# Patient Record
Sex: Female | Born: 1970 | Hispanic: No | Smoking: Former smoker
Health system: Southern US, Community
[De-identification: ages and names within clinical notes are randomized; demographics above are authoritative.]

## PROBLEM LIST (undated history)

## (undated) DIAGNOSIS — F329 Major depressive disorder, single episode, unspecified: Secondary | ICD-10-CM

## (undated) DIAGNOSIS — B019 Varicella without complication: Secondary | ICD-10-CM

## (undated) DIAGNOSIS — F419 Anxiety disorder, unspecified: Secondary | ICD-10-CM

## (undated) DIAGNOSIS — I2510 Atherosclerotic heart disease of native coronary artery without angina pectoris: Secondary | ICD-10-CM

## (undated) DIAGNOSIS — I519 Heart disease, unspecified: Secondary | ICD-10-CM

## (undated) DIAGNOSIS — F32A Depression, unspecified: Secondary | ICD-10-CM

## (undated) DIAGNOSIS — K219 Gastro-esophageal reflux disease without esophagitis: Secondary | ICD-10-CM

## (undated) HISTORY — DX: Anxiety disorder, unspecified: F41.9

## (undated) HISTORY — DX: Varicella without complication: B01.9

## (undated) HISTORY — DX: Major depressive disorder, single episode, unspecified: F32.9

## (undated) HISTORY — DX: Gastro-esophageal reflux disease without esophagitis: K21.9

## (undated) HISTORY — DX: Depression, unspecified: F32.A

---
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
## 2015-11-04 HISTORY — PX: UMBILICAL HERNIA REPAIR: SHX196

## 2017-06-05 ENCOUNTER — Ambulatory Visit (INDEPENDENT_AMBULATORY_CARE_PROVIDER_SITE_OTHER): Payer: BLUE CROSS/BLUE SHIELD | Admitting: Family Medicine

## 2017-06-05 ENCOUNTER — Encounter: Payer: Self-pay | Admitting: Family Medicine

## 2017-06-05 DIAGNOSIS — M25561 Pain in right knee: Secondary | ICD-10-CM | POA: Diagnosis not present

## 2017-06-05 DIAGNOSIS — F320 Major depressive disorder, single episode, mild: Secondary | ICD-10-CM | POA: Diagnosis not present

## 2017-06-05 DIAGNOSIS — G8929 Other chronic pain: Secondary | ICD-10-CM

## 2017-06-05 DIAGNOSIS — Z6834 Body mass index (BMI) 34.0-34.9, adult: Secondary | ICD-10-CM

## 2017-06-05 DIAGNOSIS — K219 Gastro-esophageal reflux disease without esophagitis: Secondary | ICD-10-CM | POA: Diagnosis not present

## 2017-06-05 DIAGNOSIS — E669 Obesity, unspecified: Secondary | ICD-10-CM

## 2017-06-05 MED ORDER — MELOXICAM 7.5 MG PO TABS: 7.5000 mg | ORAL_TABLET | Freq: Every day | ORAL | 1 refills | Status: DC

## 2017-06-05 MED ORDER — ESCITALOPRAM OXALATE 10 MG PO TABS: 10.0000 mg | ORAL_TABLET | Freq: Every day | ORAL | 1 refills | Status: DC

## 2017-06-05 MED ORDER — OMEPRAZOLE 40 MG PO CPDR: 40.0000 mg | DELAYED_RELEASE_CAPSULE | Freq: Every day | ORAL | 3 refills | Status: DC

## 2017-06-05 NOTE — Progress Notes (Signed)
HPI:   Taylor Black is a 46 y.o. female, who is here today to establish care.  Former PCP: N/A, moved from [place] a year ago. Last preventive routine visit: 2017, gyn preventive visit.   Chronic medical problems: GERD,tobacco use disorder, and depression Dx in 2010.   Concerns today: Knee pain.  Right knee pain intermittently for a year. Pain is exacerbated by prolonged sitting and walking. No stiffness.  She has not noted erythema. Intermittent edema. No Hx of trauma. Pain is achy, sharp, 7/10.  Occasionally thumbs IP pain. No limitation of ROM.  She has not tried OTC treatments consistently.    GERD: She is on OTC Prilosec 20 mg, which has helped with symptoms. + Hear burn, worse with certain foods. Denies abdominal pain, nausea, vomiting, changes in bowel habits, blood in stool or melena.  Insomnia: She works 3rd shift, up to 70 hours per week, works estra hours to be able to pay her bills. She also helps her daughter with her grandchild's care. Sleeps about 3-4 hours.  Hx of depression, she was on Lexapro and Xanax in the past but could not continue medications because she do not have insurance. She thinks Lexapro was helping. She denies suicidal thoughts.  She smokes marijuana.  She lives alone, close to her daughter.  She does not exercise regularly but tries to follow a healthy diet.    Review of Systems  Constitutional: Positive for fatigue. Negative for activity change, appetite change and fever.  HENT: Negative for mouth sores, nosebleeds and trouble swallowing.   Eyes: Negative for redness and visual disturbance.  Respiratory: Negative for cough, shortness of breath and wheezing.   Cardiovascular: Negative for chest pain, palpitations and leg swelling.  Gastrointestinal: Negative for abdominal pain, nausea and vomiting.       Negative for changes in bowel habits.  Endocrine: Negative for cold intolerance and heat intolerance.    Genitourinary: Negative for decreased urine volume and hematuria.  Musculoskeletal: Positive for arthralgias and joint swelling. Negative for gait problem.  Skin: Negative for rash.  Neurological: Negative for syncope, weakness, numbness and headaches.  Psychiatric/Behavioral: Positive for sleep disturbance. Negative for confusion and suicidal ideas. The patient is nervous/anxious.     No current outpatient prescriptions on file prior to visit.   No current facility-administered medications on file prior to visit.      Past Medical History:  Diagnosis Date  . Anxiety   . Chicken pox   . Depression   . GERD (gastroesophageal reflux disease)    Allergies  Allergen Reactions  . Morphine And Related     Family History  Problem Relation Age of Onset  . Cancer Mother   . Heart disease Father   . Stroke Father   . Hypertension Father   . Kidney disease Father   . Diabetes Father   . Alcohol abuse Father   . Depression Paternal Grandmother   . Kidney disease Paternal Grandmother     Social History   Social History  . Marital status: Legally Separated    Spouse name: N/A  . Number of children: N/A  . Years of education: N/A   Social History Main Topics  . Smoking status: Current Every Day Smoker  . Smokeless tobacco: Never Used  . Alcohol use Yes  . Drug use: Yes    Types: Marijuana  . Sexual activity: Not Currently   Other Topics Concern  . None   Social History Narrative  .  None    Vitals:   06/05/17 0851  BP: 126/80  Pulse: 99  Resp: 12   O2 sat at RA 96% Body mass index is 34.46 kg/m.  Physical Exam  Nursing note and vitals reviewed. Constitutional: She is oriented to person, place, and time. She appears well-developed. No distress.  HENT:  Head: Atraumatic.  Mouth/Throat: Oropharynx is clear and moist and mucous membranes are normal.  Eyes: Pupils are equal, round, and reactive to light. Conjunctivae and EOM are normal.  Neck: No tracheal  deviation present. No thyroid mass and no thyromegaly present.  Cardiovascular: Normal rate and regular rhythm.   No murmur heard. Pulses:      Dorsalis pedis pulses are 2+ on the right side, and 2+ on the left side.  Respiratory: Effort normal and breath sounds normal. No respiratory distress.  GI: Soft. She exhibits no mass. There is no hepatomegaly. There is no tenderness.  Musculoskeletal: She exhibits no edema or tenderness.       Right knee: She exhibits normal range of motion, no effusion and no deformity.  No signs of synovitis or significant deformities appreciated. Right knee: on inspection no effusion, erythema, or deformities. Valgus and varus stress normal, anterior and posterior drawer test negative. Patellar apprehension test negative. Crepitus with ROM, R>L.  Lymphadenopathy:    She has no cervical adenopathy.  Neurological: She is alert and oriented to person, place, and time. She has normal strength. Coordination and gait normal.  Skin: Skin is warm. No rash noted. No erythema.     Mild hyperpigmented, atrophic, post inflammatory scarring changes scattered on anterior aspect of lower extremities (pretibial and dorsum of feet).   Psychiatric: She has a normal mood and affect.  Well groomed, good eye contact.    ASSESSMENT AND PLAN:   Ms. Taylor Black was seen today for establish care.  Diagnoses and all orders for this visit:  Chronic pain of right knee  Most likely OA, other possible causes discussed. Meloxicam side effects discussed. OTC Asper cream Right knee X ray ordered, further recommendations will be given accordingly. Wt loss may help.   -     meloxicam (MOBIC) 7.5 MG tablet; Take 1 tablet (7.5 mg total) by mouth daily. -     DG Knee Complete 4 Views Right; Future  Gastroesophageal reflux disease, esophagitis presence not specified  Not well controlled. GERD precautions discussed. PPI side effects reviewed. Omeprazole increased from 20 mg to 40  mg.  -     omeprazole (PRILOSEC) 40 MG capsule; Take 1 capsule (40 mg total) by mouth daily.  Class 1 obesity without serious comorbidity with body mass index (BMI) of 34.0 to 34.9 in adult, unspecified obesity type  We discussed benefits of wt loss as well as adverse effects of obesity. Consistency with healthy diet and physical activity recommended. Daily brisk walking for 15-30 min as tolerated.   Depression, major, single episode, mild ([place])  She agrees with resuming Lexapro. Some side effects discussed. Instructed about warning signs. F/U in 8 weeks,before if needed.  -     escitalopram (LEXAPRO) 10 MG tablet; Take 1 tablet (10 mg total) by mouth daily.    In regard to skin lesions she states that she had intense pruritic lesions, she was evaluated by dermatologists ? Psoriasis. She used OTC topical treatments and coconut oil,these helped and lesions healed.  Encouraged smoking cessation as well as marijuana use.   [name]. [name], MD  [facility]. [place] office.

## 2017-06-05 NOTE — Patient Instructions (Signed)
A few things to remember from today's visit:   Chronic pain of right knee - Plan: meloxicam (MOBIC) 7.5 MG tablet, DG Knee Complete 4 Views Right  Depression, major, single episode, mild ([facility]) - Plan: escitalopram (LEXAPRO) 10 MG tablet  Gastroesophageal reflux disease, esophagitis presence not specified - Plan: omeprazole (PRILOSEC) 40 MG capsule    Avoid foods that make your symptoms worse, for example coffee, chocolate,pepermeint,alcohol, and greasy food. Raising the head of your bed about 6 inches may help with nocturnal symptoms.  Avoid tobacco use. Weight loss (if you are overweight). Avoid lying down for 3 hours after eating.  Instead 3 large meals daily try small and more frequent meals during the day.  Every medication have side effects and medications for GERD are not the exception.At this time I think benefit is greater than risk.    You should be evaluated immediately if bloody vomiting, bloody stools, black stools (like tar), difficulty swallowing, food gets stuck on the way down or choking when eating. Abnormal weight loss or severe abdominal pain.  If symptoms are not resolved sometimes endoscopy is necessary.  Please be sure medication list is accurate. If a new problem present, please set up appointment sooner than planned today.   Osteoarthritis is a chronic condition and gets worse with age.  The following may help:  Over the counter topical medications: Icy Hot or Asper cream with Lidocaine. Tai Chi or PT. Fall prevention. Avoid weight gain. Fish oil, over the counter Megared for example, 2 capsules daily.  Today we started Lexapro, this type of medications can increase suicidal risk. This is more prevalent among children,adolecents, and young adults with major depression or other psychiatric disorders. It can also make depression worse. Most common side effects are gastrointestinal, self limited after a few weeks: diarrhea, nausea, constipation  Or  diarrhea among some.  In general it is well tolerated. We will follow closely.

## 2017-06-07 ENCOUNTER — Encounter: Payer: Self-pay | Admitting: Family Medicine

## 2017-08-07 ENCOUNTER — Ambulatory Visit: Payer: BLUE CROSS/BLUE SHIELD | Admitting: Family Medicine

## 2017-08-16 ENCOUNTER — Other Ambulatory Visit: Payer: Self-pay | Admitting: Family Medicine

## 2017-08-16 DIAGNOSIS — G8929 Other chronic pain: Secondary | ICD-10-CM

## 2017-08-16 DIAGNOSIS — M25561 Pain in right knee: Principal | ICD-10-CM

## 2017-08-19 ENCOUNTER — Other Ambulatory Visit: Payer: Self-pay | Admitting: Family Medicine

## 2017-08-19 DIAGNOSIS — F320 Major depressive disorder, single episode, mild: Secondary | ICD-10-CM

## 2017-09-02 ENCOUNTER — Ambulatory Visit: Payer: BLUE CROSS/BLUE SHIELD | Admitting: Family Medicine

## 2017-10-05 ENCOUNTER — Encounter: Payer: Self-pay | Admitting: Emergency Medicine

## 2017-10-05 ENCOUNTER — Ambulatory Visit (INDEPENDENT_AMBULATORY_CARE_PROVIDER_SITE_OTHER): Payer: BLUE CROSS/BLUE SHIELD | Admitting: Family Medicine

## 2017-10-05 ENCOUNTER — Encounter: Payer: Self-pay | Admitting: Family Medicine

## 2017-10-05 VITALS — BP 130/90 | HR 91 | Temp 98.2°F | Wt 195.7 lb

## 2017-10-05 DIAGNOSIS — T59811A Toxic effect of smoke, accidental (unintentional), initial encounter: Secondary | ICD-10-CM

## 2017-10-05 DIAGNOSIS — J705 Respiratory conditions due to smoke inhalation: Secondary | ICD-10-CM | POA: Diagnosis not present

## 2017-10-05 DIAGNOSIS — R030 Elevated blood-pressure reading, without diagnosis of hypertension: Secondary | ICD-10-CM

## 2017-10-05 MED ORDER — IPRATROPIUM-ALBUTEROL 0.5-2.5 (3) MG/3ML IN SOLN
3.0000 mL | Freq: Once | RESPIRATORY_TRACT | Status: AC
  Administered 2017-10-05: 3 mL via RESPIRATORY_TRACT

## 2017-10-05 MED ORDER — IPRATROPIUM-ALBUTEROL 0.5-2.5 (3) MG/3ML IN SOLN: 3.0000 mL | Freq: Four times a day (QID) | RESPIRATORY_TRACT | Status: DC
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
## 2018-04-19 ENCOUNTER — Encounter (HOSPITAL_COMMUNITY): Payer: Self-pay | Admitting: *Deleted

## 2018-04-19 ENCOUNTER — Emergency Department (HOSPITAL_COMMUNITY)
Admission: EM | Admit: 2018-04-19 | Discharge: 2018-04-19 | Disposition: A | Discharge status: Home / Self Care | Payer: BLUE CROSS/BLUE SHIELD | Attending: Emergency Medicine | Admitting: Emergency Medicine

## 2018-04-19 DIAGNOSIS — B309 Viral conjunctivitis, unspecified: Secondary | ICD-10-CM | POA: Insufficient documentation

## 2018-04-19 DIAGNOSIS — F17200 Nicotine dependence, unspecified, uncomplicated: Secondary | ICD-10-CM | POA: Insufficient documentation

## 2018-04-19 DIAGNOSIS — H5711 Ocular pain, right eye: Secondary | ICD-10-CM | POA: Diagnosis present

## 2018-04-19 DIAGNOSIS — Z79899 Other long term (current) drug therapy: Secondary | ICD-10-CM | POA: Insufficient documentation

## 2018-04-19 MED ORDER — ERYTHROMYCIN 5 MG/GM OP OINT: TOPICAL_OINTMENT | OPHTHALMIC | 0 refills | Status: DC
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
## 2018-04-19 NOTE — ED Provider Notes (Signed)
[facility] DEPT Provider Note   CSN: [id number] Arrival date & time: 04/19/18  0750     History   Chief Complaint Chief Complaint  Patient presents with  . Eye Drainage    HPI Taylor Black is a 47 y.o. female.  HPI  47 y.o. female, presents to the Emergency Department today due to right eye itching. Notes symptoms since yesterday. Hx of pink eye contact at work. Denies pain. No fevers. No drainage. No redness to eyes. No n/V. No headache. No OTC medications attempted. Concern for pink eye due to contact. No other symptoms noted  Past Medical History:  Diagnosis Date  . Anxiety   . Chicken pox   . Depression   . GERD (gastroesophageal reflux disease)     Patient Active Problem List   Diagnosis Date Noted  . Knee pain, right 06/05/2017  . Depression, major, single episode, mild ([place]) 06/05/2017  . GERD (gastroesophageal reflux disease) 06/05/2017  . Class 1 obesity with body mass index (BMI) of 34.0 to 34.9 in adult 06/05/2017    Past Surgical History:  Procedure Laterality Date  . bowel obstruction  1989  . UMBILICAL HERNIA REPAIR  2017     OB History   None      Home Medications    Prior to Admission medications   Medication Sig Start Date End Date Taking? Authorizing Provider  escitalopram (LEXAPRO) 10 MG tablet TAKE 1 TABLET BY MOUTH DAILY 08/19/17   [name], [name], MD  meloxicam (MOBIC) 7.5 MG tablet TAKE 1 TABLET BY MOUTH DAILY 08/17/17   [name], [name], MD  omeprazole (PRILOSEC) 40 MG capsule Take 1 capsule (40 mg total) by mouth daily. 06/05/17   [name], [name], MD    Family History Family History  Problem Relation Age of Onset  . Cancer Mother   . Heart disease Father   . Stroke Father   . Hypertension Father   . Kidney disease Father   . Diabetes Father   . Alcohol abuse Father   . Depression Paternal Grandmother   . Kidney disease Paternal Grandmother     Social History Social History    Tobacco Use  . Smoking status: Current Every Day Smoker  . Smokeless tobacco: Never Used  Substance Use Topics  . Alcohol use: Yes  . Drug use: Yes    Types: Marijuana     Allergies   Morphine and related   Review of Systems Review of Systems ROS reviewed and all are negative for acute change except as noted in the HPI  Physical Exam Updated Vital Signs BP (!) 175/98 (BP Location: Right Arm)   Pulse 86   Temp 98.1 F (36.7 C) (Oral)   Resp 18   LMP 04/02/2018   SpO2 97%   Physical Exam  Constitutional: She is oriented to person, place, and time. She appears well-developed and well-nourished. No distress.  HENT:  Head: Normocephalic and atraumatic.  Right Ear: Tympanic membrane, external ear and ear canal normal.  Left Ear: Tympanic membrane, external ear and ear canal normal.  Nose: Nose normal.  Mouth/Throat: Uvula is midline, oropharynx is clear and moist and mucous membranes are normal. No trismus in the jaw. No oropharyngeal exudate, posterior oropharyngeal erythema or tonsillar abscesses.  Eyes: Pupils are equal, round, and reactive to light. EOM are normal. Right conjunctiva is not injected. Left conjunctiva is not injected.  Bilateral eyes without injected conjunctiva. No periorbital edema. No swelling. No discharge noted.  Neck: Normal range of motion. Neck supple. No tracheal deviation present.  Cardiovascular: Normal rate, regular rhythm, S1 normal, S2 normal, normal heart sounds, intact distal pulses and normal pulses.  Pulmonary/Chest: Effort normal and breath sounds normal. No respiratory distress. She has no decreased breath sounds. She has no wheezes. She has no rhonchi. She has no rales.  Abdominal: Normal appearance and bowel sounds are normal. There is no tenderness.  Musculoskeletal: Normal range of motion.  Neurological: She is alert and oriented to person, place, and time.  Skin: Skin is warm and dry.  Psychiatric: She has a normal mood and  affect. Her speech is normal and behavior is normal. Thought content normal.     ED Treatments / Results  Labs (all labs ordered are listed, but only abnormal results are displayed) Labs Reviewed - No data to display  EKG None  Radiology No results found.  Procedures Procedures (including critical care time)  Medications Ordered in ED Medications - No data to display   Initial Impression / Assessment and Plan / ED Course  I have reviewed the triage vital signs and the nursing notes.  Pertinent labs & imaging results that were available during my care of the patient were reviewed by me and considered in my medical decision making (see chart for details).   Final Clinical Impressions(s) / ED Diagnoses     {I have reviewed the relevant previous healthcare records.  {I obtained HPI from historian.   ED Course:  Assessment: No foreign bodies noted. No surrounding erythema, swelling, vision changes/loss suspicious for orbital or periorbital cellulitis. No signs of iritis. No signs of glaucoma. No symptoms of retinal detachment. No ophthalmologic emergency suspected. Suspect viral conjunctivitis. Will Rx ABX in case symptoms do develop. Counseled use with patient. Outpatient referral given in case of no improvement.   Disposition/Plan:  DC Home Additional Verbal discharge instructions given and discussed with patient.  Pt Instructed to f/u with PCP in the next week for evaluation and treatment of symptoms. Return precautions given Pt acknowledges and agrees with plan  Supervising Physician Lacretia Leigh, MD  Final diagnoses:  Viral conjunctivitis    ED Discharge Orders    None       Shary Decamp, PA-C 04/19/18 0830    Lacretia Leigh, MD 04/19/18 [phone]

## 2018-04-19 NOTE — ED Triage Notes (Signed)
Pt complains of itching around eye, drainage for the past 2 days. Pt states she was exposed to a patient with pink eye a few days ago.

## 2018-04-19 NOTE — ED Notes (Signed)
Bed: WTR7 Expected date:  Expected time:  Means of arrival:  Comments: 

## 2019-01-21 ENCOUNTER — Other Ambulatory Visit: Payer: Self-pay

## 2019-01-21 ENCOUNTER — Encounter (HOSPITAL_COMMUNITY): Payer: Self-pay

## 2019-01-21 ENCOUNTER — Ambulatory Visit (HOSPITAL_COMMUNITY)
Admission: EM | Admit: 2019-01-21 | Discharge: 2019-01-21 | Disposition: A | Discharge status: Home / Self Care | Payer: 59 | Attending: Family Medicine | Admitting: Family Medicine

## 2019-01-21 ENCOUNTER — Ambulatory Visit (INDEPENDENT_AMBULATORY_CARE_PROVIDER_SITE_OTHER): Payer: 59

## 2019-01-21 DIAGNOSIS — R05 Cough: Secondary | ICD-10-CM

## 2019-01-21 DIAGNOSIS — R509 Fever, unspecified: Secondary | ICD-10-CM

## 2019-01-21 DIAGNOSIS — J189 Pneumonia, unspecified organism: Secondary | ICD-10-CM

## 2019-01-21 MED ORDER — ALBUTEROL SULFATE HFA 108 (90 BASE) MCG/ACT IN AERS: 1.0000 | INHALATION_SPRAY | Freq: Four times a day (QID) | RESPIRATORY_TRACT | 0 refills | Status: DC | PRN

## 2019-01-21 MED ORDER — BENZONATATE 100 MG PO CAPS: 100.0000 mg | ORAL_CAPSULE | Freq: Three times a day (TID) | ORAL | 0 refills | Status: DC

## 2019-01-21 MED ORDER — AZITHROMYCIN 250 MG PO TABS: ORAL_TABLET | ORAL | 0 refills | Status: AC

## 2019-01-21 NOTE — Discharge Instructions (Signed)
Complete course of antibiotics.  Use of inhaler as needed for wheezing or shortness of breath.  Over the counter treatments as needed for symptoms.  Push fluids to ensure adequate hydration and keep secretions thin.  Please follow up with your PCP for BP recheck.  If worsening of symptoms, increased pain, shortness of breath , difficulty breathing or fevers without improvement please return or go to the ER.

## 2019-01-21 NOTE — ED Triage Notes (Signed)
Pt presents with chills, fatigue, nausea, diarrhea, and general body aches X 5 days.

## 2019-01-21 NOTE — ED Provider Notes (Signed)
[facility]    CSN: [id number] Arrival date & time: 01/21/19  1219     History   Chief Complaint Chief Complaint  Patient presents with  . Influenza    HPI Taylor Black is a 48 y.o. female.   Anusha presents with complaints of cough, fatigue with activity, night sweats, diarrhea, and headache. Started approximately 2 weeks ago although didn't have fever until three days ago when started to feel worse. No sore throat, no ear pain. No abdominal pain. Has approximately 2 loose stools a day, no blood. No shortness of breath . No vomiting. Has been eating and drinking. 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 Tylenol last night. Has used some OTC treatments which have helped some. Hx of anxiety, depression, gerd.     ROS per HPI, negative if not otherwise mentioned.      Past Medical History:  Diagnosis Date  . Anxiety   . Chicken pox   . Depression   . GERD (gastroesophageal reflux disease)     Patient Active Problem List   Diagnosis Date Noted  . Knee pain, right 06/05/2017  . Depression, major, single episode, mild ([place]) 06/05/2017  . GERD (gastroesophageal reflux disease) 06/05/2017  . Class 1 obesity with body mass index (BMI) of 34.0 to 34.9 in adult 06/05/2017    Past Surgical History:  Procedure Laterality Date  . bowel obstruction  1989  . UMBILICAL HERNIA REPAIR  2017    OB History   No obstetric history on file.      Home Medications    Prior to Admission medications   Medication Sig Start Date End Date Taking? Authorizing Provider  albuterol (PROAIR HFA) 108 (90 Base) MCG/ACT inhaler Inhale 1-2 puffs into the lungs every 6 (six) hours as needed for wheezing or shortness of breath. 01/21/19   Faylinn Schwenn, Malachy Moan, NP  azithromycin (ZITHROMAX) 250 MG tablet Take 2 tablets (500 mg total) by mouth daily for 1 day, THEN 1 tablet (250 mg total) daily for 4  days. 01/21/19 01/26/19  Zigmund Gottron, NP  benzonatate (TESSALON) 100 MG capsule Take 1 capsule (100 mg total) by mouth every 8 (eight) hours. 01/21/19   Zigmund Gottron, NP  erythromycin ophthalmic ointment Place a 1/2 inch ribbon of ointment into the lower eyelid. FOUR times per day for 5-7 days 04/19/18   Shary Decamp, PA-C  omeprazole (PRILOSEC) 40 MG capsule Take 1 capsule (40 mg total) by mouth daily. 06/05/17   [name], [name], MD    Family History Family History  Problem Relation Age of Onset  . Cancer Mother   . Heart disease Father   . Stroke Father   . Hypertension Father   . Kidney disease Father   . Diabetes Father   . Alcohol abuse Father   . Depression Paternal Grandmother   . Kidney disease Paternal Grandmother     Social History Social History   Tobacco Use  . Smoking status: Current Every Day Smoker  . Smokeless tobacco: Never Used  Substance Use Topics  . Alcohol use: Yes  . Drug use: Yes    Types: Marijuana     Allergies   Morphine and related   Review of Systems Review of Systems   Physical Exam Triage Vital Signs ED Triage Vitals  Enc Vitals Group     BP 01/21/19 1235 (!) 190/113  Pulse Rate 01/21/19 1235 100     Resp 01/21/19 1235 20     Temp 01/21/19 1235 99 F (37.2 C)     Temp Source 01/21/19 1235 Oral     SpO2 01/21/19 1235 96 %     Weight --      Height --      Head Circumference --      Peak Flow --      Pain Score 01/21/19 1236 5     Pain Loc --      Pain Edu? --      Excl. in [place]? --    No data found.  Updated Vital Signs BP (!) 150/91 (BP Location: Left Arm)   Pulse 100   Temp 99 F (37.2 C) (Oral)   Resp 20   SpO2 96%   Visual Acuity Right Eye Distance:   Left Eye Distance:   Bilateral Distance:    Right Eye Near:   Left Eye Near:    Bilateral Near:     Physical Exam Constitutional:      General: She is not in acute distress.    Appearance: She is well-developed.  HENT:     Head: Normocephalic and  atraumatic.     Right Ear: Tympanic membrane, ear canal and external ear normal.     Left Ear: Tympanic membrane, ear canal and external ear normal.     Nose: Nose normal.     Mouth/Throat:     Pharynx: Uvula midline.     Tonsils: No tonsillar exudate.  Eyes:     Conjunctiva/sclera: Conjunctivae normal.     Pupils: Pupils are equal, round, and reactive to light.  Cardiovascular:     Rate and Rhythm: Normal rate and regular rhythm.     Heart sounds: Normal heart sounds.  Pulmonary:     Effort: Pulmonary effort is normal.     Breath sounds: Examination of the right-lower field reveals decreased breath sounds. Examination of the left-lower field reveals decreased breath sounds. Decreased breath sounds present.     Comments: Occasional congested cough noted  Skin:    General: Skin is warm and dry.  Neurological:     Mental Status: She is alert and oriented to person, place, and time.      UC Treatments / Results  Labs (all labs ordered are listed, but only abnormal results are displayed) Labs Reviewed - No data to display  EKG None  Radiology Dg Chest 2 View  Result Date: 01/21/2019 CLINICAL DATA:  Cough and fever. EXAM: CHEST - 2 VIEW COMPARISON:  No recent prior. FINDINGS: Mediastinum hilar structures normal. Heart size normal. Low lung volumes. Mild bilateral interstitial prominence. Mild pneumonitis can not be excluded. No pleural effusion or pneumothorax. Degenerative change thoracic spine. IMPRESSION: Mild bilateral interstitial prominence. Mild pneumonitis can not be excluded. Electronically Signed   By: Marcello Moores  Register   On: 01/21/2019 13:11    Procedures Procedures (including critical care time)  Medications Ordered in UC Medications - No data to display  Initial Impression / Assessment and Plan / UC Course  I have reviewed the triage vital signs and the nursing notes.  Pertinent labs & imaging results that were available during my care of the patient were  reviewed by me and considered in my medical decision making (see chart for details).     Xray is concerning for pneumonia. Otherwise healthy, will cover with azithromycin. Inhaler prn. Return precautions provided. Encouraged follow up with PCP for recheck  and BP recheck. Patient verbalized understanding and agreeable to plan.   Final Clinical Impressions(s) / UC Diagnoses   Final diagnoses:  Community acquired pneumonia, unspecified laterality     Discharge Instructions     Complete course of antibiotics.  Use of inhaler as needed for wheezing or shortness of breath.  Over the counter treatments as needed for symptoms.  Push fluids to ensure adequate hydration and keep secretions thin.  Please follow up with your PCP for BP recheck.  If worsening of symptoms, increased pain, shortness of breath , difficulty breathing or fevers without improvement please return or go to the ER.     ED Prescriptions    Medication Sig Dispense Auth. Provider   albuterol (PROAIR HFA) 108 (90 Base) MCG/ACT inhaler Inhale 1-2 puffs into the lungs every 6 (six) hours as needed for wheezing or shortness of breath. 1 Inhaler Cheyne Boulden, Lanelle Bal B, NP   azithromycin (ZITHROMAX) 250 MG tablet Take 2 tablets (500 mg total) by mouth daily for 1 day, THEN 1 tablet (250 mg total) daily for 4 days. 6 tablet Augusto Gamble B, NP   benzonatate (TESSALON) 100 MG capsule Take 1 capsule (100 mg total) by mouth every 8 (eight) hours. 21 capsule Zigmund Gottron, NP     Controlled Substance Prescriptions [place] Controlled Substance Registry consulted? Not Applicable   Zigmund Gottron, NP 01/21/19 1322

## 2019-06-12 ENCOUNTER — Other Ambulatory Visit: Payer: Self-pay

## 2019-06-12 ENCOUNTER — Inpatient Hospital Stay (HOSPITAL_COMMUNITY)
Admission: EM | Admit: 2019-06-12 | Discharge: 2019-06-13 | DRG: 287 | Disposition: A | Discharge status: Home / Self Care | Payer: 59 | Attending: Cardiovascular Disease | Admitting: Cardiovascular Disease

## 2019-06-12 ENCOUNTER — Encounter (HOSPITAL_COMMUNITY): Payer: Self-pay | Admitting: Emergency Medicine

## 2019-06-12 ENCOUNTER — Emergency Department (HOSPITAL_COMMUNITY): Payer: 59

## 2019-06-12 DIAGNOSIS — I200 Unstable angina: Secondary | ICD-10-CM | POA: Diagnosis not present

## 2019-06-12 DIAGNOSIS — Z841 Family history of disorders of kidney and ureter: Secondary | ICD-10-CM

## 2019-06-12 DIAGNOSIS — E669 Obesity, unspecified: Secondary | ICD-10-CM | POA: Diagnosis present

## 2019-06-12 DIAGNOSIS — F17200 Nicotine dependence, unspecified, uncomplicated: Secondary | ICD-10-CM | POA: Diagnosis present

## 2019-06-12 DIAGNOSIS — I214 Non-ST elevation (NSTEMI) myocardial infarction: Secondary | ICD-10-CM | POA: Insufficient documentation

## 2019-06-12 DIAGNOSIS — Z823 Family history of stroke: Secondary | ICD-10-CM | POA: Diagnosis not present

## 2019-06-12 DIAGNOSIS — Z6839 Body mass index (BMI) 39.0-39.9, adult: Secondary | ICD-10-CM

## 2019-06-12 DIAGNOSIS — K219 Gastro-esophageal reflux disease without esophagitis: Secondary | ICD-10-CM | POA: Diagnosis present

## 2019-06-12 DIAGNOSIS — Z8249 Family history of ischemic heart disease and other diseases of the circulatory system: Secondary | ICD-10-CM

## 2019-06-12 DIAGNOSIS — D649 Anemia, unspecified: Secondary | ICD-10-CM | POA: Diagnosis present

## 2019-06-12 DIAGNOSIS — Z720 Tobacco use: Secondary | ICD-10-CM | POA: Diagnosis not present

## 2019-06-12 DIAGNOSIS — R079 Chest pain, unspecified: Secondary | ICD-10-CM | POA: Diagnosis present

## 2019-06-12 DIAGNOSIS — Z809 Family history of malignant neoplasm, unspecified: Secondary | ICD-10-CM | POA: Diagnosis not present

## 2019-06-12 DIAGNOSIS — I25110 Atherosclerotic heart disease of native coronary artery with unstable angina pectoris: Secondary | ICD-10-CM | POA: Diagnosis present

## 2019-06-12 DIAGNOSIS — Z811 Family history of alcohol abuse and dependence: Secondary | ICD-10-CM | POA: Diagnosis not present

## 2019-06-12 DIAGNOSIS — Z885 Allergy status to narcotic agent status: Secondary | ICD-10-CM | POA: Diagnosis not present

## 2019-06-12 DIAGNOSIS — I2510 Atherosclerotic heart disease of native coronary artery without angina pectoris: Secondary | ICD-10-CM | POA: Diagnosis present

## 2019-06-12 DIAGNOSIS — R7989 Other specified abnormal findings of blood chemistry: Secondary | ICD-10-CM

## 2019-06-12 DIAGNOSIS — I519 Heart disease, unspecified: Secondary | ICD-10-CM | POA: Diagnosis present

## 2019-06-12 DIAGNOSIS — Z818 Family history of other mental and behavioral disorders: Secondary | ICD-10-CM

## 2019-06-12 DIAGNOSIS — Z833 Family history of diabetes mellitus: Secondary | ICD-10-CM | POA: Diagnosis not present

## 2019-06-12 DIAGNOSIS — I10 Essential (primary) hypertension: Secondary | ICD-10-CM | POA: Diagnosis present

## 2019-06-12 DIAGNOSIS — Z20828 Contact with and (suspected) exposure to other viral communicable diseases: Secondary | ICD-10-CM | POA: Diagnosis present

## 2019-06-12 LAB — CBC
HCT: 37 % (ref 36.0–46.0)
Hemoglobin: 11 g/dL — ABNORMAL LOW (ref 12.0–15.0)
MCH: 22.7 pg — ABNORMAL LOW (ref 26.0–34.0)
MCHC: 29.7 g/dL — ABNORMAL LOW (ref 30.0–36.0)
MCV: 76.4 fL — ABNORMAL LOW (ref 80.0–100.0)
Platelets: 346 10*3/uL (ref 150–400)
RBC: 4.84 MIL/uL (ref 3.87–5.11)
RDW: 18.5 % — ABNORMAL HIGH (ref 11.5–15.5)
WBC: 8.1 10*3/uL (ref 4.0–10.5)
nRBC: 0 % (ref 0.0–0.2)

## 2019-06-12 LAB — BASIC METABOLIC PANEL
Anion gap: 11 (ref 5–15)
BUN: 14 mg/dL (ref 6–20)
CO2: 23 mmol/L (ref 22–32)
Calcium: 8.7 mg/dL — ABNORMAL LOW (ref 8.9–10.3)
Chloride: 106 mmol/L (ref 98–111)
Creatinine, Ser: 0.92 mg/dL (ref 0.44–1.00)
GFR calc Af Amer: >60 mL/min (ref >60)
GFR calc non Af Amer: >60 mL/min (ref >60)
Glucose, Bld: 122 mg/dL — ABNORMAL HIGH (ref 70–99)
Potassium: 4.2 mmol/L (ref 3.5–5.1)
Sodium: 140 mmol/L (ref 135–145)

## 2019-06-12 LAB — HEPARIN LEVEL (UNFRACTIONATED)
Heparin Unfractionated: 0.28 IU/mL — ABNORMAL LOW (ref 0.30–0.70)
Heparin Unfractionated: 0.28 IU/mL — ABNORMAL LOW (ref 0.30–0.70)

## 2019-06-12 LAB — TROPONIN I (HIGH SENSITIVITY)
Troponin I (High Sensitivity): 65 ng/L — ABNORMAL HIGH (ref <18)
Troponin I (High Sensitivity): 79 ng/L — ABNORMAL HIGH (ref <18)

## 2019-06-12 LAB — I-STAT BETA HCG BLOOD, ED (MC, WL, AP ONLY): I-stat hCG, quantitative: <5 m[IU]/mL (ref <5)

## 2019-06-12 LAB — SARS CORONAVIRUS 2 BY RT PCR (HOSPITAL ORDER, PERFORMED IN ~~LOC~~ HOSPITAL LAB): SARS Coronavirus 2: NEGATIVE

## 2019-06-12 MED ORDER — HEPARIN BOLUS VIA INFUSION
4000.0000 [IU] | Freq: Once | INTRAVENOUS | Status: AC
  Administered 2019-06-12: 10:00:00 4000 [IU] via INTRAVENOUS
  Filled 2019-06-12: qty 4000

## 2019-06-12 MED ORDER — ASPIRIN 81 MG PO CHEW
324.0000 mg | CHEWABLE_TABLET | Freq: Once | ORAL | Status: AC
  Administered 2019-06-12: 09:00:00 324 mg via ORAL
  Filled 2019-06-12: qty 4

## 2019-06-12 MED ORDER — SODIUM CHLORIDE 0.9% FLUSH: 3.0000 mL | INTRAVENOUS | Status: DC | PRN

## 2019-06-12 MED ORDER — SODIUM CHLORIDE 0.9% FLUSH
3.0000 mL | Freq: Once | INTRAVENOUS | Status: AC
  Administered 2019-06-12: 10:00:00 3 mL via INTRAVENOUS

## 2019-06-12 MED ORDER — PANTOPRAZOLE SODIUM 40 MG PO TBEC
40.0000 mg | DELAYED_RELEASE_TABLET | Freq: Every day | ORAL | Status: DC
  Administered 2019-06-12 – 2019-06-13 (×2): 40 mg via ORAL
  Filled 2019-06-12 (×2): qty 1

## 2019-06-12 MED ORDER — METOPROLOL TARTRATE 25 MG PO TABS
25.0000 mg | ORAL_TABLET | Freq: Two times a day (BID) | ORAL | Status: DC
  Administered 2019-06-12 (×2): 25 mg via ORAL
  Filled 2019-06-12 (×2): qty 1

## 2019-06-12 MED ORDER — HEPARIN (PORCINE) 25000 UT/250ML-% IV SOLN
1000.0000 [IU]/h | INTRAVENOUS | Status: DC
  Administered 2019-06-12: 800 [IU]/h via INTRAVENOUS
  Filled 2019-06-12: qty 250

## 2019-06-12 MED ORDER — ACETAMINOPHEN 325 MG PO TABS
650.0000 mg | ORAL_TABLET | ORAL | Status: DC | PRN
  Administered 2019-06-12 (×2): 650 mg via ORAL
  Filled 2019-06-12 (×2): qty 2

## 2019-06-12 MED ORDER — ONDANSETRON HCL 4 MG/2ML IJ SOLN: 4.0000 mg | Freq: Four times a day (QID) | INTRAMUSCULAR | Status: DC | PRN

## 2019-06-12 MED ORDER — NITROGLYCERIN 0.4 MG SL SUBL: 0.4000 mg | SUBLINGUAL_TABLET | SUBLINGUAL | Status: DC | PRN

## 2019-06-12 MED ORDER — SODIUM CHLORIDE 0.9% FLUSH
3.0000 mL | Freq: Two times a day (BID) | INTRAVENOUS | Status: DC
  Administered 2019-06-12 – 2019-06-13 (×2): 3 mL via INTRAVENOUS

## 2019-06-12 MED ORDER — ASPIRIN EC 81 MG PO TBEC
81.0000 mg | DELAYED_RELEASE_TABLET | Freq: Every day | ORAL | Status: DC
  Administered 2019-06-13: 08:00:00 81 mg via ORAL
  Filled 2019-06-12 (×2): qty 1

## 2019-06-12 MED ORDER — SODIUM CHLORIDE 0.9 % IV SOLN
250.0000 mL | INTRAVENOUS | Status: DC | PRN
  Administered 2019-06-13: 08:00:00 250 mL via INTRAVENOUS

## 2019-06-12 MED ORDER — SODIUM CHLORIDE 0.9 % WEIGHT BASED INFUSION
1.0000 mL/kg/h | INTRAVENOUS | Status: DC
  Administered 2019-06-13: 08:00:00 1 mL/kg/h via INTRAVENOUS

## 2019-06-12 MED ORDER — NITROGLYCERIN 2 % TD OINT
1.0000 [in_us] | TOPICAL_OINTMENT | Freq: Once | TRANSDERMAL | Status: AC
  Administered 2019-06-12: 09:00:00 1 [in_us] via TOPICAL
  Filled 2019-06-12: qty 1

## 2019-06-12 MED ORDER — LOSARTAN POTASSIUM 25 MG PO TABS
25.0000 mg | ORAL_TABLET | Freq: Every day | ORAL | Status: DC
  Administered 2019-06-12: 13:00:00 25 mg via ORAL
  Filled 2019-06-12: qty 1

## 2019-06-12 MED ORDER — SODIUM CHLORIDE 0.9 % WEIGHT BASED INFUSION: 3.0000 mL/kg/h | INTRAVENOUS | Status: AC

## 2019-06-12 NOTE — ED Notes (Signed)
ED TO INPATIENT HANDOFF REPORT  ED Nurse Name and Phone #: Taylor Locus, RN  S Name/Age/Gender Taylor Black [name] 48 y.o. female Room/Bed: 021C/021C  Code Status   Code Status: Not on file  Home/SNF/Other Home Patient oriented to: self, place, time and situation Is this baseline? Yes   Triage Complete: Triage complete  Chief Complaint CP  Triage Note Pt here for eval of intermittent chest pain to mid chest radiating to the left arm that has gotten worse. Pain is currently 7/10. Denies SOB/ N/V/ dizziness.    Allergies Allergies  Allergen Reactions  . Morphine And Related     Level of Care/Admitting Diagnosis ED Disposition    ED Disposition Condition [facility]: [place] [100100]  Level of Care: Telemetry Cardiac [103]  Covid Evaluation: Asymptomatic Screening Protocol (No Symptoms)  Diagnosis: NSTEMI (non-ST elevated myocardial infarction) [facility]) [706237]  Admitting Physician: Carron Brazen  Attending Physician: [name] [[id number]]  Estimated length of stay: past midnight tomorrow  Certification:: I certify this patient will need inpatient services for at least 2 midnights  PT Class (Do Not Modify): Inpatient [101]  PT Acc Code (Do Not Modify): Private [1]       B Medical/Surgery History Past Medical History:  Diagnosis Date  . Anxiety   . Chicken pox   . Depression   . GERD (gastroesophageal reflux disease)    Past Surgical History:  Procedure Laterality Date  . bowel obstruction  1989  . UMBILICAL HERNIA REPAIR  2017     A IV Location/Drains/Wounds Patient Lines/Drains/Airways Status   Active Line/Drains/Airways    Name:   Placement date:   Placement time:   Site:   Days:   Peripheral IV 06/12/19 Right;Anterior Forearm   06/12/19    0919    Forearm   less than 1          Intake/Output Last 24 hours No intake or output data in the 24 hours ending 06/12/19 1123  Labs/Imaging Results for  orders placed or performed during the hospital encounter of 06/12/19 (from the past 48 hour(s))  Basic metabolic panel     Status: Abnormal   Collection Time: 06/12/19  7:20 AM  Result Value Ref Range   Sodium 140 135 - 145 mmol/L   Potassium 4.2 3.5 - 5.1 mmol/L   Chloride 106 98 - 111 mmol/L   CO2 23 22 - 32 mmol/L   Glucose, Bld 122 (H) 70 - 99 mg/dL   BUN 14 6 - 20 mg/dL   Creatinine, Ser 0.92 0.44 - 1.00 mg/dL   Calcium 8.7 (L) 8.9 - 10.3 mg/dL   GFR calc non Af Amer >60 >60 mL/min   GFR calc Af Amer >60 >60 mL/min   Anion gap 11 5 - 15    Comment: Performed at [facility], [place] [address] [place], [place] [address]  CBC     Status: Abnormal   Collection Time: 06/12/19  7:20 AM  Result Value Ref Range   WBC 8.1 4.0 - 10.5 K/uL   RBC 4.84 3.87 - 5.11 MIL/uL   Hemoglobin 11.0 (L) 12.0 - 15.0 g/dL   HCT 37.0 36.0 - 46.0 %   MCV 76.4 (L) 80.0 - 100.0 fL   MCH 22.7 (L) 26.0 - 34.0 pg   MCHC 29.7 (L) 30.0 - 36.0 g/dL   RDW 18.5 (H) 11.5 - 15.5 %   Platelets 346 150 - 400 K/uL  nRBC 0.0 0.0 - 0.2 %    Comment: Performed at [facility], [place] [address] [place], [place] [address]  Troponin I (High Sensitivity)     Status: Abnormal   Collection Time: 06/12/19  7:20 AM  Result Value Ref Range   Troponin I (High Sensitivity) 65 (H) <18 ng/L    Comment: (NOTE) Elevated high sensitivity troponin I (hsTnI) values and significant  changes across serial measurements may suggest ACS but many other  chronic and acute conditions are known to elevate hsTnI results.  Refer to the "Links" section for chest pain algorithms and additional  guidance. Performed at [facility], [place] [address] [place], [place] [address]   I-Stat beta hCG blood, ED     Status: None   Collection Time: 06/12/19  7:57 AM  Result Value Ref Range   I-stat hCG, quantitative <5.0 <5 mIU/mL   Comment 3            Comment:   GEST. AGE      CONC.  (mIU/mL)   <=1 WEEK        5 - 50     2 WEEKS        50 - 500     3 WEEKS       100 - 10,000     4 WEEKS     1,000 - 30,000        FEMALE AND NON-PREGNANT FEMALE:     LESS THAN 5 mIU/mL   Troponin I (High Sensitivity)     Status: Abnormal   Collection Time: 06/12/19  9:22 AM  Result Value Ref Range   Troponin I (High Sensitivity) 79 (H) <18 ng/L    Comment: (NOTE) Elevated high sensitivity troponin I (hsTnI) values and significant  changes across serial measurements may suggest ACS but many other  chronic and acute conditions are known to elevate hsTnI results.  Refer to the "Links" section for chest pain algorithms and additional  guidance. Performed at [facility], [place] [address], [place], [place] [address]    Dg Chest 2 View  Result Date: 06/12/2019 CLINICAL DATA:  48 year old female with a history of left-sided chest pain EXAM: CHEST - 2 VIEW COMPARISON:  January 21, 2011 FINDINGS: Cardiomediastinal silhouette unchanged in size and contour. No evidence of central vascular congestion. No interlobular septal thickening. No pneumothorax or pleural effusion. No confluent airspace disease. Degenerative changes of the spine. IMPRESSION: Negative for acute cardiopulmonary disease Electronically Signed   By: Corrie Mckusick D.O.   On: 06/12/2019 08:08    Pending Labs Unresulted Labs (From admission, onward)    Start     Ordered   06/12/19 1530  Heparin level (unfractionated)  Once-Timed,   STAT     06/12/19 0907   06/12/19 0844  SARS Coronavirus 2 [facility] order, Performed in [facility] hospital lab) Nasopharyngeal Nasopharyngeal Swab  (Symptomatic/High Risk of Exposure/Tier 1 Patients Labs with Precautions)  Once,   STAT    Question Answer Comment  Is this test for diagnosis or screening Diagnosis of ill patient   Symptomatic for COVID-19 as defined by CDC No   Hospitalized for COVID-19 No   Admitted to ICU for COVID-19 No   Previously tested for COVID-19 No   Resident in a congregate (group) care setting No   Employed in  healthcare setting No   Pregnant No      06/12/19 0844   Signed and Held  HIV antibody (  Routine Testing)  Once,   R     Signed and Held   Signed and Held  Lipid panel  Tomorrow morning,   R     Signed and Held   Signed and Held  Hemoglobin A1c  Tomorrow morning,   R     Signed and Held   Signed and Held  Basic metabolic panel  Tomorrow morning,   R     Signed and Held          Vitals/Pain Today's Vitals   06/12/19 0919 06/12/19 0945 06/12/19 1015 06/12/19 1045  BP: (!) 152/96 (!) 151/88 (!) 157/93 (!) 151/84  Pulse: 73 73  73  Resp: (!) 28 20 15  (!) 25  Temp:      TempSrc:      SpO2: 98% 98%  98%  Weight:      Height:      PainSc:        Isolation Precautions Airborne and Contact precautions  Medications Medications  heparin ADULT infusion 100 units/mL (25000 units/266mL sodium chloride 0.45%) (800 Units/hr Intravenous New Bag/Given 06/12/19 1013)  sodium chloride flush (NS) 0.9 % injection 3 mL (3 mLs Intravenous Given 06/12/19 1002)  aspirin chewable tablet 324 mg (324 mg Oral Given 06/12/19 0900)  nitroGLYCERIN (NITROGLYN) 2 % ointment 1 inch (1 inch Topical Given 06/12/19 0901)  heparin bolus via infusion 4,000 Units (4,000 Units Intravenous Bolus from Bag 06/12/19 1013)    Mobility walks Low fall risk   Focused Assessments Cardiac Assessment Handoff:  Cardiac Rhythm: Normal sinus rhythm No results found for: CKTOTAL, CKMB, CKMBINDEX, TROPONINI No results found for: DDIMER      R Recommendations: See Admitting Provider Note  Report given to:   Additional Notes:

## 2019-06-12 NOTE — ED Triage Notes (Signed)
Pt here for eval of intermittent chest pain to mid chest radiating to the left arm that has gotten worse. Pain is currently 7/10. Denies SOB/ N/V/ dizziness.

## 2019-06-12 NOTE — Progress Notes (Signed)
ANTICOAGULATION CONSULT NOTE - Initial Consult  Pharmacy Consult for Heparin Indication: chest pain/ACS  Allergies  Allergen Reactions  . Morphine And Related     Patient Measurements: Height: 5' (152.4 cm) Weight: 200 lb (90.7 kg) IBW/kg (Calculated) : 45.5 Heparin Dosing Weight: 67 kg  Vital Signs: Temp: 97.7 F (36.5 C) (08/09 0724) Temp Source: Oral (08/09 0724) BP: 152/96 (08/09 0919) Pulse Rate: 73 (08/09 0919)  Labs: Recent Labs    06/12/19 0720  HGB 11.0*  HCT 37.0  PLT 346  CREATININE 0.92  TROPONINIHS 65*    Estimated Creatinine Clearance: 75.1 mL/min (by C-G formula based on SCr of 0.92 mg/dL).   Medical History: Past Medical History:  Diagnosis Date  . Anxiety   . Chicken pox   . Depression   . GERD (gastroesophageal reflux disease)     Medications:  (Not in a hospital admission)   Assessment: 48 yo F presenting with intermittent chest pain/unstable angina. No anticoaguation PTA. Pharmacy has been consulted to dose heparin.   Hgb 11.0, CBC stable. No signs of bleeding.   Goal of Therapy:  Heparin level 0.3-0.7 units/ml Monitor platelets by anticoagulation protocol: Yes   Plan:  Give 4000 units bolus x 1 Start heparin infusion at 800 units/hr Check anti-Xa level in 6 hours and daily while on heparin Continue to monitor H&H and platelets  Richardine Service, PharmD PGY1 Pharmacy Resident Phone: [phone] 06/12/2019  10:00 AM  Please check [email or web address] for unit-specific pharmacy phone numbers.

## 2019-06-12 NOTE — ED Provider Notes (Signed)
[facility] EMERGENCY DEPARTMENT Provider Note   CSN: [id number] Arrival date & time: 06/12/19  3500     History   Chief Complaint Chief Complaint  Patient presents with  . Chest Pain    HPI Taylor Black is a 48 y.o. female.   48yF with CP. Intermittent. First episode Thursday while at rest. Pressure in center/left chest. Lasted 5-10 minutes and then resolved. Since then she has had several repeat episodes but hasn't noticed clear exacerbating/relieving factors. Sometimes she will also have nausea with the symptoms and sometimes also numbness in L arm/hand. Most recently had these symptoms shortly after arriving to the ED today but none currently. She is a smoker, obese and previously told that has HTN although not currently on medications. No prior diagnosis of CAD. Does not have a cardiologist.    Past Medical History:  Diagnosis Date  . Anxiety   . Chicken pox   . Depression   . GERD (gastroesophageal reflux disease)     Patient Active Problem List   Diagnosis Date Noted  . Knee pain, right 06/05/2017  . Depression, major, single episode, mild ([place]) 06/05/2017  . GERD (gastroesophageal reflux disease) 06/05/2017  . Class 1 obesity with body mass index (BMI) of 34.0 to 34.9 in adult 06/05/2017    Past Surgical History:  Procedure Laterality Date  . bowel obstruction  1989  . UMBILICAL HERNIA REPAIR  2017     OB History   No obstetric history on file.      Home Medications    Prior to Admission medications   Medication Sig Start Date End Date Taking? Authorizing Provider  albuterol (PROAIR HFA) 108 (90 Base) MCG/ACT inhaler Inhale 1-2 puffs into the lungs every 6 (six) hours as needed for wheezing or shortness of breath. 01/21/19   Zigmund Gottron, NP  benzonatate (TESSALON) 100 MG capsule Take 1 capsule (100 mg total) by mouth every 8 (eight) hours. 01/21/19   Zigmund Gottron, NP  erythromycin ophthalmic ointment Place a 1/2 inch  ribbon of ointment into the lower eyelid. FOUR times per day for 5-7 days 04/19/18   Shary Decamp, PA-C  omeprazole (PRILOSEC) 40 MG capsule Take 1 capsule (40 mg total) by mouth daily. 06/05/17   [name], [name], MD    Family History Family History  Problem Relation Age of Onset  . Cancer Mother   . Heart disease Father   . Stroke Father   . Hypertension Father   . Kidney disease Father   . Diabetes Father   . Alcohol abuse Father   . Depression Paternal Grandmother   . Kidney disease Paternal Grandmother     Social History Social History   Tobacco Use  . Smoking status: Current Every Day Smoker  . Smokeless tobacco: Never Used  Substance Use Topics  . Alcohol use: Yes  . Drug use: Yes    Types: Marijuana     Allergies   Morphine and related   Review of Systems Review of Systems  All systems reviewed and negative, other than as noted in HPI.  Physical Exam Updated Vital Signs BP (!) 175/105 (BP Location: Right Arm)   Pulse 73   Temp 97.7 F (36.5 C) (Oral)   Resp 20   LMP 06/05/2019   SpO2 98%   Physical Exam Vitals signs and nursing note reviewed.  Constitutional:      General: She is not in acute distress.    Appearance: She is well-developed.  HENT:     Head: Normocephalic and atraumatic.  Eyes:     General:        Right eye: No discharge.        Left eye: No discharge.     Conjunctiva/sclera: Conjunctivae normal.  Neck:     Musculoskeletal: Neck supple.  Cardiovascular:     Rate and Rhythm: Normal rate and regular rhythm.     Heart sounds: Normal heart sounds. No murmur. No friction rub. No gallop.   Pulmonary:     Effort: Pulmonary effort is normal. No respiratory distress.     Breath sounds: Normal breath sounds.  Abdominal:     General: There is no distension.     Palpations: Abdomen is soft.     Tenderness: There is no abdominal tenderness.  Musculoskeletal:        General: No tenderness.     Comments: Lower extremities symmetric as  compared to each other. No calf tenderness. Negative Homan's. No palpable cords.   Skin:    General: Skin is warm and dry.  Neurological:     Mental Status: She is alert.  Psychiatric:        Behavior: Behavior normal.        Thought Content: Thought content normal.      ED Treatments / Results  Labs (all labs ordered are listed, but only abnormal results are displayed) Labs Reviewed  BASIC METABOLIC PANEL - Abnormal; Notable for the following components:      Result Value   Glucose, Bld 122 (*)    Calcium 8.7 (*)    All other components within normal limits  CBC - Abnormal; Notable for the following components:   Hemoglobin 11.0 (*)    MCV 76.4 (*)    MCH 22.7 (*)    MCHC 29.7 (*)    RDW 18.5 (*)    All other components within normal limits  HEPARIN LEVEL (UNFRACTIONATED) - Abnormal; Notable for the following components:   Heparin Unfractionated 0.28 (*)    All other components within normal limits  HEPARIN LEVEL (UNFRACTIONATED) - Abnormal; Notable for the following components:   Heparin Unfractionated 0.28 (*)    All other components within normal limits  HEMOGLOBIN A1C - Abnormal; Notable for the following components:   Hgb A1c MFr Bld 5.9 (*)    All other components within normal limits  BASIC METABOLIC PANEL - Abnormal; Notable for the following components:   Glucose, Bld 107 (*)    All other components within normal limits  CBC - Abnormal; Notable for the following components:   RBC 5.37 (*)    MCV 76.4 (*)    MCH 22.5 (*)    MCHC 29.5 (*)    RDW 18.6 (*)    All other components within normal limits  LIPID PANEL - Abnormal; Notable for the following components:   LDL Cholesterol 103 (*)    All other components within normal limits  TROPONIN I (HIGH SENSITIVITY) - Abnormal; Notable for the following components:   Troponin I (High Sensitivity) 65 (*)    All other components within normal limits  TROPONIN I (HIGH SENSITIVITY) - Abnormal; Notable for the  following components:   Troponin I (High Sensitivity) 79 (*)    All other components within normal limits  SARS CORONAVIRUS 2 (HOSPITAL ORDER, [facility])  MRSA PCR SCREENING  SURGICAL PCR SCREEN  HIV ANTIBODY (ROUTINE TESTING W REFLEX)  HEPARIN LEVEL (UNFRACTIONATED)  I-STAT BETA HCG BLOOD,  ED [facility], [facility], [facility] ONLY)    EKG EKG Interpretation  Date/Time:  Sunday June 12 2019 07:24:01 EDT Ventricular Rate:  78 PR Interval:  174 QRS Duration: 90 QT Interval:  420 QTC Calculation: 478 R Axis:   2 Text Interpretation:  Normal sinus rhythm Moderate voltage criteria for LVH, may be normal variant ST & T wave abnormality, consider anterolateral ischemia Prolonged QT Abnormal ECG Confirmed by Virgel Manifold [phone]) on 06/12/2019 8:09:06 AM   Radiology Dg Chest 2 View  Result Date: 06/12/2019 CLINICAL DATA:  48 year old female with a history of left-sided chest pain EXAM: CHEST - 2 VIEW COMPARISON:  January 21, 2011 FINDINGS: Cardiomediastinal silhouette unchanged in size and contour. No evidence of central vascular congestion. No interlobular septal thickening. No pneumothorax or pleural effusion. No confluent airspace disease. Degenerative changes of the spine. IMPRESSION: Negative for acute cardiopulmonary disease Electronically Signed   By: Corrie Mckusick D.O.   On: 06/12/2019 08:08    Procedures Procedures (including critical care time)  CRITICAL CARE Performed by: Virgel Manifold Total critical care time: 35 minutes Critical care time was exclusive of separately billable procedures and treating other patients. Critical care was necessary to treat or prevent imminent or life-threatening deterioration. Critical care was time spent personally by me on the following activities: development of treatment plan with patient and/or surrogate as well as nursing, discussions with consultants, evaluation of patient's response to treatment, examination of patient, obtaining  history from patient or surrogate, ordering and performing treatments and interventions, ordering and review of laboratory studies, ordering and review of radiographic studies, pulse oximetry and re-evaluation of patient's condition.   Medications Ordered in ED Medications  sodium chloride flush (NS) 0.9 % injection 3 mL (has no administration in time range)  aspirin chewable tablet 324 mg (has no administration in time range)  nitroGLYCERIN (NITROGLYN) 2 % ointment 1 inch (has no administration in time range)     Initial Impression / Assessment and Plan / ED Course  I have reviewed the triage vital signs and the nursing notes.  Pertinent labs & imaging results that were available during my care of the patient were reviewed by me and considered in my medical decision making (see chart for details).    48yF with intermittent CP. I am concerned for unstable angina. Some typical features. Risk factors. Abnormal EKG. Currently asymptomatic but had some chest discomfort since arriving to the ED. ASA/heparin. Cardiology consultation.   Final Clinical Impressions(s) / ED Diagnoses   Final diagnoses:  Unstable angina [facility])    ED Discharge Orders    None       Virgel Manifold, MD 06/15/19 1935

## 2019-06-12 NOTE — H&P (Signed)
Cardiology Admission History and Physical:   Patient ID: Taylor Black MRN: [id number]; DOB: 1971-04-25   Admission date: 06/12/2019  Primary Care Provider: [name], [name], MD Primary Cardiologist: New/Chaela Branscum Primary Electrophysiologist:  None   Chief Complaint:  Chest pain  Patient Profile:   Taylor Black is a 48 y.o. female with unRx HTN seen in ER for chest pain   History of Present Illness:   [name] 48 y.o. recently moved from [place] to be near daughter and grand children She last saw a primary care doctor 2 years ago Appears to have unRx HTN. She is obese. 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 She is otherwise sedentary. SSCP starting Thursday Not always exertional central and radiates to left arm. Not pleuritic or positional Nothing improves it. In ER had abnormal ECG with lateral T wave changes and initial HS-Troponin positive 65-> 79  Currently pain free Smoker lipids unknown and denies DM.    Heart Pathway Score:  HEAR Score: 5  Past Medical History:  Diagnosis Date  . Anxiety   . Chicken pox   . Depression   . GERD (gastroesophageal reflux disease)     Past Surgical History:  Procedure Laterality Date  . bowel obstruction  1989  . UMBILICAL HERNIA REPAIR  2017     Medications Prior to Admission: Prior to Admission medications   Medication Sig Start Date End Date Taking? Authorizing Provider  Multiple Vitamin (MULTIVITAMIN WITH MINERALS) TABS tablet Take 1 tablet by mouth daily.   Yes [provider]  omeprazole (PRILOSEC) 40 MG capsule Take 1 capsule (40 mg total) by mouth daily. 06/05/17  Yes [name], [name], MD  albuterol [facility] HFA) 108 [phone] Base) MCG/ACT inhaler Inhale 1-2 puffs into the lungs every 6 (six) hours as needed for wheezing or shortness of breath. Patient not taking: Reported on 06/12/2019 01/21/19   Augusto Gamble B, NP  benzonatate (TESSALON) 100 MG capsule Take 1 capsule (100 mg total) by mouth every  8 (eight) hours. Patient not taking: Reported on 06/12/2019 01/21/19   Augusto Gamble B, NP  erythromycin ophthalmic ointment Place a 1/2 inch ribbon of ointment into the lower eyelid. FOUR times per day for 5-7 days Patient not taking: Reported on 06/12/2019 04/19/18   Shary Decamp, PA-C     Allergies:    Allergies  Allergen Reactions  . Morphine And Related     Social History:   Social History   Socioeconomic History  . Marital status: Legally Separated    Spouse name: Not on file  . Number of children: Not on file  . Years of education: Not on file  . Highest education level: Not on file  Occupational History  . Not on file  Social Needs  . Financial resource strain: Not on file  . Food insecurity    Worry: Not on file    Inability: Not on file  . Transportation needs    Medical: Not on file    Non-medical: Not on file  Tobacco Use  . Smoking status: Current Every Day Smoker  . Smokeless tobacco: Never Used  Substance and Sexual Activity  . Alcohol use: Yes  . Drug use: Yes    Types: Marijuana  . Sexual activity: Not Currently  Lifestyle  . Physical activity    Days per week: Not on file    Minutes per session: Not on file  . Stress: Not on file  Relationships  . Social connections  Talks on phone: Not on file    Gets together: Not on file    Attends religious service: Not on file    Active member of club or organization: Not on file    Attends meetings of clubs or organizations: Not on file    Relationship status: Not on file  . Intimate partner violence    Fear of current or ex partner: Not on file    Emotionally abused: Not on file    Physically abused: Not on file    Forced sexual activity: Not on file  Other Topics Concern  . Not on file  Social History Narrative  . Not on file    Family History:    The patient's family history includes Alcohol abuse in her father; Cancer in her mother; Depression in her paternal grandmother; Diabetes in her father;  Heart disease in her father; Hypertension in her father; Kidney disease in her father and paternal grandmother; Stroke in her father.    ROS:  Please see the history of present illness.  All other ROS reviewed and negative.     Physical Exam/Data:   Vitals:   06/12/19 0845 06/12/19 0858 06/12/19 0919 06/12/19 0945  BP: (!) 162/94  (!) 152/96 (!) 151/88  Pulse: 73  73 73  Resp: 18  (!) 28 20  Temp:      TempSrc:      SpO2:   98% 98%  Weight:  90.7 kg    Height:  5' (1.524 m)     No intake or output data in the 24 hours ending 06/12/19 1038 Last 3 Weights 06/12/2019 10/05/2017 06/05/2017  Weight (lbs) 200 lb 195 lb 11.2 oz 193 lb  Weight (kg) 90.719 kg 88.769 kg 87.544 kg     Body mass index is 39.06 kg/m.  General:  Obese black female  HEENT: normal Lymph: no adenopathy Neck: no JVD Endocrine:  No thryomegaly Vascular: No carotid bruits; FA pulses 2+ bilaterally without bruits  Cardiac:  normal S1, S2; RRR; no murmur   Lungs:  clear to auscultation bilaterally, no wheezing, rhonchi or rales  Abd: soft, nontender, no hepatomegaly  Ext: no edema Musculoskeletal:  No deformities, BUE and BLE strength normal and equal Skin: warm and dry  Neuro:  CNs 2-12 intact, no focal abnormalities noted Psych:  Normal affect    EKG:   SR voltage LVH inferior and lateral T wave inversions   Relevant CV Studies: None   Laboratory Data:  High Sensitivity Troponin:   Recent Labs  Lab 06/12/19 0720 06/12/19 0922  TROPONINIHS 65* 79*      Cardiac EnzymesNo results for input(s): TROPONINI in the last 168 hours. No results for input(s): TROPIPOC in the last 168 hours.  Chemistry Recent Labs  Lab 06/12/19 0720  NA 140  K 4.2  CL 106  CO2 23  GLUCOSE 122*  BUN 14  CREATININE 0.92  CALCIUM 8.7*  GFRNONAA >60  GFRAA >60  ANIONGAP 11    No results for input(s): PROT, ALBUMIN, AST, ALT, ALKPHOS, BILITOT in the last 168 hours. Hematology Recent Labs  Lab 06/12/19 0720  WBC 8.1   RBC 4.84  HGB 11.0*  HCT 37.0  MCV 76.4*  MCH 22.7*  MCHC 29.7*  RDW 18.5*  PLT 346   BNPNo results for input(s): BNP, PROBNP in the last 168 hours.  DDimer No results for input(s): DDIMER in the last 168 hours.   Radiology/Studies:  Dg Chest 2 View  Result Date:  06/12/2019 CLINICAL DATA:  48 year old female with a history of left-sided chest pain EXAM: CHEST - 2 VIEW COMPARISON:  January 21, 2011 FINDINGS: Cardiomediastinal silhouette unchanged in size and contour. No evidence of central vascular congestion. No interlobular septal thickening. No pneumothorax or pleural effusion. No confluent airspace disease. Degenerative changes of the spine. IMPRESSION: Negative for acute cardiopulmonary disease Electronically Signed   By: Corrie Mckusick D.O.   On: 06/12/2019 08:08    Assessment and Plan:   1. Chest Pain:  Recurrent with positive troponin and abnormal ECG History is atypical in that pain is not exertional Not an ideal candidate for non invasive testing due to relative tachycardia and obesity. Discussed options with patient and daughter Burtis Junes diagnostic heart cath from right radial approach in am Risks including stroke, contrast reaction, MI, bleeding and need for emergency surgery discussed Willing to proceed Was started on heparin by ER Add ASA and lopressor 25 bid Check lipids and A1c in am Orders written put on cath board 2. HTN;  See above add cozaar 25 mg daily in addition to lopressor   Severity of Illness: The appropriate patient status for this patient is INPATIENT. Inpatient status is judged to be reasonable and necessary in order to provide the required intensity of service to ensure the patient's safety. The patient's presenting symptoms, physical exam findings, and initial radiographic and laboratory data in the context of their chronic comorbidities is felt to place them at high risk for further clinical deterioration. Furthermore, it is not anticipated that the patient will be  medically stable for discharge from the hospital within 2 midnights of admission. The following factors support the patient status of inpatient.   " The patient's presenting symptoms include chest pain . " The worrisome physical exam findings include Abnormal ECG and elevated troponin . " The initial radiographic and laboratory data are worrisome because of see above . " The chronic co-morbidities include Smoking HTN .   * I certify that at the point of admission it is my clinical judgment that the patient will require inpatient hospital care spanning beyond 2 midnights from the point of admission due to high intensity of service, high risk for further deterioration and high frequency of surveillance required.*    For questions or updates, please contact [place] Please consult [email or web address] for contact info under        Signed, Jenkins Rouge, MD  06/12/2019 10:38 AM

## 2019-06-12 NOTE — Progress Notes (Signed)
ANTICOAGULATION CONSULT NOTE - Initial Consult  Pharmacy Consult for Heparin Indication: chest pain/ACS  Allergies  Allergen Reactions  . Morphine And Related     Patient Measurements: Height: 5' (152.4 cm) Weight: 204 lb 2.3 oz (92.6 kg) IBW/kg (Calculated) : 45.5 Heparin Dosing Weight: 67 kg  Vital Signs: Temp: 98.1 F (36.7 C) (08/09 1508) Temp Source: Oral (08/09 1508) BP: 157/99 (08/09 1508) Pulse Rate: 66 (08/09 1508)  Labs: Recent Labs    06/12/19 0720 06/12/19 0922 06/12/19 1535  HGB 11.0*  --   --   HCT 37.0  --   --   PLT 346  --   --   HEPARINUNFRC  --   --  0.28*  CREATININE 0.92  --   --   TROPONINIHS 65* 79*  --     Estimated Creatinine Clearance: 75.9 mL/min (by C-G formula based on SCr of 0.92 mg/dL).   Medical History: Past Medical History:  Diagnosis Date  . Anxiety   . Chicken pox   . Depression   . GERD (gastroesophageal reflux disease)     Medications:  Medications Prior to Admission  Medication Sig Dispense Refill Last Dose  . Multiple Vitamin (MULTIVITAMIN WITH MINERALS) TABS tablet Take 1 tablet by mouth daily.   06/11/2019 at Unknown time  . omeprazole (PRILOSEC) 40 MG capsule Take 1 capsule (40 mg total) by mouth daily. 30 capsule 3 06/11/2019 at Unknown time  . albuterol (PROAIR HFA) 108 (90 Base) MCG/ACT inhaler Inhale 1-2 puffs into the lungs every 6 (six) hours as needed for wheezing or shortness of breath. (Patient not taking: Reported on 06/12/2019) 1 Inhaler 0 Not Taking at Unknown time  . benzonatate (TESSALON) 100 MG capsule Take 1 capsule (100 mg total) by mouth every 8 (eight) hours. (Patient not taking: Reported on 06/12/2019) 21 capsule 0 Not Taking at Unknown time  . erythromycin ophthalmic ointment Place a 1/2 inch ribbon of ointment into the lower eyelid. FOUR times per day for 5-7 days (Patient not taking: Reported on 06/12/2019) 3.5 g 0 Not Taking at Unknown time    Assessment: 48 yo F presenting with intermittent chest  pain/unstable angina. No anticoaguation PTA. Pharmacy has been consulted to dose heparin.   HL 0.28 (subtherapuetic) after receiving a Heparin 4000 unit bolus x1, then a Heparin gtt of 800 unit/hr. Hgb 11.0, CBC stable. No signs of bleeding.   Goal of Therapy:  Heparin level 0.3-0.7 units/ml Monitor platelets by anticoagulation protocol: Yes   Plan:  Increase heparin gtt to 900 units/hr Check anti-Xa level in 6 hours and daily while on heparin Continue to monitor H&H and platelets  Acey Lav, PharmD  PGY1 [place] Resident [phone] 06/12/2019  4:14 PM  Please check [email or web address] for unit-specific pharmacy phone numbers.

## 2019-06-13 ENCOUNTER — Encounter (HOSPITAL_COMMUNITY)
Admission: EM | Disposition: A | Discharge status: Home / Self Care | Payer: Self-pay | Source: Home / Self Care | Attending: Cardiovascular Disease

## 2019-06-13 ENCOUNTER — Encounter (HOSPITAL_COMMUNITY): Payer: Self-pay | Admitting: Interventional Cardiology

## 2019-06-13 DIAGNOSIS — F17200 Nicotine dependence, unspecified, uncomplicated: Secondary | ICD-10-CM | POA: Diagnosis present

## 2019-06-13 DIAGNOSIS — I519 Heart disease, unspecified: Secondary | ICD-10-CM

## 2019-06-13 DIAGNOSIS — I10 Essential (primary) hypertension: Secondary | ICD-10-CM | POA: Diagnosis present

## 2019-06-13 DIAGNOSIS — I200 Unstable angina: Secondary | ICD-10-CM | POA: Diagnosis present

## 2019-06-13 DIAGNOSIS — I25110 Atherosclerotic heart disease of native coronary artery with unstable angina pectoris: Principal | ICD-10-CM

## 2019-06-13 DIAGNOSIS — Z720 Tobacco use: Secondary | ICD-10-CM

## 2019-06-13 DIAGNOSIS — I2510 Atherosclerotic heart disease of native coronary artery without angina pectoris: Secondary | ICD-10-CM

## 2019-06-13 HISTORY — DX: Heart disease, unspecified: I51.9

## 2019-06-13 HISTORY — DX: Atherosclerotic heart disease of native coronary artery without angina pectoris: I25.10

## 2019-06-13 HISTORY — PX: LEFT HEART CATH AND CORONARY ANGIOGRAPHY: CATH118249

## 2019-06-13 LAB — CBC
HCT: 41 % (ref 36.0–46.0)
Hemoglobin: 12.1 g/dL (ref 12.0–15.0)
MCH: 22.5 pg — ABNORMAL LOW (ref 26.0–34.0)
MCHC: 29.5 g/dL — ABNORMAL LOW (ref 30.0–36.0)
MCV: 76.4 fL — ABNORMAL LOW (ref 80.0–100.0)
Platelets: 351 10*3/uL (ref 150–400)
RBC: 5.37 MIL/uL — ABNORMAL HIGH (ref 3.87–5.11)
RDW: 18.6 % — ABNORMAL HIGH (ref 11.5–15.5)
WBC: 7.3 10*3/uL (ref 4.0–10.5)
nRBC: 0 % (ref 0.0–0.2)

## 2019-06-13 LAB — HEMOGLOBIN A1C
Hgb A1c MFr Bld: 5.9 % — ABNORMAL HIGH (ref 4.8–5.6)
Mean Plasma Glucose: 122.63 mg/dL

## 2019-06-13 LAB — LIPID PANEL
Cholesterol: 194 mg/dL (ref 0–200)
HDL: 66 mg/dL (ref >40)
LDL Cholesterol: 103 mg/dL — ABNORMAL HIGH (ref 0–99)
Total CHOL/HDL Ratio: 2.9 RATIO
Triglycerides: 126 mg/dL (ref <150)
VLDL: 25 mg/dL (ref 0–40)

## 2019-06-13 LAB — BASIC METABOLIC PANEL
Anion gap: 11 (ref 5–15)
BUN: 12 mg/dL (ref 6–20)
CO2: 24 mmol/L (ref 22–32)
Calcium: 9.2 mg/dL (ref 8.9–10.3)
Chloride: 105 mmol/L (ref 98–111)
Creatinine, Ser: 0.88 mg/dL (ref 0.44–1.00)
GFR calc Af Amer: >60 mL/min (ref >60)
GFR calc non Af Amer: >60 mL/min (ref >60)
Glucose, Bld: 107 mg/dL — ABNORMAL HIGH (ref 70–99)
Potassium: 4.3 mmol/L (ref 3.5–5.1)
Sodium: 140 mmol/L (ref 135–145)

## 2019-06-13 LAB — MRSA PCR SCREENING: MRSA by PCR: NEGATIVE

## 2019-06-13 LAB — HIV ANTIBODY (ROUTINE TESTING W REFLEX): HIV Screen 4th Generation wRfx: NONREACTIVE

## 2019-06-13 LAB — HEPARIN LEVEL (UNFRACTIONATED): Heparin Unfractionated: 0.36 IU/mL (ref 0.30–0.70)

## 2019-06-13 SURGERY — LEFT HEART CATH AND CORONARY ANGIOGRAPHY
Anesthesia: LOCAL

## 2019-06-13 MED ORDER — HEPARIN SODIUM (PORCINE) 1000 UNIT/ML IJ SOLN
INTRAMUSCULAR | Status: DC | PRN
  Administered 2019-06-13: 4500 [IU] via INTRAVENOUS

## 2019-06-13 MED ORDER — LABETALOL HCL 5 MG/ML IV SOLN: 10.0000 mg | INTRAVENOUS | Status: DC | PRN

## 2019-06-13 MED ORDER — ONDANSETRON HCL 4 MG/2ML IJ SOLN: 4.0000 mg | Freq: Four times a day (QID) | INTRAMUSCULAR | Status: DC | PRN

## 2019-06-13 MED ORDER — HEPARIN SODIUM (PORCINE) 1000 UNIT/ML IJ SOLN
INTRAMUSCULAR | Status: AC
  Filled 2019-06-13: qty 1

## 2019-06-13 MED ORDER — HEPARIN (PORCINE) IN NACL 1000-0.9 UT/500ML-% IV SOLN
INTRAVENOUS | Status: AC
  Filled 2019-06-13: qty 1000

## 2019-06-13 MED ORDER — ACETAMINOPHEN 325 MG PO TABS: 650.0000 mg | ORAL_TABLET | ORAL | Status: DC | PRN

## 2019-06-13 MED ORDER — ATORVASTATIN CALCIUM 10 MG PO TABS: 20.0000 mg | ORAL_TABLET | Freq: Every day | ORAL | Status: DC

## 2019-06-13 MED ORDER — CARVEDILOL 25 MG PO TABS
25.0000 mg | ORAL_TABLET | Freq: Two times a day (BID) | ORAL | Status: DC
  Administered 2019-06-13: 25 mg via ORAL
  Filled 2019-06-13: qty 1

## 2019-06-13 MED ORDER — ACETAMINOPHEN 325 MG PO TABS
650.0000 mg | ORAL_TABLET | ORAL | Status: DC | PRN
  Administered 2019-06-13: 15:00:00 650 mg via ORAL
  Filled 2019-06-13: qty 2

## 2019-06-13 MED ORDER — HYDRALAZINE HCL 20 MG/ML IJ SOLN: 10.0000 mg | INTRAMUSCULAR | Status: DC | PRN

## 2019-06-13 MED ORDER — SODIUM CHLORIDE 0.9 % IV SOLN: 250.0000 mL | INTRAVENOUS | Status: DC | PRN

## 2019-06-13 MED ORDER — HEPARIN (PORCINE) IN NACL 1000-0.9 UT/500ML-% IV SOLN
INTRAVENOUS | Status: DC | PRN
  Administered 2019-06-13 (×2): 500 mL

## 2019-06-13 MED ORDER — VERAPAMIL HCL 2.5 MG/ML IV SOLN
INTRAVENOUS | Status: DC | PRN
  Administered 2019-06-13: 10 mL via INTRA_ARTERIAL

## 2019-06-13 MED ORDER — SODIUM CHLORIDE 0.9% FLUSH: 3.0000 mL | INTRAVENOUS | Status: DC | PRN

## 2019-06-13 MED ORDER — MIDAZOLAM HCL 2 MG/2ML IJ SOLN
INTRAMUSCULAR | Status: DC | PRN
  Administered 2019-06-13 (×2): 1 mg via INTRAVENOUS

## 2019-06-13 MED ORDER — VERAPAMIL HCL 2.5 MG/ML IV SOLN
INTRAVENOUS | Status: AC
  Filled 2019-06-13: qty 2

## 2019-06-13 MED ORDER — CARVEDILOL 25 MG PO TABS: 25.0000 mg | ORAL_TABLET | Freq: Two times a day (BID) | ORAL | 6 refills | Status: DC

## 2019-06-13 MED ORDER — ASPIRIN 81 MG PO CHEW: 81.0000 mg | CHEWABLE_TABLET | Freq: Every day | ORAL | Status: DC

## 2019-06-13 MED ORDER — LOSARTAN POTASSIUM 50 MG PO TABS: 50.0000 mg | ORAL_TABLET | Freq: Every day | ORAL | 6 refills | Status: DC

## 2019-06-13 MED ORDER — HEPARIN SODIUM (PORCINE) 5000 UNIT/ML IJ SOLN: 5000.0000 [IU] | Freq: Three times a day (TID) | INTRAMUSCULAR | Status: DC

## 2019-06-13 MED ORDER — MIDAZOLAM HCL 2 MG/2ML IJ SOLN
INTRAMUSCULAR | Status: AC
  Filled 2019-06-13: qty 2

## 2019-06-13 MED ORDER — LIDOCAINE HCL (PF) 1 % IJ SOLN
INTRAMUSCULAR | Status: DC | PRN
  Administered 2019-06-13: 1 mL

## 2019-06-13 MED ORDER — IOHEXOL 350 MG/ML SOLN
INTRAVENOUS | Status: DC | PRN
  Administered 2019-06-13: 70 mL via INTRAVENOUS

## 2019-06-13 MED ORDER — SODIUM CHLORIDE 0.9% FLUSH: 3.0000 mL | Freq: Two times a day (BID) | INTRAVENOUS | Status: DC

## 2019-06-13 MED ORDER — LOSARTAN POTASSIUM 50 MG PO TABS
50.0000 mg | ORAL_TABLET | Freq: Every day | ORAL | Status: DC
  Administered 2019-06-13: 08:00:00 50 mg via ORAL
  Filled 2019-06-13: qty 1

## 2019-06-13 MED ORDER — SODIUM CHLORIDE 0.9 % IV SOLN
INTRAVENOUS | Status: AC
  Administered 2019-06-13: 14:00:00 via INTRAVENOUS

## 2019-06-13 MED ORDER — LIDOCAINE HCL (PF) 1 % IJ SOLN
INTRAMUSCULAR | Status: AC
  Filled 2019-06-13: qty 30

## 2019-06-13 MED ORDER — ATORVASTATIN CALCIUM 20 MG PO TABS: 20.0000 mg | ORAL_TABLET | Freq: Every day | ORAL | 6 refills | Status: DC

## 2019-06-13 SURGICAL SUPPLY — 11 items
BAND ZEPHYR COMPRESS 30 LONG (HEMOSTASIS) ×2 IMPLANT
CATH 5FR JL3.5 JR4 ANG PIG MP (CATHETERS) ×2 IMPLANT
GLIDESHEATH SLEND A-KIT 6F 22G (SHEATH) ×2 IMPLANT
GUIDEWIRE INQWIRE 1.5J.035X260 (WIRE) ×1 IMPLANT
INQWIRE 1.5J .035X260CM (WIRE) ×2
KIT HEART LEFT (KITS) ×2 IMPLANT
PACK CARDIAC CATHETERIZATION (CUSTOM PROCEDURE TRAY) ×2 IMPLANT
SHEATH PROBE COVER 6X72 (BAG) ×2 IMPLANT
TRANSDUCER W/STOPCOCK (MISCELLANEOUS) ×2 IMPLANT
TUBING CIL FLEX 10 FLL-RA (TUBING) ×2 IMPLANT
WIRE HI TORQ VERSACORE-J 145CM (WIRE) ×2 IMPLANT

## 2019-06-13 NOTE — Progress Notes (Signed)
ANTICOAGULATION CONSULT NOTE - Follow Up Consult  Pharmacy Consult for heparin Indication: chest pain/ACS  Labs: Recent Labs    06/12/19 0720 06/12/19 0922 06/12/19 1535 06/12/19 2314  HGB 11.0*  --   --   --   HCT 37.0  --   --   --   PLT 346  --   --   --   HEPARINUNFRC  --   --  0.28* 0.28*  CREATININE 0.92  --   --   --   TROPONINIHS 65* 79*  --   --     Assessment: 48yo female remains subtherapeutic on heparin after rate increase; no gtt issues or signs of bleeding per RN.  Goal of Therapy:  Heparin level 0.3-0.7 units/ml   Plan:  Will increase heparin gtt by 1 unit/kg/hr to 1000 units/hr and check level in 6 hours.    Wynona Neat, PharmD, BCPS  06/13/2019,12:02 AM

## 2019-06-13 NOTE — Progress Notes (Signed)
Pt left unit ambulating with son, refused the use of a wheelchair.

## 2019-06-13 NOTE — CV Procedure (Signed)
   Eccentric 30% proximal to mid LAD.  Coronaries otherwise normal.  Circumflex dominant anatomy.  Mild global left ventricular systolic dysfunction with EF 40 to 45%.  LVEDP 11 mmHg.  Right radial access using real-time vascular ultrasound for guidance.

## 2019-06-13 NOTE — Progress Notes (Signed)
Pt left for cath lab , with nurse and transporter.

## 2019-06-13 NOTE — Discharge Instructions (Signed)
Call [facility] at [phone] if any bleeding, swelling or drainage at cath site.  May shower, no tub baths for 48 hours for groin sticks. No lifting over 5 pounds for 3 days.  No Driving for 3 days  Heart Healthy Diet  Stop smoking.

## 2019-06-13 NOTE — Interval H&P Note (Signed)
Cath Lab Visit (complete for each Cath Lab visit)  Clinical Evaluation Leading to the Procedure:   ACS: Yes.    Non-ACS:    Anginal Classification: CCS III  Anti-ischemic medical therapy: Minimal Therapy (1 class of medications)  Non-Invasive Test Results: No non-invasive testing performed  Prior CABG: No previous CABG      History and Physical Interval Note:  06/13/2019 12:30 PM  Taylor Black  has presented today for surgery, with the diagnosis of Chest pain.  The various methods of treatment have been discussed with the patient and family. After consideration of risks, benefits and other options for treatment, the patient has consented to  Procedure(s): LEFT HEART CATH AND CORONARY ANGIOGRAPHY (N/A) as a surgical intervention.  The patient's history has been reviewed, patient examined, no change in status, stable for surgery.  I have reviewed the patient's chart and labs.  Questions were answered to the patient's satisfaction.     Belva Crome III

## 2019-06-13 NOTE — Progress Notes (Signed)
ANTICOAGULATION CONSULT NOTE - [facility] for Heparin Indication: chest pain/ACS  Allergies  Allergen Reactions  . Morphine And Related     Patient Measurements: Height: 5' (152.4 cm) Weight: 203 lb 4.2 oz (92.2 kg) IBW/kg (Calculated) : 45.5 Heparin Dosing Weight: 67 kg  Vital Signs: Temp: 97.8 F (36.6 C) (08/10 0700) Temp Source: Oral (08/10 0700) BP: 158/85 (08/10 0700) Pulse Rate: 56 (08/10 0802)  Labs: Recent Labs    06/12/19 0720 06/12/19 0922 06/12/19 1535 06/12/19 2314 06/13/19 0832  HGB 11.0*  --   --   --  12.1  HCT 37.0  --   --   --  41.0  PLT 346  --   --   --  351  HEPARINUNFRC  --   --  0.28* 0.28* 0.36  CREATININE 0.92  --   --   --   --   TROPONINIHS 65* 79*  --   --   --     Estimated Creatinine Clearance: 75.8 mL/min (by C-G formula based on SCr of 0.92 mg/dL).   Medical History: Past Medical History:  Diagnosis Date  . Anxiety   . Chicken pox   . Depression   . GERD (gastroesophageal reflux disease)     Medications:  Medications Prior to Admission  Medication Sig Dispense Refill Last Dose  . Multiple Vitamin (MULTIVITAMIN WITH MINERALS) TABS tablet Take 1 tablet by mouth daily.   06/11/2019 at Unknown time  . omeprazole (PRILOSEC) 40 MG capsule Take 1 capsule (40 mg total) by mouth daily. 30 capsule 3 06/11/2019 at Unknown time  . albuterol (PROAIR HFA) 108 (90 Base) MCG/ACT inhaler Inhale 1-2 puffs into the lungs every 6 (six) hours as needed for wheezing or shortness of breath. (Patient not taking: Reported on 06/12/2019) 1 Inhaler 0 Not Taking at Unknown time  . benzonatate (TESSALON) 100 MG capsule Take 1 capsule (100 mg total) by mouth every 8 (eight) hours. (Patient not taking: Reported on 06/12/2019) 21 capsule 0 Not Taking at Unknown time  . erythromycin ophthalmic ointment Place a 1/2 inch ribbon of ointment into the lower eyelid. FOUR times per day for 5-7 days (Patient not taking: Reported on 06/12/2019) 3.5 g 0  Not Taking at Unknown time    Assessment: 48 yo F presenting with intermittent chest pain/unstable angina. No anticoaguation PTA. Pharmacy has been consulted to dose heparin.   Heparin level now therapeutic, CBC stable, planning for cardiac cath today.  Goal of Therapy:  Heparin level 0.3-0.7 units/ml Monitor platelets by anticoagulation protocol: Yes   Plan:  Increase heparin gtt to 1000 units/hr Check heparin level and CBC daily   Arrie Senate, PharmD, BCPS Clinical Pharmacist [phone] Please check AMION for all [place] numbers 06/13/2019

## 2019-06-13 NOTE — Discharge Summary (Addendum)
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 Morphine And Related     Diagnostic Studies/Procedures    Cardiac cath 06/13/19  Normal left main.  Eccentric proximal LAD the 30 to 40% range.  Some views appeared to show a lucency in individual frames.  TIMI grade III flow noted.  Normal circumflex, dominant.  Nondominant right coronary  Mild global hypokinesis with EF 40 to 45%.  LVEDP is normal.  RECOMMENDATIONS:   Aggressive risk factor modification: Statin therapy to get LDL less than 70, aspirin therapy, tight blood pressure control 130/80 mmHg or less, and smoking cessation are important.  Global reduction in LV function could be related to hypertension.  BP control with beta-blocker/ARB and possibly diuretic. _____________   History of Present Illness     48 YOF recently moved from [place] to be near her daughter and grandchildren. She has a hx of HTN, tobacco use and obesity.  She presented to ER 06/12/19 with SSCP that started 06/09/19.  Not always exertional, does radiate to Left arm.  Not pleuritic.  Nothing improves it.  EKG with lateral T wave changes and HS-troponin 65 and 79.     COVID neg. Hgb 11, WBC 8.1. plts 346.   BMP was normal.   Pt admitted with plan for cardiac cath the next day.  She was placed on IV heparin.    Hospital Course     Consultants: none   Pt did well overnight but recurrent chest pain once she woke.  On BB and losartan along with ASA.   She underwent cardiac  cath without complications and mild CAD.  Her EF was decreased.  Continue BB and ARB and add statin.  LDL 103  Plan for statin therapy to get LDL < 70, ASA, improved BP control at 130/80 or less and stopping tobacco.   Decrease in LV function could be related to HTN.   BP control with BB/ARB and possible diuretic.  BP was elevated.  She was counseled on smoking cessation.  She has been seen and evaluated by Dr. [name] and found stable for discharge.  Office will call for follow up visit.   _____________  Discharge Vitals Blood pressure 128/83, pulse 64, temperature 98.1 F (36.7 C), temperature source Oral, resp. rate 13, height 5' (1.524 m), weight 92.2 kg, last menstrual period 06/05/2019, SpO2 96 %.  Filed Weights   06/12/19 0858 06/12/19 1200 06/13/19 0353  Weight: 90.7 kg 92.6 kg 92.2 kg    Labs & Radiologic Studies    CBC Recent Labs    06/12/19 0720 06/13/19 0832  WBC 8.1 7.3  HGB 11.0* 12.1  HCT 37.0 41.0  MCV 76.4* 76.4*  PLT 346 160   Basic Metabolic Panel Recent Labs    06/12/19 0720 06/13/19 0832  NA 140 140  K 4.2 4.3  CL 106 105  CO2 23 24  GLUCOSE 122* 107*  BUN 14 12  CREATININE 0.92 0.88  CALCIUM 8.7* 9.2   Liver Function  Tests No results for input(s): AST, ALT, ALKPHOS, BILITOT, PROT, ALBUMIN in the last 72 hours. No results for input(s): LIPASE, AMYLASE in the last 72 hours. Cardiac Enzymes No results for input(s): CKTOTAL, CKMB, CKMBINDEX, TROPONINI in the last 72 hours. BNP Invalid input(s): POCBNP D-Dimer No results for input(s): DDIMER in the last 72 hours. Hemoglobin A1C Recent Labs    06/13/19 0832  HGBA1C 5.9*   Fasting Lipid Panel Recent Labs    06/13/19 0832  CHOL 194  HDL 66  LDLCALC 103*  TRIG 126  CHOLHDL 2.9   Thyroid Function Tests No results for input(s): TSH, T4TOTAL, T3FREE, THYROIDAB in the last 72 hours.  Invalid input(s): FREET3 _____________  Dg Chest 2 View  Result Date: 06/12/2019 CLINICAL DATA:   48 year old female with a history of left-sided chest pain EXAM: CHEST - 2 VIEW COMPARISON:  January 21, 2011 FINDINGS: Cardiomediastinal silhouette unchanged in size and contour. No evidence of central vascular congestion. No interlobular septal thickening. No pneumothorax or pleural effusion. No confluent airspace disease. Degenerative changes of the spine. IMPRESSION: Negative for acute cardiopulmonary disease Electronically Signed   By: Corrie Mckusick D.O.   On: 06/12/2019 08:08   Disposition   Pt is being discharged home today in good condition.  Follow-up Plans & Appointments  Call [facility] at [phone] if any bleeding, swelling or drainage at cath site.  May shower, no tub baths for 48 hours for groin sticks. No lifting over 5 pounds for 3 days.  No Driving for 3 days  Heart Healthy Diet  Stop smoking.    Follow-up Information    [name], MD Follow up.   Specialty: Cardiology Why: the office will call with date and time Contact information: [address] [place] [address] [phone]            Discharge Medications   Allergies as of 06/13/2019      Reactions   Morphine And Related       Medication List    TAKE these medications   acetaminophen 325 MG tablet Commonly known as: TYLENOL Take 2 tablets (650 mg total) by mouth every 4 (four) hours as needed for headache or mild pain.   albuterol 108 (90 Base) MCG/ACT inhaler Commonly known as: ProAir HFA Inhale 1-2 puffs into the lungs every 6 (six) hours as needed for wheezing or shortness of breath.   aspirin 81 MG chewable tablet Chew 1 tablet (81 mg total) by mouth daily. Start taking on: June 14, 2019   atorvastatin 20 MG tablet Commonly known as: LIPITOR Take 1 tablet (20 mg total) by mouth daily at 6 PM.   benzonatate 100 MG capsule Commonly known as: TESSALON Take 1 capsule (100 mg total) by mouth every 8 (eight) hours.   carvedilol 25 MG  tablet Commonly known as: COREG Take 1 tablet (25 mg total) by mouth 2 (two) times daily with a meal.   erythromycin ophthalmic ointment Place a 1/2 inch ribbon of ointment into the lower eyelid. FOUR times per day for 5-7 days   losartan 50 MG tablet Commonly known as: COZAAR Take 1 tablet (50 mg total) by mouth daily. Start taking on: June 14, 2019   multivitamin with minerals Tabs tablet Take 1 tablet by mouth daily.   omeprazole 40 MG capsule Commonly known as: PRILOSEC Take 1 capsule (40 mg total) by mouth daily.        Acute coronary syndrome (MI, NSTEMI,  STEMI, etc) this admission?: No.    Outstanding Labs/Studies   Hepatic and lipid in 6 weeks.   Duration of Discharge Encounter   Greater than 30 minutes including physician time.  Signed, Cecilie Kicks, NP 06/13/2019, 5:11 PM

## 2019-06-13 NOTE — Progress Notes (Signed)
Progress Note  Patient Name: Taylor Black Date of Encounter: 06/13/2019  Primary Cardiologist: New- Dr Johnsie Cancel  Subjective   Notes recurrent chest pain this morning with a pressure sensation. Currently pain free.   Inpatient Medications    Scheduled Meds:  aspirin EC  81 mg Oral Daily   losartan  25 mg Oral Daily   metoprolol tartrate  25 mg Oral BID   pantoprazole  40 mg Oral Daily   sodium chloride flush  3 mL Intravenous Q12H   Continuous Infusions:  sodium chloride     sodium chloride     heparin 1,000 Units/hr (06/13/19 0023)   PRN Meds: sodium chloride, acetaminophen, nitroGLYCERIN, ondansetron (ZOFRAN) IV, sodium chloride flush   Vital Signs    Vitals:   06/12/19 2340 06/13/19 0353 06/13/19 0400 06/13/19 0700  BP: 139/71 (!) 165/98 (!) 165/98 (!) 158/85  Pulse: (!) 58 72 64   Resp: (!) 22 18 15    Temp: 98.1 F (36.7 C) 97.7 F (36.5 C)  97.8 F (36.6 C)  TempSrc: Oral Oral  Oral  SpO2: 97% 100% 100%   Weight:  92.2 kg    Height:        Intake/Output Summary (Last 24 hours) at 06/13/2019 0746 Last data filed at 06/13/2019 0400 Gross per 24 hour  Intake 716.54 ml  Output --  Net 716.54 ml   Last 3 Weights 06/13/2019 06/12/2019 06/12/2019  Weight (lbs) 203 lb 4.2 oz 204 lb 2.3 oz 200 lb  Weight (kg) 92.2 kg 92.6 kg 90.719 kg      Telemetry    NSR - Personally Reviewed  ECG    NSR, LVH. Repolarization abnormality. - Personally Reviewed  Physical Exam   GEN: No acute distress.   Neck: No JVD Cardiac: RRR, no murmurs, rubs, or gallops.  Respiratory: Clear to auscultation bilaterally. GI: Soft, nontender, non-distended  MS: No edema; No deformity. Neuro:  Nonfocal  Psych: Normal affect   Labs    High Sensitivity Troponin:   Recent Labs  Lab 06/12/19 0720 06/12/19 0922  TROPONINIHS 65* 79*      Cardiac EnzymesNo results for input(s): TROPONINI in the last 168 hours. No results for input(s): TROPIPOC in the last 168  hours.   Chemistry Recent Labs  Lab 06/12/19 0720  NA 140  K 4.2  CL 106  CO2 23  GLUCOSE 122*  BUN 14  CREATININE 0.92  CALCIUM 8.7*  GFRNONAA >60  GFRAA >60  ANIONGAP 11     Hematology Recent Labs  Lab 06/12/19 0720  WBC 8.1  RBC 4.84  HGB 11.0*  HCT 37.0  MCV 76.4*  MCH 22.7*  MCHC 29.7*  RDW 18.5*  PLT 346    BNPNo results for input(s): BNP, PROBNP in the last 168 hours.   DDimer No results for input(s): DDIMER in the last 168 hours.   Radiology    Dg Chest 2 View  Result Date: 06/12/2019 CLINICAL DATA:  48 year old female with a history of left-sided chest pain EXAM: CHEST - 2 VIEW COMPARISON:  January 21, 2011 FINDINGS: Cardiomediastinal silhouette unchanged in size and contour. No evidence of central vascular congestion. No interlobular septal thickening. No pneumothorax or pleural effusion. No confluent airspace disease. Degenerative changes of the spine. IMPRESSION: Negative for acute cardiopulmonary disease Electronically Signed   By: Corrie Mckusick D.O.   On: 06/12/2019 08:08    Cardiac Studies   none  Patient Profile     48 y.o. female with  history of uncontrolled and untreated HTN, tobacco abuse is admitted for evaluation of chest pain.  Assessment & Plan    1. Unstable angina. Troponin slightly elevated. Ecg shows LVH with repolarization abnormality. Chest pain recurrent but lasts only seconds. Risk factors of HTN and tobacco abuse. Lipid status unknown. Plan cardiac cath today. Procedure reviewed and questions answered. 2. HTN. Poorly controlled. Will increase losartan. Switch lopressor to Coreg.  3. Tobacco abuse- counseled on smoking cessation 4. Mild anemia.   For questions or updates, please contact [place] Please consult [email or web address] for contact info under        Signed, Letia Guidry [name], MD  06/13/2019, 7:46 AM

## 2019-06-13 NOTE — H&P (View-Only) (Signed)
Progress Note  Patient Name: Taylor Black Date of Encounter: 06/13/2019  Primary Cardiologist: New- Dr Johnsie Cancel  Subjective   Notes recurrent chest pain this morning with a pressure sensation. Currently pain free.   Inpatient Medications    Scheduled Meds:  aspirin EC  81 mg Oral Daily   losartan  25 mg Oral Daily   metoprolol tartrate  25 mg Oral BID   pantoprazole  40 mg Oral Daily   sodium chloride flush  3 mL Intravenous Q12H   Continuous Infusions:  sodium chloride     sodium chloride     heparin 1,000 Units/hr (06/13/19 0023)   PRN Meds: sodium chloride, acetaminophen, nitroGLYCERIN, ondansetron (ZOFRAN) IV, sodium chloride flush   Vital Signs    Vitals:   06/12/19 2340 06/13/19 0353 06/13/19 0400 06/13/19 0700  BP: 139/71 (!) 165/98 (!) 165/98 (!) 158/85  Pulse: (!) 58 72 64   Resp: (!) 22 18 15    Temp: 98.1 F (36.7 C) 97.7 F (36.5 C)  97.8 F (36.6 C)  TempSrc: Oral Oral  Oral  SpO2: 97% 100% 100%   Weight:  92.2 kg    Height:        Intake/Output Summary (Last 24 hours) at 06/13/2019 0746 Last data filed at 06/13/2019 0400 Gross per 24 hour  Intake 716.54 ml  Output --  Net 716.54 ml   Last 3 Weights 06/13/2019 06/12/2019 06/12/2019  Weight (lbs) 203 lb 4.2 oz 204 lb 2.3 oz 200 lb  Weight (kg) 92.2 kg 92.6 kg 90.719 kg      Telemetry    NSR - Personally Reviewed  ECG    NSR, LVH. Repolarization abnormality. - Personally Reviewed  Physical Exam   GEN: No acute distress.   Neck: No JVD Cardiac: RRR, no murmurs, rubs, or gallops.  Respiratory: Clear to auscultation bilaterally. GI: Soft, nontender, non-distended  MS: No edema; No deformity. Neuro:  Nonfocal  Psych: Normal affect   Labs    High Sensitivity Troponin:   Recent Labs  Lab 06/12/19 0720 06/12/19 0922  TROPONINIHS 65* 79*      Cardiac EnzymesNo results for input(s): TROPONINI in the last 168 hours. No results for input(s): TROPIPOC in the last 168  hours.   Chemistry Recent Labs  Lab 06/12/19 0720  NA 140  K 4.2  CL 106  CO2 23  GLUCOSE 122*  BUN 14  CREATININE 0.92  CALCIUM 8.7*  GFRNONAA >60  GFRAA >60  ANIONGAP 11     Hematology Recent Labs  Lab 06/12/19 0720  WBC 8.1  RBC 4.84  HGB 11.0*  HCT 37.0  MCV 76.4*  MCH 22.7*  MCHC 29.7*  RDW 18.5*  PLT 346    BNPNo results for input(s): BNP, PROBNP in the last 168 hours.   DDimer No results for input(s): DDIMER in the last 168 hours.   Radiology    Dg Chest 2 View  Result Date: 06/12/2019 CLINICAL DATA:  48 year old female with a history of left-sided chest pain EXAM: CHEST - 2 VIEW COMPARISON:  January 21, 2011 FINDINGS: Cardiomediastinal silhouette unchanged in size and contour. No evidence of central vascular congestion. No interlobular septal thickening. No pneumothorax or pleural effusion. No confluent airspace disease. Degenerative changes of the spine. IMPRESSION: Negative for acute cardiopulmonary disease Electronically Signed   By: Corrie Mckusick D.O.   On: 06/12/2019 08:08    Cardiac Studies   none  Patient Profile     48 y.o. female with  history of uncontrolled and untreated HTN, tobacco abuse is admitted for evaluation of chest pain.  Assessment & Plan    1. Unstable angina. Troponin slightly elevated. Ecg shows LVH with repolarization abnormality. Chest pain recurrent but lasts only seconds. Risk factors of HTN and tobacco abuse. Lipid status unknown. Plan cardiac cath today. Procedure reviewed and questions answered. 2. HTN. Poorly controlled. Will increase losartan. Switch lopressor to Coreg.  3. Tobacco abuse- counseled on smoking cessation 4. Mild anemia.   For questions or updates, please contact [place] Please consult [email or web address] for contact info under        Signed, Shanisha Lech [name], MD  06/13/2019, 7:46 AM

## 2019-07-13 NOTE — Progress Notes (Deleted)
CARDIOLOGY CONSULT NOTE       Patient ID: Taylor Black MRN: [id number] DOB/AGE: [birth year] 48 y.o.  Primary Physician: [name], [name], MD Primary Cardiologist: [facility] f/u   Active Problems:   * No active hospital problems. *   HPI:  48 y.o. first seen in hospital August 2020.  Moved from [place] to be near daughter and grandchildren. SSCP, R/O lateral T wave changes on ECG. Cath 06/13/19 with non obstructive disease proximal LAD 30-40% EF 40-45% ? DCM from HTN.  D/C on statin, coreg and losartan   ***  ROS All other systems reviewed and negative except as noted above  Past Medical History:  Diagnosis Date  . Anxiety   . 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 Chicken pox   . Depression   . GERD (gastroesophageal reflux disease)   . LV dysfunction  06/13/2019    Family History  Problem Relation Age of Onset  . Cancer Mother   . Heart disease Father   . Stroke Father   . Hypertension Father   . Kidney disease Father   . Diabetes Father   . Alcohol abuse Father   . Depression Paternal Grandmother   . Kidney disease Paternal Grandmother     Social History   Socioeconomic History  . Marital status: Legally Separated    Spouse name: Not on file  . Number of children: Not on file  . Years of education: Not on file  . Highest education level: Not on file  Occupational History  . Not on file  Social Needs  . Financial resource strain: Not on file  . Food insecurity    Worry: Not on file    Inability: Not on file  . Transportation needs    Medical: Not on file    Non-medical: Not on file  Tobacco Use  . Smoking status: Current Every Day Smoker  . Smokeless tobacco: Never Used  Substance and Sexual Activity  . Alcohol use: Yes  . Drug use: Yes    Types: Marijuana  . Sexual activity: Not Currently  Lifestyle  . Physical activity    Days per week: Not on file    Minutes per session: Not on file  .  Stress: Not on file  Relationships  . Social Herbalist on phone: Not on file    Gets together: Not on file    Attends religious service: Not on file    Active member of club or organization: Not on file    Attends meetings of clubs or organizations: Not on file    Relationship status: Not on file  . Intimate partner violence    Fear of current or ex partner: Not on file    Emotionally abused: Not on file    Physically abused: Not on file    Forced sexual activity: Not on file  Other Topics Concern  . Not on file  Social History Narrative  . Not on file    Past Surgical History:  Procedure Laterality Date  . bowel obstruction  1989  . LEFT HEART CATH AND CORONARY ANGIOGRAPHY N/A 06/13/2019   Procedure: LEFT HEART CATH AND CORONARY ANGIOGRAPHY;  Surgeon: Belva Crome, MD;  Location: [facility];  Service: Cardiovascular;  Laterality: N/A;  . UMBILICAL HERNIA REPAIR  2017        Physical Exam: There were no vitals taken for this visit.  Affect appropriate Healthy:  appears stated age 9: normal Neck supple with no adenopathy JVP normal no bruits no thyromegaly Lungs clear with no wheezing and good diaphragmatic motion Heart:  S1/S2 no murmur, no rub, gallop or click PMI normal Abdomen: benighn, BS positve, no tenderness, no AAA no bruit.  No HSM or HJR Distal pulses intact with no bruits No edema Neuro non-focal Skin warm and dry No muscular weakness   Labs:   Lab Results  Component Value Date   WBC 7.3 06/13/2019   HGB 12.1 06/13/2019   HCT 41.0 06/13/2019   MCV 76.4 (L) 06/13/2019   PLT 351 06/13/2019   No results for input(s): NA, K, CL, CO2, BUN, CREATININE, CALCIUM, PROT, BILITOT, ALKPHOS, ALT, AST, GLUCOSE in the last 168 hours.  Invalid input(s): LABALBU No results found for: CKTOTAL, CKMB, CKMBINDEX, TROPONINI  Lab Results  Component Value Date   CHOL 194 06/13/2019   Lab Results  Component Value Date   HDL 66  06/13/2019   Lab Results  Component Value Date   LDLCALC 103 (H) 06/13/2019   Lab Results  Component Value Date   TRIG 126 06/13/2019   Lab Results  Component Value Date   CHOLHDL 2.9 06/13/2019   No results found for: LDLDIRECT    Radiology: No results found.  EKG: 06/13/19 SR LVH with strain    ASSESSMENT AND PLAN:   1. Chest pain: cath 06/13/19 no obstructive disease continue ASA/statin 2. HTN:  Improved with beta blocker ARB *** 3. DCM: non ischemic repeat echo in 6 months on Rx   Signed: Jenkins Rouge 07/13/2019, 2:31 PM

## 2019-07-22 ENCOUNTER — Ambulatory Visit: Payer: 59 | Admitting: Cardiovascular Disease

## 2019-07-25 ENCOUNTER — Ambulatory Visit: Payer: 59 | Admitting: Family Medicine

## 2019-08-01 ENCOUNTER — Encounter: Payer: Self-pay | Admitting: Family Medicine

## 2019-08-01 ENCOUNTER — Ambulatory Visit (INDEPENDENT_AMBULATORY_CARE_PROVIDER_SITE_OTHER): Payer: 59 | Admitting: Family Medicine

## 2019-08-01 ENCOUNTER — Other Ambulatory Visit: Payer: Self-pay

## 2019-08-01 VITALS — BP 118/80 | HR 70 | Temp 97.0°F | Resp 12 | Ht 60.0 in | Wt 206.0 lb

## 2019-08-01 DIAGNOSIS — I2510 Atherosclerotic heart disease of native coronary artery without angina pectoris: Secondary | ICD-10-CM | POA: Diagnosis not present

## 2019-08-01 DIAGNOSIS — I519 Heart disease, unspecified: Secondary | ICD-10-CM

## 2019-08-01 DIAGNOSIS — F17200 Nicotine dependence, unspecified, uncomplicated: Secondary | ICD-10-CM | POA: Diagnosis not present

## 2019-08-01 DIAGNOSIS — I10 Essential (primary) hypertension: Secondary | ICD-10-CM

## 2019-08-01 MED ORDER — LOSARTAN POTASSIUM 100 MG PO TABS: 100.0000 mg | ORAL_TABLET | Freq: Every day | ORAL | 1 refills | Status: DC

## 2019-08-01 MED ORDER — CHANTIX STARTING MONTH PAK 0.5 MG X 11 & 1 MG X 42 PO TABS: ORAL_TABLET | ORAL | 0 refills | Status: AC

## 2019-08-01 NOTE — Assessment & Plan Note (Signed)
Adverse effects of tobacco use as well as benefits of smoking cessation discussed. After discussing a few pharmacologic treatment options as well as side effects, she agrees with trying Chantix for 11 weeks. Instructed to titrate dose as tolerated. We discussed some side effects. Follow-up in 3 months.

## 2019-08-01 NOTE — Progress Notes (Signed)
HPI:   Taylor Black is a 48 y.o. female, who is here today for chronic disease management.   She was last seen on 06/05/2017. Since her last visit she has been diagnosed with no significant CAD and CHF.  On 06/13/2019 she underwent left heart catheterization: Eccentric 30% proximal to mid LAD, coronaries otherwise normal.  Circumflex dominant anatomy.  Mild global LV EF 40 to 45%. She denies orthopnea or PND.  She presented to the ER on 06/12/2019 complaining of chest pain radiated to LUE. She has not had any chest pain since hospital discharge. Currently she is on Aspirin 81 mg daily.  Hx of GERD, she has not noted heartburn.  Hypertension: Currently she is on losartan 50 mg daily and carvedilol 25 mg twice daily. BP home readings: 127/88, 115/91, 159/97, 140/92, and 119/83. She denies unusual headache, visual changes, palpitation, dyspnea, diaphoresis, neurologic focal deficit, or edema.  Lab Results  Component Value Date   CREATININE 0.88 06/13/2019   BUN 12 06/13/2019   NA 140 06/13/2019   K 4.3 06/13/2019   CL 105 06/13/2019   CO2 24 06/13/2019   Hyperlipidemia: Currently she is on atorvastatin 20 mg daily. She is trying to eat healthier. She is tolerating medication well.  Lab Results  Component Value Date   CHOL 194 06/13/2019   HDL 66 06/13/2019   LDLCALC 103 (H) 06/13/2019   TRIG 126 06/13/2019   CHOLHDL 2.9 06/13/2019   She has been under some stress for the past few months. She works from 10 PM to 6 AM and during the day she takes care of her grandchildren, 7 years old and 58 months. She lives with her daughter. She sleeps about 3 to 4 hours daily, she wakes up a few times.  She has not been exercising regularly.  Tobacco use: She started smoking at age 78. Max PPD one half, currently 1 pack of cigarettes lasts 2 to 3 days. In the past she has tried nicotine patches, which helped with her smoking cessation for about 6 months.  She  interested in trying pharmacologic treatment for smoking cessation.   Review of Systems  Constitutional: Positive for fatigue. Negative for activity change, appetite change, fever and unexpected weight change.  HENT: Negative for mouth sores, nosebleeds and trouble swallowing.   Eyes: Negative for redness and visual disturbance.  Respiratory: Negative for cough and wheezing.   Gastrointestinal: Negative for abdominal pain, nausea and vomiting.       Negative for changes in bowel habits.  Endocrine: Negative for cold intolerance and heat intolerance.  Genitourinary: Negative for decreased urine volume and hematuria.  Musculoskeletal: Negative for gait problem and myalgias.  Neurological: Negative for syncope and facial asymmetry.  Psychiatric/Behavioral: Positive for sleep disturbance. Negative for confusion. The patient is nervous/anxious.   Rest see pertinent positives and negatives per HPI.   Current Outpatient Medications on File Prior to Visit  Medication Sig Dispense Refill  . acetaminophen (TYLENOL) 325 MG tablet Take 2 tablets (650 mg total) by mouth every 4 (four) hours as needed for headache or mild pain.    Marland Kitchen aspirin 81 MG chewable tablet Chew 1 tablet (81 mg total) by mouth daily.    Marland Kitchen atorvastatin (LIPITOR) 20 MG tablet Take 1 tablet (20 mg total) by mouth daily at 6 PM. 30 tablet 6  . carvedilol (COREG) 25 MG tablet Take 1 tablet (25 mg total) by mouth 2 (two) times daily with a meal. 60 tablet  6  . Multiple Vitamin (MULTIVITAMIN WITH MINERALS) TABS tablet Take 1 tablet by mouth daily.    Marland Kitchen omeprazole (PRILOSEC) 40 MG capsule Take 1 capsule (40 mg total) by mouth daily. 30 capsule 3   No current facility-administered medications on file prior to visit.      Past Medical History:  Diagnosis Date  . Anxiety   . 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 Chicken pox   . Depression   . GERD (gastroesophageal reflux disease)   . LV dysfunction   06/13/2019   Allergies  Allergen Reactions  . Morphine And Related     Social History   Socioeconomic History  . Marital status: Legally Separated    Spouse name: Not on file  . Number of children: Not on file  . Years of education: Not on file  . Highest education level: Not on file  Occupational History  . Not on file  Social Needs  . Financial resource strain: Not on file  . Food insecurity    Worry: Not on file    Inability: Not on file  . Transportation needs    Medical: Not on file    Non-medical: Not on file  Tobacco Use  . Smoking status: Current Every Day Smoker  . Smokeless tobacco: Never Used  Substance and Sexual Activity  . Alcohol use: Yes  . Drug use: Yes    Types: Marijuana  . Sexual activity: Not Currently  Lifestyle  . Physical activity    Days per week: Not on file    Minutes per session: Not on file  . Stress: Not on file  Relationships  . Social Herbalist on phone: Not on file    Gets together: Not on file    Attends religious service: Not on file    Active member of club or organization: Not on file    Attends meetings of clubs or organizations: Not on file    Relationship status: Not on file  Other Topics Concern  . Not on file  Social History Narrative  . Not on file    Vitals:   08/01/19 0905  BP: 118/80  Pulse: 70  Resp: 12  Temp: (!) 97 F (36.1 C)  SpO2: 98%   Body mass index is 40.23 kg/m.  Physical Exam  Nursing note and vitals reviewed. Constitutional: She is oriented to person, place, and time. She appears well-developed. No distress.  HENT:  Head: Normocephalic and atraumatic.  Mouth/Throat: Oropharynx is clear and moist and mucous membranes are normal.  Eyes: Pupils are equal, round, and reactive to light. Conjunctivae are normal.  Cardiovascular: Normal rate and regular rhythm.  No murmur heard. Pulses:      Dorsalis pedis pulses are 2+ on the right side and 2+ on the left side.  Respiratory:  Effort normal and breath sounds normal. No respiratory distress.  GI: Soft. She exhibits no mass. There is no hepatomegaly. There is no abdominal tenderness.  Musculoskeletal:        General: No edema.  Lymphadenopathy:    She has no cervical adenopathy.  Neurological: She is alert and oriented to person, place, and time. She has normal strength. No cranial nerve deficit. Gait normal.  Skin: Skin is warm. No rash noted. No erythema.  Psychiatric: Her mood appears anxious.  Well groomed, good eye contact.    ASSESSMENT AND PLAN:  Ms. Senie was seen today for follow-up.  Diagnoses  and all orders for this visit:  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 06/13/19  Nonsignificant. Continue Aspirin 81 mg daily and atorvastatin 20 mg daily. Instructed about warning signs.  HTN (hypertension) Home BP elevated. Losartan increased from 50 mg to 100 mg. Continue carvedilol 25 mg twice daily. Continue monitoring BP regularly. She is overdue for eye exam, recommended. Continue low-salt diet. Follow-up in 3 months.  Obesity, morbid, BMI 40.0-49.9 (Angelica) She is very motivated to start working on wt loss. We discussed benefits of wt loss as well as adverse effects of obesity. Consistency with healthy diet and physical activity recommended.   LV dysfunction  Asymptomatic. Continue losartan and carvedilol. Low-salt diet recommended. Continue following with cardiologist.   Tobacco use disorder Adverse effects of tobacco use as well as benefits of smoking cessation discussed. After discussing a few pharmacologic treatment options as well as side effects, she agrees with trying Chantix for 11 weeks. Instructed to titrate dose as tolerated. We discussed some side effects. Follow-up in 3 months.   Return in about 3 months (around 10/31/2019).   -Ms. Fawna June Iovine was advised to return sooner than planned today if new concerns arise.   Maleyah Evans G. [name], MD  [facility]. [place] office.

## 2019-08-01 NOTE — Assessment & Plan Note (Signed)
Home BP elevated. Losartan increased from 50 mg to 100 mg. Continue carvedilol 25 mg twice daily. Continue monitoring BP regularly. She is overdue for eye exam, recommended. Continue low-salt diet. Follow-up in 3 months.

## 2019-08-01 NOTE — Assessment & Plan Note (Signed)
Asymptomatic. Continue losartan and carvedilol. Low-salt diet recommended. Continue following with cardiologist.

## 2019-08-01 NOTE — Patient Instructions (Addendum)
A few things to remember from today's visit:   Hypertension, unspecified type - Plan: losartan (COZAAR) 100 MG tablet  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 06/13/19   Tobacco use disorder - Plan: varenicline (CHANTIX STARTING MONTH PAK) 0.5 MG X 11 & 1 MG X 42 tablet Because home blood pressures are elevated, Cozaar was increased from 50 mg to 100 mg. Continue monitoring blood pressure at home.  Coping with Quitting Smoking  Quitting smoking is a physical and mental challenge. You will face cravings, withdrawal symptoms, and temptation. Before quitting, work with your health care provider to make a plan that can help you cope. Preparation can help you quit and keep you from giving in. How can I cope with cravings? Cravings usually last for 5-10 minutes. If you get through it, the craving will pass. Consider taking the following actions to help you cope with cravings:  Keep your mouth busy: ? Chew sugar-free gum. ? Suck on hard candies or a straw. ? Brush your teeth.  Keep your hands and body busy: ? Immediately change to a different activity when you feel a craving. ? Squeeze or play with a ball. ? Do an activity or a hobby, like making bead jewelry, practicing needlepoint, or working with wood. ? Mix up your normal routine. ? Take a short exercise break. Go for a quick walk or run up and down stairs. ? Spend time in public places where smoking is not allowed.  Focus on doing something kind or helpful for someone else.  Call a friend or family member to talk during a craving.  Join a support group.  Call a quit line, such as 1-800-QUIT-NOW.  Talk with your health care provider about medicines that might help you cope with cravings and make quitting easier for you. How can I deal with withdrawal symptoms? Your body may experience negative effects as it tries to get used to not having nicotine in the system. These effects are called withdrawal symptoms. They may include:   Feeling hungrier than normal.  Trouble concentrating.  Irritability.  Trouble sleeping.  Feeling depressed.  Restlessness and agitation.  Craving a cigarette. To manage withdrawal symptoms:  Avoid places, people, and activities that trigger your cravings.  Remember why you want to quit.  Get plenty of sleep.  Avoid coffee and other caffeinated drinks. These may worsen some of your symptoms. How can I handle social situations? Social situations can be difficult when you are quitting smoking, especially in the first few weeks. To manage this, you can:  Avoid parties, bars, and other social situations where people might be smoking.  Avoid alcohol.  Leave right away if you have the urge to smoke.  Explain to your family and friends that you are quitting smoking. Ask for understanding and support.  Plan activities with friends or family where smoking is not an option. What are some ways I can cope with stress? Wanting to smoke may cause stress, and stress can make you want to smoke. Find ways to manage your stress. Relaxation techniques can help. For example:  Breathe slowly and deeply, in through your nose and out through your mouth.  Listen to soothing, relaxing music.  Talk with a family member or friend about your stress.  Light a candle.  Soak in a bath or take a shower.  Think about a peaceful place. What are some ways I can prevent weight gain? Be aware that many people gain weight after they quit smoking.  However, not everyone does. To keep from gaining weight, have a plan in place before you quit and stick to the plan after you quit. Your plan should include:  Having healthy snacks. When you have a craving, it may help to: ? Eat plain popcorn, crunchy carrots, celery, or other cut vegetables. ? Chew sugar-free gum.  Changing how you eat: ? Eat small portion sizes at meals. ? Eat 4-6 small meals throughout the day instead of 1-2 large meals a day. ? Be  mindful when you eat. Do not watch television or do other things that might distract you as you eat.  Exercising regularly: ? Make time to exercise each day. If you do not have time for a long workout, do short bouts of exercise for 5-10 minutes several times a day. ? Do some form of strengthening exercise, like weight lifting, and some form of aerobic exercise, like running or swimming.  Drinking plenty of water or other low-calorie or no-calorie drinks. Drink 6-8 glasses of water daily, or as much as instructed by your health care provider. Summary  Quitting smoking is a physical and mental challenge. You will face cravings, withdrawal symptoms, and temptation to smoke again. Preparation can help you as you go through these challenges.  You can cope with cravings by keeping your mouth busy (such as by chewing gum), keeping your body and hands busy, and making calls to family, friends, or a helpline for people who want to quit smoking.  You can cope with withdrawal symptoms by avoiding places where people smoke, avoiding drinks with caffeine, and getting plenty of rest.  Ask your health care provider about the different ways to prevent weight gain, avoid stress, and handle social situations. This information is not intended to replace advice given to you by your health care provider. Make sure you discuss any questions you have with your health care provider. Document Released: 10/17/2016 Document Revised: 10/02/2017 Document Reviewed: 10/17/2016 Elsevier Patient Education  [place].  Please be sure medication list is accurate. If a new problem present, please set up appointment sooner than planned today.

## 2019-08-01 NOTE — Assessment & Plan Note (Addendum)
She is very motivated to start working on wt loss. We discussed benefits of wt loss as well as adverse effects of obesity. Consistency with healthy diet and physical activity recommended.

## 2019-08-01 NOTE — Assessment & Plan Note (Signed)
Nonsignificant. Continue Aspirin 81 mg daily and atorvastatin 20 mg daily. Instructed about warning signs.
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
## 2019-08-29 ENCOUNTER — Encounter: Payer: Self-pay | Admitting: Cardiovascular Disease

## 2019-08-29 ENCOUNTER — Other Ambulatory Visit: Payer: Self-pay

## 2019-08-29 ENCOUNTER — Ambulatory Visit (INDEPENDENT_AMBULATORY_CARE_PROVIDER_SITE_OTHER): Payer: 59 | Admitting: Cardiovascular Disease

## 2019-08-29 VITALS — BP 110/72 | HR 61 | Ht 60.0 in | Wt 212.0 lb

## 2019-08-29 DIAGNOSIS — I420 Dilated cardiomyopathy: Secondary | ICD-10-CM

## 2019-08-29 DIAGNOSIS — I10 Essential (primary) hypertension: Secondary | ICD-10-CM

## 2019-08-29 NOTE — Patient Instructions (Addendum)
Medication Instructions:   *If you need a refill on your cardiac medications before your next appointment, please call your pharmacy*  Lab Work:  If you have labs (blood work) drawn today and your tests are completely normal, you will receive your results only by: Marland Kitchen MyChart Message (if you have MyChart) OR . A paper copy in the mail If you have any lab test that is abnormal or we need to change your treatment, we will call you to review the results.  Testing/Procedures: Your physician has requested that you have an echocardiogram in February on same day as office visit. Echocardiography is a painless test that uses sound waves to create images of your heart. It provides your doctor with information about the size and shape of your heart and how well your heart's chambers and valves are working. This procedure takes approximately one hour. There are no restrictions for this procedure.  Follow-Up: At [facility], you and your health needs are our priority.  As part of our continuing mission to provide you with exceptional heart care, we have created designated Provider Care Teams.  These Care Teams include your primary Cardiologist (physician) and Advanced Practice Providers (APPs -  Physician Assistants and Nurse Practitioners) who all work together to provide you with the care you need, when you need it.  Your next appointment:   February  The format for your next appointment:   In Person  Provider:   You may see Jenkins Rouge, MD or one of the following Advanced Practice Providers on your designated Care Team:    Truitt Merle, NP  Cecilie Kicks, NP  Kathyrn Drown, NP

## 2019-09-06 ENCOUNTER — Encounter: Payer: Self-pay | Admitting: Family Medicine

## 2019-09-06 DIAGNOSIS — I10 Essential (primary) hypertension: Secondary | ICD-10-CM

## 2019-09-06 NOTE — Telephone Encounter (Signed)
Ok to send 90 day supply. 

## 2019-09-09 MED ORDER — ATORVASTATIN CALCIUM 20 MG PO TABS: 20.0000 mg | ORAL_TABLET | Freq: Every day | ORAL | 3 refills | Status: DC

## 2019-09-09 MED ORDER — LOSARTAN POTASSIUM 100 MG PO TABS: 100.0000 mg | ORAL_TABLET | Freq: Every day | ORAL | 1 refills | Status: DC

## 2019-09-09 MED ORDER — CARVEDILOL 25 MG PO TABS: 25.0000 mg | ORAL_TABLET | Freq: Two times a day (BID) | ORAL | 3 refills | Status: DC

## 2019-09-13 ENCOUNTER — Telehealth: Payer: Self-pay | Admitting: Family Medicine

## 2019-09-13 NOTE — Telephone Encounter (Signed)
Patient requesting call back from CMA to discuss switching all of her medications to [facility]. They will need to be resent, as she will not pick up RX from [facility].

## 2019-09-22 ENCOUNTER — Other Ambulatory Visit: Payer: Self-pay | Admitting: Family Medicine

## 2019-09-22 DIAGNOSIS — I10 Essential (primary) hypertension: Secondary | ICD-10-CM

## 2019-09-22 MED ORDER — CARVEDILOL 25 MG PO TABS: 25.0000 mg | ORAL_TABLET | Freq: Two times a day (BID) | ORAL | 3 refills | Status: DC

## 2019-09-22 MED ORDER — LOSARTAN POTASSIUM 100 MG PO TABS: 100.0000 mg | ORAL_TABLET | Freq: Every day | ORAL | 1 refills | Status: DC

## 2019-09-22 MED ORDER — ATORVASTATIN CALCIUM 20 MG PO TABS: 20.0000 mg | ORAL_TABLET | Freq: Every day | ORAL | 3 refills | Status: DC

## 2019-09-22 NOTE — Telephone Encounter (Signed)
Medication Refill - Medication: atorvastatin, carvedilol, losartan  Has the patient contacted their pharmacy? Yes.   PT states she is completely out of these medications. Please send to new pharmacy. Please advise.  (Agent: If no, request that the patient contact the pharmacy for the refill.) (Agent: If yes, when and what did the pharmacy advise?)  Preferred Pharmacy (with phone number or street name):  [facility] [place] [facility] [address] [place] [address]  Phone: [phone] Fax: [phone]  Not a 24 hour pharmacy; exact hours not known.     Agent: Please be advised that RX refills may take up to 3 business days. We ask that you follow-up with your pharmacy.

## 2019-11-22 ENCOUNTER — Encounter: Payer: Self-pay | Admitting: Family Medicine

## 2019-11-28 ENCOUNTER — Ambulatory Visit: Payer: 59 | Admitting: Family Medicine
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
## 2019-12-14 ENCOUNTER — Encounter: Payer: Self-pay | Admitting: Cardiovascular Disease

## 2019-12-14 ENCOUNTER — Ambulatory Visit (HOSPITAL_COMMUNITY): Payer: 59 | Attending: Cardiovascular Disease

## 2019-12-14 ENCOUNTER — Ambulatory Visit (INDEPENDENT_AMBULATORY_CARE_PROVIDER_SITE_OTHER): Payer: 59 | Admitting: Cardiovascular Disease

## 2019-12-14 ENCOUNTER — Other Ambulatory Visit: Payer: Self-pay

## 2019-12-14 VITALS — BP 138/76 | HR 98 | Ht 60.0 in | Wt 200.0 lb

## 2019-12-14 DIAGNOSIS — I420 Dilated cardiomyopathy: Secondary | ICD-10-CM | POA: Diagnosis not present

## 2019-12-14 NOTE — Patient Instructions (Signed)
Medication Instructions:   *If you need a refill on your cardiac medications before your next appointment, please call your pharmacy*  Lab Work:  If you have labs (blood work) drawn today and your tests are completely normal, you will receive your results only by: Marland Kitchen MyChart Message (if you have MyChart) OR . A paper copy in the mail If you have any lab test that is abnormal or we need to change your treatment, we will call you to review the results.  Follow-Up: At [facility], you and your health needs are our priority.  As part of our continuing mission to provide you with exceptional heart care, we have created designated Provider Care Teams.  These Care Teams include your primary Cardiologist (physician) and Advanced Practice Providers (APPs -  Physician Assistants and Nurse Practitioners) who all work together to provide you with the care you need, when you need it.  Your next appointment:   1 year(s)  The format for your next appointment:   In Person  Provider:   You may see Jenkins Rouge, MD or one of the following Advanced Practice Providers on your designated Care Team:    Truitt Merle, NP  Cecilie Kicks, NP  Kathyrn Drown, NP

## 2020-01-11 ENCOUNTER — Ambulatory Visit: Payer: 59 | Attending: Internal Medicine

## 2020-01-11 DIAGNOSIS — Z20822 Contact with and (suspected) exposure to covid-19: Secondary | ICD-10-CM

## 2020-01-12 LAB — NOVEL CORONAVIRUS, NAA: SARS-CoV-2, NAA: NOT DETECTED

## 2020-03-07 ENCOUNTER — Other Ambulatory Visit: Payer: Self-pay | Admitting: Family Medicine

## 2020-03-07 DIAGNOSIS — I10 Essential (primary) hypertension: Secondary | ICD-10-CM

## 2020-05-24 ENCOUNTER — Encounter: Payer: Self-pay | Admitting: Family Medicine

## 2020-05-26 ENCOUNTER — Encounter (HOSPITAL_COMMUNITY): Payer: Self-pay

## 2020-05-26 ENCOUNTER — Inpatient Hospital Stay (HOSPITAL_COMMUNITY)
Admission: EM | Admit: 2020-05-26 | Discharge: 2020-05-29 | DRG: 393 | Disposition: A | Discharge status: Home / Self Care | Payer: 59 | Attending: Internal Medicine | Admitting: Internal Medicine

## 2020-05-26 ENCOUNTER — Emergency Department (HOSPITAL_COMMUNITY): Payer: 59

## 2020-05-26 DIAGNOSIS — Z6839 Body mass index (BMI) 39.0-39.9, adult: Secondary | ICD-10-CM

## 2020-05-26 DIAGNOSIS — K641 Second degree hemorrhoids: Secondary | ICD-10-CM | POA: Diagnosis present

## 2020-05-26 DIAGNOSIS — K621 Rectal polyp: Secondary | ICD-10-CM

## 2020-05-26 DIAGNOSIS — D62 Acute posthemorrhagic anemia: Secondary | ICD-10-CM | POA: Diagnosis present

## 2020-05-26 DIAGNOSIS — K2970 Gastritis, unspecified, without bleeding: Secondary | ICD-10-CM | POA: Diagnosis not present

## 2020-05-26 DIAGNOSIS — K219 Gastro-esophageal reflux disease without esophagitis: Secondary | ICD-10-CM | POA: Diagnosis present

## 2020-05-26 DIAGNOSIS — K635 Polyp of colon: Secondary | ICD-10-CM

## 2020-05-26 DIAGNOSIS — I10 Essential (primary) hypertension: Secondary | ICD-10-CM | POA: Diagnosis present

## 2020-05-26 DIAGNOSIS — I2510 Atherosclerotic heart disease of native coronary artery without angina pectoris: Secondary | ICD-10-CM | POA: Diagnosis present

## 2020-05-26 DIAGNOSIS — Z20822 Contact with and (suspected) exposure to covid-19: Secondary | ICD-10-CM | POA: Diagnosis present

## 2020-05-26 DIAGNOSIS — D649 Anemia, unspecified: Secondary | ICD-10-CM

## 2020-05-26 DIAGNOSIS — Z7982 Long term (current) use of aspirin: Secondary | ICD-10-CM

## 2020-05-26 DIAGNOSIS — Z79899 Other long term (current) drug therapy: Secondary | ICD-10-CM

## 2020-05-26 DIAGNOSIS — K5731 Diverticulosis of large intestine without perforation or abscess with bleeding: Secondary | ICD-10-CM | POA: Diagnosis present

## 2020-05-26 DIAGNOSIS — K922 Gastrointestinal hemorrhage, unspecified: Secondary | ICD-10-CM | POA: Diagnosis not present

## 2020-05-26 DIAGNOSIS — K5730 Diverticulosis of large intestine without perforation or abscess without bleeding: Secondary | ICD-10-CM | POA: Diagnosis not present

## 2020-05-26 DIAGNOSIS — R935 Abnormal findings on diagnostic imaging of other abdominal regions, including retroperitoneum: Secondary | ICD-10-CM | POA: Diagnosis not present

## 2020-05-26 DIAGNOSIS — D509 Iron deficiency anemia, unspecified: Secondary | ICD-10-CM

## 2020-05-26 DIAGNOSIS — E785 Hyperlipidemia, unspecified: Secondary | ICD-10-CM

## 2020-05-26 DIAGNOSIS — F17200 Nicotine dependence, unspecified, uncomplicated: Secondary | ICD-10-CM | POA: Diagnosis present

## 2020-05-26 DIAGNOSIS — D259 Leiomyoma of uterus, unspecified: Secondary | ICD-10-CM | POA: Diagnosis present

## 2020-05-26 DIAGNOSIS — K2100 Gastro-esophageal reflux disease with esophagitis, without bleeding: Secondary | ICD-10-CM | POA: Diagnosis present

## 2020-05-26 DIAGNOSIS — K769 Liver disease, unspecified: Secondary | ICD-10-CM | POA: Diagnosis present

## 2020-05-26 DIAGNOSIS — F419 Anxiety disorder, unspecified: Secondary | ICD-10-CM | POA: Diagnosis present

## 2020-05-26 DIAGNOSIS — K2990 Gastroduodenitis, unspecified, without bleeding: Secondary | ICD-10-CM | POA: Diagnosis not present

## 2020-05-26 DIAGNOSIS — K449 Diaphragmatic hernia without obstruction or gangrene: Secondary | ICD-10-CM

## 2020-05-26 DIAGNOSIS — K921 Melena: Secondary | ICD-10-CM | POA: Diagnosis not present

## 2020-05-26 LAB — SARS CORONAVIRUS 2 BY RT PCR (HOSPITAL ORDER, PERFORMED IN ~~LOC~~ HOSPITAL LAB): SARS Coronavirus 2: NEGATIVE

## 2020-05-26 LAB — TROPONIN I (HIGH SENSITIVITY)
Troponin I (High Sensitivity): 3 ng/L (ref <18)
Troponin I (High Sensitivity): 4 ng/L (ref <18)

## 2020-05-26 LAB — BASIC METABOLIC PANEL
Anion gap: 7 (ref 5–15)
BUN: 17 mg/dL (ref 6–20)
CO2: 21 mmol/L — ABNORMAL LOW (ref 22–32)
Calcium: 8.5 mg/dL — ABNORMAL LOW (ref 8.9–10.3)
Chloride: 112 mmol/L — ABNORMAL HIGH (ref 98–111)
Creatinine, Ser: 0.86 mg/dL (ref 0.44–1.00)
GFR calc Af Amer: >60 mL/min (ref >60)
GFR calc non Af Amer: >60 mL/min (ref >60)
Glucose, Bld: 117 mg/dL — ABNORMAL HIGH (ref 70–99)
Potassium: 4 mmol/L (ref 3.5–5.1)
Sodium: 140 mmol/L (ref 135–145)

## 2020-05-26 LAB — CBC
HCT: 18.6 % — ABNORMAL LOW (ref 36.0–46.0)
Hemoglobin: 5.3 g/dL — CL (ref 12.0–15.0)
MCH: 22.5 pg — ABNORMAL LOW (ref 26.0–34.0)
MCHC: 28.5 g/dL — ABNORMAL LOW (ref 30.0–36.0)
MCV: 78.8 fL — ABNORMAL LOW (ref 80.0–100.0)
Platelets: 352 10*3/uL (ref 150–400)
RBC: 2.36 MIL/uL — ABNORMAL LOW (ref 3.87–5.11)
RDW: 18.1 % — ABNORMAL HIGH (ref 11.5–15.5)
WBC: 8.4 10*3/uL (ref 4.0–10.5)
nRBC: 0 % (ref 0.0–0.2)

## 2020-05-26 LAB — HEMOGLOBIN AND HEMATOCRIT, BLOOD
HCT: 18.7 % — ABNORMAL LOW (ref 36.0–46.0)
Hemoglobin: 5.4 g/dL — CL (ref 12.0–15.0)

## 2020-05-26 LAB — I-STAT BETA HCG BLOOD, ED (MC, WL, AP ONLY): I-stat hCG, quantitative: <5 m[IU]/mL (ref <5)

## 2020-05-26 LAB — ABO/RH: ABO/RH(D): O POS

## 2020-05-26 LAB — PREPARE RBC (CROSSMATCH)

## 2020-05-26 LAB — POC OCCULT BLOOD, ED: Fecal Occult Bld: POSITIVE — AB

## 2020-05-26 MED ORDER — ACETAMINOPHEN 325 MG PO TABS: 650.0000 mg | ORAL_TABLET | Freq: Four times a day (QID) | ORAL | Status: DC | PRN

## 2020-05-26 MED ORDER — SODIUM CHLORIDE 0.9 % IV SOLN
8.0000 mg/h | INTRAVENOUS | Status: DC
  Administered 2020-05-26 – 2020-05-28 (×3): 8 mg/h via INTRAVENOUS
  Filled 2020-05-26 (×4): qty 80

## 2020-05-26 MED ORDER — SODIUM CHLORIDE 0.9 % IV SOLN: INTRAVENOUS | Status: DC

## 2020-05-26 MED ORDER — ATORVASTATIN CALCIUM 10 MG PO TABS
20.0000 mg | ORAL_TABLET | Freq: Every day | ORAL | Status: DC
  Administered 2020-05-27 – 2020-05-28 (×2): 20 mg via ORAL
  Filled 2020-05-26 (×2): qty 2

## 2020-05-26 MED ORDER — SODIUM CHLORIDE 0.9 % IV SOLN
80.0000 mg | Freq: Once | INTRAVENOUS | Status: AC
  Administered 2020-05-26: 80 mg via INTRAVENOUS
  Filled 2020-05-26: qty 80

## 2020-05-26 MED ORDER — ACETAMINOPHEN 650 MG RE SUPP: 650.0000 mg | Freq: Four times a day (QID) | RECTAL | Status: DC | PRN

## 2020-05-26 MED ORDER — TRAZODONE HCL 50 MG PO TABS
25.0000 mg | ORAL_TABLET | Freq: Every evening | ORAL | Status: DC | PRN
  Administered 2020-05-27 – 2020-05-29 (×3): 25 mg via ORAL
  Filled 2020-05-26 (×3): qty 1

## 2020-05-26 MED ORDER — ONDANSETRON HCL 4 MG PO TABS: 4.0000 mg | ORAL_TABLET | Freq: Four times a day (QID) | ORAL | Status: DC | PRN

## 2020-05-26 MED ORDER — SODIUM CHLORIDE 0.9% FLUSH
3.0000 mL | Freq: Once | INTRAVENOUS | Status: AC
  Administered 2020-05-26: 3 mL via INTRAVENOUS

## 2020-05-26 MED ORDER — PANTOPRAZOLE SODIUM 40 MG IV SOLR: 40.0000 mg | Freq: Two times a day (BID) | INTRAVENOUS | Status: DC

## 2020-05-26 MED ORDER — SODIUM CHLORIDE 0.9 % IV SOLN
10.0000 mL/h | Freq: Once | INTRAVENOUS | Status: AC
  Administered 2020-05-27: 10 mL/h via INTRAVENOUS

## 2020-05-26 MED ORDER — CARVEDILOL 25 MG PO TABS
25.0000 mg | ORAL_TABLET | Freq: Two times a day (BID) | ORAL | Status: DC
  Administered 2020-05-27: 25 mg via ORAL
  Filled 2020-05-26: qty 1

## 2020-05-26 MED ORDER — ONDANSETRON HCL 4 MG/2ML IJ SOLN: 4.0000 mg | Freq: Four times a day (QID) | INTRAMUSCULAR | Status: DC | PRN

## 2020-05-26 NOTE — ED Notes (Signed)
Provider made aware of patients blood pressures running low. Will hang 250 ml Bolus per Mansy MD.

## 2020-05-26 NOTE — ED Triage Notes (Signed)
Pt arrives to ED w/ c/o 6/10 centrally located, non-radiating chest pain w/ palpitations and sob on exertion that started on Thursday.

## 2020-05-26 NOTE — H&P (Signed)
[facility] NAME: [name]    MR#:  [id number]  DATE OF BIRTH:  08-13-1971  DATE OF ADMISSION:  05/26/2020  PRIMARY CARE PHYSICIAN: [name], [name], MD   REQUESTING/REFERRING PHYSICIAN: Valarie Merino, MD  CHIEF COMPLAINT:   Chief Complaint  Patient presents with  . Chest Pain    HISTORY OF PRESENT ILLNESS:  Taylor Black  is a 49 y.o. female with a known history of coronary artery disease, anxiety/depression and GERD, presented to the emergency room because of palpitations and fatigue as well as dyspnea on exertion with occasional chest pain with exertion and recent bright red bleeding per rectum since Thursday which has stopped.  She denies any melena.  No nausea or vomiting or abdominal pain or heartburn.  No fever or chills.  No cough or wheezing or hemoptysis.  No headache or dizziness or blurred vision.  No dysuria, oliguria or hematuria or flank pain.  No other bleeding diathesis.  Upon presentation to the emergency room, blood pressure was 104/63 with a pulse of 98.8 and BP was later 97/56 and then 92/52.  Labs revealed hemoglobin of 5.3 and hematocrit 18.6 down from 12.1/41 on 06/13/2019.  BMP was unremarkable.  High-sensitivity troponin I was 3.  Serum pregnancy test was negative.  Two-view chest x-ray showed no acute cardiopulmonary disease.  EKG showed normal sinus rhythm with a rate of 77.  The patient was given IV Protonix bolus and drip.  She was typed and crossmatched and will be transfused 2 units of packed red blood cells.  She will be admitted to a progressive unit bed for further evaluation and management. PAST MEDICAL HISTORY:   Past Medical History:  Diagnosis Date  . Anxiety   . 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 Chicken pox   . Depression   . GERD (gastroesophageal reflux disease)   . LV dysfunction  06/13/2019    PAST SURGICAL HISTORY:   Past Surgical History:    Procedure Laterality Date  . bowel obstruction  1989  . LEFT HEART CATH AND CORONARY ANGIOGRAPHY N/A 06/13/2019   Procedure: LEFT HEART CATH AND CORONARY ANGIOGRAPHY;  Surgeon: Belva Crome, MD;  Location: [facility];  Service: Cardiovascular;  Laterality: N/A;  . UMBILICAL HERNIA REPAIR  2017    SOCIAL HISTORY:   Social History   Tobacco Use  . Smoking status: Current Every Day Smoker  . Smokeless tobacco: Never Used  Substance Use Topics  . Alcohol use: Yes    FAMILY HISTORY:   Family History  Problem Relation Age of Onset  . Cancer Mother   . Heart disease Father   . Stroke Father   . Hypertension Father   . Kidney disease Father   . Diabetes Father   . Alcohol abuse Father   . Depression Paternal Grandmother   . Kidney disease Paternal Grandmother     DRUG ALLERGIES:   Allergies  Allergen Reactions  . Morphine And Related Hives and Other (See Comments)    Profuse sweating    REVIEW OF SYSTEMS:   ROS As per history of present illness. All pertinent systems were reviewed above. Constitutional, HEENT, cardiovascular, respiratory, GI, GU, musculoskeletal, neuro, psychiatric, endocrine, integumentary and hematologic systems were reviewed and are otherwise negative/unremarkable except for positive findings mentioned above in the HPI.   MEDICATIONS AT HOME:   Prior to Admission medications   Medication Sig  Start Date End Date Taking? Authorizing Provider  aspirin EC 81 MG tablet Take 81 mg by mouth daily with breakfast. Swallow whole.   Yes [provider]  Aspirin-Salicylamide-Caffeine (BC HEADACHE POWDER PO) Take 2 packets by mouth daily as needed (sleep).   Yes [provider]  atorvastatin (LIPITOR) 20 MG tablet Take 1 tablet (20 mg total) by mouth daily at 6 PM. Patient taking differently: Take 20 mg by mouth daily with supper.  09/22/19  Yes [name], [name], MD  carvedilol (COREG) 25 MG tablet Take 1 tablet (25 mg total) by mouth  2 (two) times daily with a meal. 09/22/19  Yes [name], [name], MD  losartan (COZAAR) 100 MG tablet TAKE 1 TABLET DAILY Patient taking differently: Take 100 mg by mouth daily with breakfast.  03/07/20  Yes [name], [name], MD  omeprazole (PRILOSEC) 40 MG capsule Take 1 capsule (40 mg total) by mouth daily. Patient taking differently: Take 40 mg by mouth daily with breakfast.  06/05/17  Yes [name], [name], MD  acetaminophen (TYLENOL) 325 MG tablet Take 2 tablets (650 mg total) by mouth every 4 (four) hours as needed for headache or mild pain. Patient not taking: Reported on 05/26/2020 06/13/19   Isaiah Serge, NP      VITAL SIGNS:  Blood pressure (!) 101/55, pulse 69, temperature 98.8 F (37.1 C), temperature source Oral, resp. rate 17, height 5' (1.524 m), weight 90.7 kg, SpO2 100 %.  PHYSICAL EXAMINATION:  Physical Exam  GENERAL:  49 y.o.-year-old patient lying in the bed with mild conversational dyspnea.   EYES: Pupils equal, round, reactive to light and accommodation. No scleral icterus.  Positive pallor.  Extraocular muscles intact.  HEENT: Head atraumatic, normocephalic. Oropharynx and nasopharynx clear.  NECK:  Supple, no jugular venous distention. No thyroid enlargement, no tenderness.  LUNGS: Normal breath sounds bilaterally, no wheezing, rales,rhonchi or crepitation. No use of accessory muscles of respiration.  CARDIOVASCULAR: Regular rate and rhythm, S1, S2 normal. No murmurs, rubs, or gallops.  ABDOMEN: Soft, nondistended, nontender. Bowel sounds present. No organomegaly or mass.  EXTREMITIES: No pedal edema, cyanosis, or clubbing.  NEUROLOGIC: Cranial nerves II through XII are intact. Muscle strength 5/5 in all extremities. Sensation intact. Gait not checked.  PSYCHIATRIC: The patient is alert and oriented x 3.  Normal affect and good eye contact. SKIN: No obvious rash, lesion, or ulcer.   LABORATORY PANEL:   CBC Recent Labs  Lab 05/26/20 1520  WBC 8.4  HGB 5.3*  HCT  18.6*  PLT 352   ------------------------------------------------------------------------------------------------------------------  Chemistries  Recent Labs  Lab 05/26/20 1520  NA 140  K 4.0  CL 112*  CO2 21*  GLUCOSE 117*  BUN 17  CREATININE 0.86  CALCIUM 8.5*   ------------------------------------------------------------------------------------------------------------------  Cardiac Enzymes No results for input(s): TROPONINI in the last 168 hours. ------------------------------------------------------------------------------------------------------------------  RADIOLOGY:  DG Chest 2 View  Result Date: 05/26/2020 CLINICAL DATA:  Chest pain with palpitations and shortness of breath on exertion EXAM: CHEST - 2 VIEW COMPARISON:  Radiograph 06/12/2019 FINDINGS: No consolidation, features of edema, pneumothorax, or effusion. Pulmonary vascularity is normally distributed. The cardiomediastinal contours are unremarkable. No acute osseous or soft tissue abnormality. Degenerative changes are present in the imaged spine and shoulders. IMPRESSION: No acute cardiopulmonary abnormality. Electronically Signed   By: Lovena Le M.D.   On: 05/26/2020 16:07      IMPRESSION AND PLAN:   1.  GI bleeding likely of upper GI etiology with inability to  rule out lower GI etiology, given bright red bleeding per rectum with subsequent acute blood loss symptomatic anemia. -The patient will be admitted to a progressive unit bed. -We will follow serial hemoglobins and hematocrits. -She was typed and crossmatch will be transfused 2 units of packed red blood cells. -We will follow posttransfusion H&H. -We will place her on IV Protonix infusion after 80 mg of IV bolus. -Aspirin will be stopped as well as BC powder.  I counseled her to avoid BC powder in the future.. -A GI consultation will be obtained. -Dr. Scarlette Shorts was notified about the patient.  2.  Hypertension. -We will continue Coreg with  holding parameters. -We will hold off Cozaar.  3.  Dyslipidemia. -We will continue statin therapy.  4.  DVT prophylaxis. -SCDs. -Medical prophylaxis currently contraindicated due to GI bleeding.  5.  GI prophylaxis. -This was addressed above with IV PPI therapy.   All the records are reviewed and case discussed with ED provider. The plan of care was discussed in details with the patient (and family). I answered all questions. The patient agreed to proceed with the above mentioned plan. Further management will depend upon hospital course.   CODE STATUS: Full code  Status is: Inpatient  Remains inpatient appropriate because:Ongoing diagnostic testing needed not appropriate for outpatient work up, Unsafe d/c plan, IV treatments appropriate due to intensity of illness or inability to take PO and Inpatient level of care appropriate due to severity of illness The patient is having GI bleeding that will require further investigations including open and possibly lower GI endoscopy, transfusion of packed red blood cells and monitoring of serial hemoglobins and hematocrits.  She will likely need more than 2 midnights of hospital stay.  Dispo: The patient is from: Home              Anticipated d/c is to: Home              Anticipated d/c date is: 2 days              Patient currently is not medically stable to d/c.    TOTAL TIME TAKING CARE OF THIS PATIENT: 55 minutes.    Christel Mormon M.D on 05/26/2020 at 6:51 PM  [facility]   From 7 PM-7 AM, contact night-coverage [email or web address]  CC: Primary care physician; [name], [name], MD   Note: This dictation was prepared with Dragon dictation along with smaller phrase technology. Any transcriptional typo errors that result from this process are unintentional.

## 2020-05-26 NOTE — ED Provider Notes (Signed)
[facility] EMERGENCY DEPARTMENT Provider Note   CSN: [id number] Arrival date & time: 05/26/20  1410     History Chief Complaint  Patient presents with  . Chest Pain    Taylor Black is a 49 y.o. female.  49 year old female with prior medical history detailed below presents for evaluation of reported palpitations, fatigue, dyspnea on exertion.  Patient reports 3 days of bright red blood per rectum earlier this week.  She reports no further rectal bleeding over the last 48 hours.  She reports that her symptoms of fatigue and weakness began while she was having rectal bleeding.  Also, she reports the initiation of her menstrual period over the last 24 hours.  She denies significant history of heavy menses in the past.  She denies prior history of significant GI bleed.  She takes a baby aspirin daily.  The history is provided by the patient.  Illness Severity:  Moderate Onset quality:  Gradual Duration:  1 week Timing:  Constant Progression:  Unchanged Chronicity:  New Associated symptoms: fatigue and shortness of breath   Associated symptoms: no chest pain        Past Medical History:  Diagnosis Date  . Anxiety   . 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 Chicken pox   . Depression   . GERD (gastroesophageal reflux disease)   . LV dysfunction  06/13/2019    Patient Active Problem List   Diagnosis Date Noted  . GI bleeding 05/26/2020  . Unstable angina ([place]) due to hypertension  06/13/2019  . HTN (hypertension) 06/13/2019  . Tobacco use disorder 06/13/2019  . 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 LV dysfunction  06/13/2019  . NSTEMI (non-ST elevated myocardial infarction) ([place]) 06/12/2019  . Knee pain, right 06/05/2017  . 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 GERD (gastroesophageal reflux disease) 06/05/2017  . Obesity, morbid, BMI 40.0-49.9 ([place]) 06/05/2017     Past Surgical History:  Procedure Laterality Date  . bowel obstruction  1989  . LEFT HEART CATH AND CORONARY ANGIOGRAPHY N/A 06/13/2019   Procedure: LEFT HEART CATH AND CORONARY ANGIOGRAPHY;  Surgeon: Belva Crome, MD;  Location: [facility];  Service: Cardiovascular;  Laterality: N/A;  . UMBILICAL HERNIA REPAIR  2017     OB History   No obstetric history on file.     Family History  Problem Relation Age of Onset  . Cancer Mother   . Heart disease Father   . Stroke Father   . Hypertension Father   . Kidney disease Father   . Diabetes Father   . Alcohol abuse Father   . Depression Paternal Grandmother   . Kidney disease Paternal Grandmother     Social History   Tobacco Use  . Smoking status: Current Every Day Smoker  . Smokeless tobacco: Never Used  Substance Use Topics  . Alcohol use: Yes  . Drug use: Yes    Types: Marijuana    Home Medications Prior to Admission medications   Medication Sig Start Date End Date Taking? Authorizing Provider  aspirin EC 81 MG tablet Take 81 mg by mouth daily with breakfast. Swallow whole.   Yes [provider]  Aspirin-Salicylamide-Caffeine (BC HEADACHE POWDER PO) Take 2 packets by mouth daily as needed (sleep).   Yes [provider]  atorvastatin (LIPITOR) 20 MG tablet Take 1 tablet (20 mg total) by mouth daily at 6  PM. Patient taking differently: Take 20 mg by mouth daily with supper.  09/22/19  Yes [name], [name], MD  carvedilol (COREG) 25 MG tablet Take 1 tablet (25 mg total) by mouth 2 (two) times daily with a meal. 09/22/19  Yes [name], [name], MD  losartan (COZAAR) 100 MG tablet TAKE 1 TABLET DAILY Patient taking differently: Take 100 mg by mouth daily with breakfast.  03/07/20  Yes [name], [name], MD  omeprazole (PRILOSEC) 40 MG capsule Take 1 capsule (40 mg total) by mouth daily. Patient taking differently: Take 40 mg by mouth daily with breakfast.  06/05/17  Yes [name], [name], MD  acetaminophen  (TYLENOL) 325 MG tablet Take 2 tablets (650 mg total) by mouth every 4 (four) hours as needed for headache or mild pain. Patient not taking: Reported on 05/26/2020 06/13/19   Isaiah Serge, NP    Allergies    Morphine and related  Review of Systems   Review of Systems  Constitutional: Positive for fatigue.  Respiratory: Positive for shortness of breath.   Cardiovascular: Negative for chest pain.  All other systems reviewed and are negative.   Physical Exam Updated Vital Signs BP (!) 101/55 (BP Location: Right Arm)   Pulse 69   Temp 98.8 F (37.1 C) (Oral)   Resp 17   Ht 5' (1.524 m)   Wt 90.7 kg   SpO2 100%   BMI 39.06 kg/m   Physical Exam Vitals and nursing note reviewed.  Constitutional:      General: She is not in acute distress.    Appearance: She is well-developed.  HENT:     Head: Normocephalic and atraumatic.  Eyes:     Conjunctiva/sclera: Conjunctivae normal.     Pupils: Pupils are equal, round, and reactive to light.  Cardiovascular:     Rate and Rhythm: Normal rate and regular rhythm.     Heart sounds: Normal heart sounds.  Pulmonary:     Effort: Pulmonary effort is normal. No respiratory distress.     Breath sounds: Normal breath sounds.  Abdominal:     General: There is no distension.     Palpations: Abdomen is soft.     Tenderness: There is no abdominal tenderness.  Genitourinary:    Comments: Trace melanotic stool   Musculoskeletal:        General: No deformity. Normal range of motion.     Cervical back: Normal range of motion and neck supple.  Skin:    General: Skin is warm and dry.  Neurological:     Mental Status: She is alert and oriented to person, place, and time.     ED Results / Procedures / Treatments   Labs (all labs ordered are listed, but only abnormal results are displayed) Labs Reviewed  BASIC METABOLIC PANEL - Abnormal; Notable for the following components:      Result Value   Chloride 112 (*)    CO2 21 (*)    Glucose,  Bld 117 (*)    Calcium 8.5 (*)    All other components within normal limits  CBC - Abnormal; Notable for the following components:   RBC 2.36 (*)    Hemoglobin 5.3 (*)    HCT 18.6 (*)    MCV 78.8 (*)    MCH 22.5 (*)    MCHC 28.5 (*)    RDW 18.1 (*)    All other components within normal limits  SARS CORONAVIRUS 2 BY RT PCR (HOSPITAL ORDER, [facility])  BASIC METABOLIC PANEL  CBC  HEMOGLOBIN AND HEMATOCRIT, BLOOD  HEMOGLOBIN AND HEMATOCRIT, BLOOD  I-STAT BETA HCG BLOOD, ED ([facility], [facility], [facility] ONLY)  POC OCCULT BLOOD, ED  PREPARE RBC (CROSSMATCH)  TYPE AND SCREEN  ABO/RH  TROPONIN I (HIGH SENSITIVITY)  TROPONIN I (HIGH SENSITIVITY)    EKG EKG Interpretation  Date/Time:  Saturday May 26 2020 15:15:27 EDT Ventricular Rate:  77 PR Interval:  164 QRS Duration: 88 QT Interval:  386 QTC Calculation: 436 R Axis:   0 Text Interpretation: Normal sinus rhythm Normal ECG Confirmed by Dene Gentry [phone]) on 05/26/2020 4:49:35 PM   Radiology DG Chest 2 View  Result Date: 05/26/2020 CLINICAL DATA:  Chest pain with palpitations and shortness of breath on exertion EXAM: CHEST - 2 VIEW COMPARISON:  Radiograph 06/12/2019 FINDINGS: No consolidation, features of edema, pneumothorax, or effusion. Pulmonary vascularity is normally distributed. The cardiomediastinal contours are unremarkable. No acute osseous or soft tissue abnormality. Degenerative changes are present in the imaged spine and shoulders. IMPRESSION: No acute cardiopulmonary abnormality. Electronically Signed   By: Lovena Le M.D.   On: 05/26/2020 16:07    Procedures Procedures (including critical care time) CRITICAL CARE Performed by: Valarie Merino   Total critical care time: 30 minutes  Critical care time was exclusive of separately billable procedures and treating other patients.  Critical care was necessary to treat or prevent imminent or life-threatening deterioration.  Critical care was  time spent personally by me on the following activities: development of treatment plan with patient and/or surrogate as well as nursing, discussions with consultants, evaluation of patient's response to treatment, examination of patient, obtaining history from patient or surrogate, ordering and performing treatments and interventions, ordering and review of laboratory studies, ordering and review of radiographic studies, pulse oximetry and re-evaluation of patient's condition.   Medications Ordered in ED Medications  sodium chloride flush (NS) 0.9 % injection 3 mL (has no administration in time range)  pantoprazole (PROTONIX) 80 mg in sodium chloride 0.9 % 100 mL IVPB (has no administration in time range)  0.9 %  sodium chloride infusion (has no administration in time range)    ED Course  I have reviewed the triage vital signs and the nursing notes.  Pertinent labs & imaging results that were available during my care of the patient were reviewed by me and considered in my medical decision making (see chart for details).    MDM Rules/Calculators/A&P                          MDM  Screen complete  Taylor Black was evaluated in Emergency Department on 05/26/2020 for the symptoms described in the history of present illness. She was evaluated in the context of the global COVID-19 pandemic, which necessitated consideration that the patient might be at risk for infection with the SARS-CoV-2 virus that causes COVID-19. Institutional protocols and algorithms that pertain to the evaluation of patients at risk for COVID-19 are in a state of rapid change based on information released by regulatory bodies including the CDC and federal and state organizations. These policies and algorithms were followed during the patient's care in the ED.   Patient is presenting for evaluation of reported fatigue and weakness.  Patient appears to have symptomatic anemia secondary to presumed GI  bleed.  Patient will require admission for transfusion and further GI evaluation.  Medicine service is aware of case and will evaluate for  admission.   Final Clinical Impression(s) / ED Diagnoses Final diagnoses:  Anemia, unspecified type  Gastrointestinal hemorrhage, unspecified gastrointestinal hemorrhage type    Rx / DC Orders ED Discharge Orders    None       Valarie Merino, MD 05/26/20 [phone]

## 2020-05-27 ENCOUNTER — Other Ambulatory Visit: Payer: Self-pay

## 2020-05-27 ENCOUNTER — Inpatient Hospital Stay (HOSPITAL_COMMUNITY): Payer: 59

## 2020-05-27 ENCOUNTER — Encounter (HOSPITAL_COMMUNITY): Payer: Self-pay | Admitting: Family Medicine

## 2020-05-27 DIAGNOSIS — R935 Abnormal findings on diagnostic imaging of other abdominal regions, including retroperitoneum: Secondary | ICD-10-CM

## 2020-05-27 DIAGNOSIS — D62 Acute posthemorrhagic anemia: Secondary | ICD-10-CM

## 2020-05-27 DIAGNOSIS — K921 Melena: Secondary | ICD-10-CM

## 2020-05-27 LAB — HEMOGLOBIN AND HEMATOCRIT, BLOOD
HCT: 22.7 % — ABNORMAL LOW (ref 36.0–46.0)
HCT: 22.6 % — ABNORMAL LOW (ref 36.0–46.0)
HCT: 25.4 % — ABNORMAL LOW (ref 36.0–46.0)
Hemoglobin: 7.2 g/dL — ABNORMAL LOW (ref 12.0–15.0)
Hemoglobin: 7.2 g/dL — ABNORMAL LOW (ref 12.0–15.0)
Hemoglobin: 8 g/dL — ABNORMAL LOW (ref 12.0–15.0)

## 2020-05-27 LAB — BASIC METABOLIC PANEL
Anion gap: 7 (ref 5–15)
BUN: 8 mg/dL (ref 6–20)
CO2: 22 mmol/L (ref 22–32)
Calcium: 8 mg/dL — ABNORMAL LOW (ref 8.9–10.3)
Chloride: 113 mmol/L — ABNORMAL HIGH (ref 98–111)
Creatinine, Ser: 0.82 mg/dL (ref 0.44–1.00)
GFR calc Af Amer: >60 mL/min (ref >60)
GFR calc non Af Amer: >60 mL/min (ref >60)
Glucose, Bld: 108 mg/dL — ABNORMAL HIGH (ref 70–99)
Potassium: 3.9 mmol/L (ref 3.5–5.1)
Sodium: 142 mmol/L (ref 135–145)

## 2020-05-27 LAB — CBC
HCT: 23.8 % — ABNORMAL LOW (ref 36.0–46.0)
Hemoglobin: 7.5 g/dL — ABNORMAL LOW (ref 12.0–15.0)
MCH: 24.9 pg — ABNORMAL LOW (ref 26.0–34.0)
MCHC: 31.5 g/dL (ref 30.0–36.0)
MCV: 79.1 fL — ABNORMAL LOW (ref 80.0–100.0)
Platelets: 320 10*3/uL (ref 150–400)
RBC: 3.01 MIL/uL — ABNORMAL LOW (ref 3.87–5.11)
RDW: 17.9 % — ABNORMAL HIGH (ref 11.5–15.5)
WBC: 6.6 10*3/uL (ref 4.0–10.5)
nRBC: 0 % (ref 0.0–0.2)

## 2020-05-27 MED ORDER — TECHNETIUM TC 99M-LABELED RED BLOOD CELLS IV KIT
25.0000 | PACK | Freq: Once | INTRAVENOUS | Status: AC | PRN
  Administered 2020-05-27: 25 via INTRAVENOUS

## 2020-05-27 MED ORDER — HYDRALAZINE HCL 20 MG/ML IJ SOLN: 10.0000 mg | Freq: Four times a day (QID) | INTRAMUSCULAR | Status: DC | PRN

## 2020-05-27 MED ORDER — SODIUM CHLORIDE 0.9 % IV SOLN: INTRAVENOUS | Status: DC

## 2020-05-27 MED ORDER — CARVEDILOL 3.125 MG PO TABS
3.1250 mg | ORAL_TABLET | Freq: Two times a day (BID) | ORAL | Status: DC
  Administered 2020-05-28 – 2020-05-29 (×3): 3.125 mg via ORAL
  Filled 2020-05-27 (×3): qty 1

## 2020-05-27 MED ORDER — ACETAMINOPHEN 500 MG PO TABS
500.0000 mg | ORAL_TABLET | Freq: Four times a day (QID) | ORAL | Status: DC | PRN
  Administered 2020-05-27 (×2): 500 mg via ORAL
  Filled 2020-05-27 (×2): qty 1

## 2020-05-27 MED ORDER — IOHEXOL 300 MG/ML  SOLN
100.0000 mL | Freq: Once | INTRAMUSCULAR | Status: AC | PRN
  Administered 2020-05-27: 100 mL via INTRAVENOUS

## 2020-05-27 NOTE — Plan of Care (Signed)
New admit

## 2020-05-27 NOTE — Progress Notes (Signed)
PT Cancellation Note  Patient Details Name: Taylor Black MRN: [id number] DOB: 1971/07/22   Cancelled Treatment:    Reason Eval/Treat Not Completed: Patient at procedure or test/unavailable  Will follow,  Roney Marion, PT  Acute Rehabilitation Services Pager [phone] Office [phone]   [place]ta [place] 05/27/2020, 4:44 PM

## 2020-05-27 NOTE — Consult Note (Addendum)
Consultation  Referring Provider: [facility]/ Candiss Norse Primary Care Physician:  [name], [name], MD Primary Gastroenterologist:unassigned  Reason for Consultation: Severe anemia and rectal bleeding  HPI: Taylor Black is a 49 y.o. female, with history of nonocclusive coronary artery disease, GERD, depression and anxiety who presented to the emergency room at [facility] yesterday with complaints of palpitations fatigue and increased shortness of breath with exertion.  Patient relates that she had onset of bleeding on Monday, 05/21/2020.  She says she passed a large amount of dark red bloody stool.  She continued to have 1-2 bowel movements each day through Thursday.  She says by that point she had become extremely weak and was still seeing blood which had "lightened up".  She did not have any associated abdominal pain at all or cramping, appetite has been fine, no nausea or vomiting.  She had not noticed any recent changes in her bowel habits.  No fever or chills. She takes baby aspirin daily has not been on any other regular aspirin or NSAIDs. On Friday she did not have a bowel movement stayed home because she was feeling so weak, then yesterday passed a large amount of grossly bloody stool again.  She is not had any prior GI evaluation and no prior history of GI bleeding.  She did have a bowel obstruction at age 65 felt secondary to adhesions. Labs were done and found to have hemoglobin of 5.3, troponins were negative.  Patient with baseline hemoglobin of 12 in August 2020  She was transferred to [facility]-transfused 2 units of packed RBCs and hemoglobin up to 7.5 today.  She had passed further bloody stool this morning and bleeding scan ordered.  Bleeding scan is negative for evidence of active GI bleeding however shows abnormal increased pool of labeled red blood cells projecting over the aortic bifurcation sacrum and supravesicular region throughout the exam could be related to hypervascular  mass, enlarged uterus etc. recommended CT         Past Medical History:  Diagnosis Date  . Anxiety   . 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 Chicken pox   . Depression   . GERD (gastroesophageal reflux disease)   . LV dysfunction  06/13/2019    Past Surgical History:  Procedure Laterality Date  . bowel obstruction  1989  . LEFT HEART CATH AND CORONARY ANGIOGRAPHY N/A 06/13/2019   Procedure: LEFT HEART CATH AND CORONARY ANGIOGRAPHY;  Surgeon: Belva Crome, MD;  Location: [facility];  Service: Cardiovascular;  Laterality: N/A;  . UMBILICAL HERNIA REPAIR  2017    Prior to Admission medications   Medication Sig Start Date End Date Taking? Authorizing Provider  aspirin EC 81 MG tablet Take 81 mg by mouth daily with breakfast. Swallow whole.   Yes [provider]  Aspirin-Salicylamide-Caffeine (BC HEADACHE POWDER PO) Take 2 packets by mouth daily as needed (sleep).   Yes [provider]  atorvastatin (LIPITOR) 20 MG tablet Take 1 tablet (20 mg total) by mouth daily at 6 PM. Patient taking differently: Take 20 mg by mouth daily with supper.  09/22/19  Yes [name], [name], MD  carvedilol (COREG) 25 MG tablet Take 1 tablet (25 mg total) by mouth 2 (two) times daily with a meal. 09/22/19  Yes [name], [name], MD  losartan (COZAAR) 100 MG tablet TAKE 1 TABLET DAILY Patient taking differently: Take 100 mg by mouth daily with breakfast.  03/07/20  Yes  [name], [name], MD  omeprazole (PRILOSEC) 40 MG capsule Take 1 capsule (40 mg total) by mouth daily. Patient taking differently: Take 40 mg by mouth daily with breakfast.  06/05/17  Yes [name], [name], MD  acetaminophen (TYLENOL) 325 MG tablet Take 2 tablets (650 mg total) by mouth every 4 (four) hours as needed for headache or mild pain. Patient not taking: Reported on 05/26/2020 06/13/19   Isaiah Serge, NP    Current Facility-Administered Medications  Medication Dose Route Frequency  Provider Last Rate Last Admin  . 0.9 %  sodium chloride infusion   Intravenous Continuous Thurnell Lose, MD 75 mL/hr at 05/27/20 0831 New Bag at 05/27/20 0831  . acetaminophen (TYLENOL) suppository 650 mg  650 mg Rectal Q6H PRN Mansy, Jan A, MD      . acetaminophen (TYLENOL) tablet 500 mg  500 mg Oral Q6H PRN Thurnell Lose, MD   500 mg at 05/27/20 1146  . atorvastatin (LIPITOR) tablet 20 mg  20 mg Oral Q supper Mansy, Jan A, MD      . Derrill Memo ON 05/28/2020] carvedilol (COREG) tablet 3.125 mg  3.125 mg Oral BID WC Thurnell Lose, MD      . hydrALAZINE (APRESOLINE) injection 10 mg  10 mg Intravenous Q6H PRN Thurnell Lose, MD      . ondansetron [facility]) tablet 4 mg  4 mg Oral Q6H PRN Mansy, Jan A, MD       Or  . ondansetron [facility]) injection 4 mg  4 mg Intravenous Q6H PRN Mansy, Jan A, MD      . pantoprazole (PROTONIX) 80 mg in sodium chloride 0.9 % 100 mL (0.8 mg/mL) infusion  8 mg/hr Intravenous Continuous Mansy, Jan A, MD 10 mL/hr at 05/27/20 0850 8 mg/hr at 05/27/20 0850  . [START ON 05/30/2020] pantoprazole (PROTONIX) injection 40 mg  40 mg Intravenous Q12H Mansy, Jan A, MD      . traZODone (DESYREL) tablet 25 mg  25 mg Oral QHS PRN Mansy, Jan A, MD   25 mg at 05/27/20 0045    Allergies as of 05/26/2020 - Review Complete 05/26/2020  Allergen Reaction Noted  . Morphine and related Hives and Other (See Comments) 06/05/2017    Family History  Problem Relation Age of Onset  . Cancer Mother   . Heart disease Father   . Stroke Father   . Hypertension Father   . Kidney disease Father   . Diabetes Father   . Alcohol abuse Father   . Depression Paternal Grandmother   . Kidney disease Paternal Grandmother     Social History   Socioeconomic History  . Marital status: Legally Separated    Spouse name: Not on file  . Number of children: Not on file  . Years of education: Not on file  . Highest education level: Not on file  Occupational History  . Not on file  Tobacco Use    . Smoking status: Current Every Day Smoker  . Smokeless tobacco: Never Used  Substance and Sexual Activity  . Alcohol use: Yes  . Drug use: Yes    Types: Marijuana  . Sexual activity: Not Currently  Other Topics Concern  . Not on file  Social History Narrative  . Not on file   Social Determinants of Health   Financial Resource Strain:   . Difficulty of Paying Living Expenses:   Food Insecurity:   . Worried About Charity fundraiser in the Last Year:   .  Ran Out of Food in the Last Year:   Transportation Needs:   . Film/video editor (Medical):   Marland Kitchen Lack of Transportation (Non-Medical):   Physical Activity:   . Days of Exercise per Week:   . Minutes of Exercise per Session:   Stress:   . Feeling of Stress :   Social Connections:   . Frequency of Communication with Friends and Family:   . Frequency of Social Gatherings with Friends and Family:   . Attends Religious Services:   . Active Member of Clubs or Organizations:   . Attends Archivist Meetings:   Marland Kitchen Marital Status:   Intimate Partner Violence:   . Fear of Current or Ex-Partner:   . Emotionally Abused:   Marland Kitchen Physically Abused:   . Sexually Abused:     Review of Systems: Pertinent positive and negative review of systems were noted in the above HPI section.  All other review of systems was otherwise negative. Physical Exam: Vital signs in last 24 hours: Temp:  [97.6 F (36.4 C)-98.8 F (37.1 C)] 98.4 F (36.9 C) (07/25 1150) Pulse Rate:  [60-76] 60 (07/25 1150) Resp:  [13-23] 23 (07/25 1150) BP: (82-120)/(47-71) 109/66 (07/25 1150) SpO2:  [95 %-100 %] 97 % (07/25 1150) Weight:  [90.7 kg] 90.7 kg (07/24 1516) Last BM Date: 05/26/20 General:   Alert,  Well-developed, well-nourished African-American female, pleasant and cooperative in NAD Head:  Normocephalic and atraumatic. Eyes:  Sclera clear, no icterus.   Conjunctiva pink. Ears:  Normal auditory acuity. Nose:  No deformity, discharge,  or  lesions. Mouth:  No deformity or lesions.   Neck:  Supple; no masses or thyromegaly. Lungs:  Clear throughout to auscultation.   No wheezes, crackles, or rhonchi. Heart:  Regular rate and rhythm; no murmurs, clicks, rubs,  or gallops. Abdomen:  Soft,nontender, BS active,nonpalp mass or hsm.  Midline incisional scar Rectal:  Deferred  Msk:  Symmetrical without gross deformities. . Pulses:  Normal pulses noted. Extremities:  Without clubbing or edema. Neurologic:  Alert and  oriented x4;  grossly normal neurologically. Skin:  Intact without significant lesions or rashes.. Psych:  Alert and cooperative. Normal mood and affect.  Intake/Output from previous day: 07/24 0701 - 07/25 0700 In: 1974.1 [I.V.:1152.4; Blood:721.8; IV Piggyback:100] Out: -  Intake/Output this shift: No intake/output data recorded.  Lab Results: Recent Labs    05/26/20 1520 05/26/20 2031 05/27/20 0357 05/27/20 0656 05/27/20 1152  WBC 8.4  --   --  6.6  --   HGB 5.3*   < > 7.2* 7.5* 7.2*  HCT 18.6*   < > 22.7* 23.8* 22.6*  PLT 352  --   --  320  --    < > = values in this interval not displayed.   BMET Recent Labs    05/26/20 1520 05/27/20 0656  NA 140 142  K 4.0 3.9  CL 112* 113*  CO2 21* 22  GLUCOSE 117* 108*  BUN 17 8  CREATININE 0.86 0.82  CALCIUM 8.5* 8.0*   LFT No results for input(s): PROT, ALBUMIN, AST, ALT, ALKPHOS, BILITOT, BILIDIR, IBILI in the last 72 hours. PT/INR No results for input(s): LABPROT, INR in the last 72 hours. Hepatitis Panel No results for input(s): HEPBSAG, HCVAB, HEPAIGM, HEPBIGM in the last 72 hours.        IMPRESSION:  Number one 49 year old African-American female with 6-day history of rectal bleeding.  Patient had onset on 05/21/2020 with initial painless bowel movement of large  amount of dark red bloody stool.  She had persistent bleeding throughout the week and eventually developed significant weakness and shortness of breath. Hemoglobin 5.3 on  admission.  No prior GI evaluation or history of GI bleeding Suspect lower GI source for blood loss by history Bleeding scan this afternoon negative for active bleeding, however concern for possible hypervascular mass in the lower abdomen with abnormal increased pole of labeled red blood cells as outlined above.  Rule out colonic or pelvic neoplasm, rule out diverticular bleed  #2 profound anemia secondary to above-improved post transfusions #3 nonobstructive coronary artery disease #4 history of GERD 5.  History of hypertension 6.  Remote history of bowel obstruction secondary to adhesions age 89  Plan; continue clear liquids Every 6 hour hemoglobins and transfuse to keep hemoglobin between 7 and 8 CT of the abdomen and pelvis has been ordered for this evening Await CT results, anticipate she will need colonoscopy plus minus EGD, tomorrow afternoon versus Tuesday. Plans discussed with patient in detail, she voices understanding and is in agreement with plan.  Amy Esterwood PA-C 05/27/2020, 2:23 PM  GI ATTENDING  History, laboratories, x-rays reviewed.  Patient personally seen and examined.  Agree with comprehensive consultation note as outlined above.  Patient has been having maroon stools intermittently all week.  Comes in with significant symptomatic anemia.  Hemoglobin down 7 g from baseline.  Has been transfused.  Tagged red blood cell scan shows pooling of activity in the pelvis near the aortic bifurcation and sacrum as described.  Proceed to CT to rule out hypervascular mass.  Statistically, this is likely a lower GI bleed of diverticular origin.  She has never had endoscopic procedures performed.  Would plan for colonoscopy/upper endoscopy.  Likely prep tomorrow.  Docia Chuck. Geri Seminole., M.D. [facility] Division of Gastroenterology

## 2020-05-27 NOTE — Progress Notes (Signed)
Call was place d to Nuclear medicine to inform of STAT order.

## 2020-05-27 NOTE — Progress Notes (Signed)
PROGRESS NOTE                                                                                                                                                                                                             Patient Demographics:    Taylor Black, is a 49 y.o. female, DOB - 14-Mar-1971, [id number]  Admit date - 05/26/2020   Admitting Physician Christel Mormon, MD  Outpatient Primary MD for the patient is [name], Malka So, MD  LOS - 1  Chief Complaint  Patient presents with  . Chest Pain       Brief Narrative  - Taylor Black  is a 49 y.o. female with a known history of coronary artery disease, anxiety/depression and GERD, presented to the emergency room because of palpitations and fatigue as well as dyspnea on exertion with occasional chest pain with exertion and recent bright red bleeding per rectum off-and-on ongoing for 3 days, in the ER was found to be severely anemic hypotensive and admitted to the hospital.   Subjective:    Nunzio Cobbs today has, No headache, No chest pain, No abdominal pain - No Nausea, No new weakness tingling or numbness, No Cough - SOB. Overall feels better after 2 units of packed RBC transfusion.   Assessment  & Plan :    Active Problems:   GI bleeding   1. Symptomatic anemia due to GI bleed. BUN is stable and no upper GI symptoms, she is having bright red blood per rectum, could most likely be lower GI bleed. She s/p 2 units packed RBC transfusion, n.p.o., IV PPI drip, GI on board. We will also get a tagged RBC scan.  2. Nonocclusive CAD. Symptom-free continue beta-blocker at reduced dose along with statin..  3. Essential hypertension. Blood pressure soft hold blood pressure medications except beta-blocker at reduced dose.  4. Morbid obesity BMI 39. Follow with PCP for weight loss.     Condition - Extremely Guarded  Family Communication  :  None  Code Status :  Full  Consults  :  GI  Procedures  :     Tagged RBC  PUD Prophylaxis : PPI  Disposition Plan  :    Status is: Inpatient  Remains inpatient appropriate because:IV treatments appropriate due to intensity of illness or inability to take PO   Dispo: The patient is from: Home              Anticipated d/c is to: Home  Anticipated d/c date is: > 3 days              Patient currently is not medically stable to d/c.  DVT Prophylaxis  :  SCDs    Lab Results  Component Value Date   PLT 320 05/27/2020    Diet :  Diet Order            Diet NPO time specified  Diet effective midnight                  Inpatient Medications Scheduled Meds: . atorvastatin  20 mg Oral Q supper  . carvedilol  25 mg Oral BID WC  . [START ON 05/30/2020] pantoprazole  40 mg Intravenous Q12H   Continuous Infusions: . sodium chloride 75 mL/hr at 05/27/20 0831  . pantoprozole (PROTONIX) infusion 8 mg/hr (05/27/20 0850)   PRN Meds:.[DISCONTINUED] acetaminophen **OR** acetaminophen, hydrALAZINE, ondansetron **OR** ondansetron (ZOFRAN) IV, traZODone  Antibiotics  :   Anti-infectives (From admission, onward)   None          Objective:   Vitals:   05/27/20 0034 05/27/20 0040 05/27/20 0045 05/27/20 0400  BP: (!) 96/56 (!) 96/56 (!) 82/69 (!) 99/59  Pulse: 69 71 72 70  Resp: 20 13 20 17   Temp: 98.1 F (36.7 C)   97.6 F (36.4 C)  TempSrc: Oral   Oral  SpO2: 95% 98% 98% 95%  Weight:      Height:        SpO2: 95 %  Wt Readings from Last 3 Encounters:  05/26/20 90.7 kg  12/14/19 90.7 kg  08/29/19 96.2 kg     Intake/Output Summary (Last 24 hours) at 05/27/2020 0940 Last data filed at 05/27/2020 0400 Gross per 24 hour  Intake 1974.14 ml  Output --  Net 1974.14 ml     Physical Exam  Awake Alert, No new F.N deficits, Normal affect [place].AT,PERRAL Supple Neck,No JVD, No cervical lymphadenopathy appriciated.  Symmetrical Chest wall movement, Good air movement bilaterally, CTAB RRR,No Gallops,Rubs or new Murmurs,  No Parasternal Heave +ve B.Sounds, Abd Soft, No tenderness, No organomegaly appriciated, No rebound - guarding or rigidity. No Cyanosis, Clubbing or edema, No new Rash or bruise      Data Review:    Recent Labs  Lab 05/26/20 1520 05/26/20 2031 05/27/20 0357 05/27/20 0656  WBC 8.4  --   --  6.6  HGB 5.3* 5.4* 7.2* 7.5*  HCT 18.6* 18.7* 22.7* 23.8*  PLT 352  --   --  320  MCV 78.8*  --   --  79.1*  MCH 22.5*  --   --  24.9*  MCHC 28.5*  --   --  31.5  RDW 18.1*  --   --  17.9*    Recent Labs  Lab 05/26/20 1520 05/27/20 0656  NA 140 142  K 4.0 3.9  CL 112* 113*  CO2 21* 22  GLUCOSE 117* 108*  BUN 17 8  CREATININE 0.86 0.82  CALCIUM 8.5* 8.0*    Recent Labs  Lab 05/26/20 1748  SARSCOV2NAA NEGATIVE    ------------------------------------------------------------------------------------------------------------------ No results for input(s): CHOL, HDL, LDLCALC, TRIG, CHOLHDL, LDLDIRECT in the last 72 hours.  Lab Results  Component Value Date   HGBA1C 5.9 (H) 06/13/2019   ------------------------------------------------------------------------------------------------------------------ No results for input(s): TSH, T4TOTAL, T3FREE, THYROIDAB in the last 72 hours.  Invalid input(s): FREET3 ------------------------------------------------------------------------------------------------------------------ No results for input(s): VITAMINB12, FOLATE, FERRITIN, TIBC, IRON, RETICCTPCT in the last 72 hours.  Coagulation profile No  results for input(s): INR, PROTIME in the last 168 hours.  No results for input(s): DDIMER in the last 72 hours.  Cardiac Enzymes No results for input(s): CKMB, TROPONINI, MYOGLOBIN in the last 168 hours.  Invalid input(s): CK ------------------------------------------------------------------------------------------------------------------ No results found for: BNP  Micro Results Recent Results (from the past 240 hour(s))  SARS  Coronavirus 2 by RT PCR (hospital order, performed in [facility] hospital lab) Nasopharyngeal Nasopharyngeal Swab     Status: None   Collection Time: 05/26/20  5:48 PM   Specimen: Nasopharyngeal Swab  Result Value Ref Range Status   SARS Coronavirus 2 NEGATIVE NEGATIVE Final    Comment: (NOTE) SARS-CoV-2 target nucleic acids are NOT DETECTED.  The SARS-CoV-2 RNA is generally detectable in upper and lower respiratory specimens during the acute phase of infection. The lowest concentration of SARS-CoV-2 viral copies this assay can detect is 250 copies / mL. A negative result does not preclude SARS-CoV-2 infection and should not be used as the sole basis for treatment or other patient management decisions.  A negative result may occur with improper specimen collection / handling, submission of specimen other than nasopharyngeal swab, presence of viral mutation(s) within the areas targeted by this assay, and inadequate number of viral copies (<250 copies / mL). A negative result must be combined with clinical observations, patient history, and epidemiological information.  Fact Sheet for Patients:   StrictlyIdeas.no  Fact Sheet for Healthcare Providers: BankingDealers.co.za  This test is not yet approved or  cleared by the [place] FDA and has been authorized for detection and/or diagnosis of SARS-CoV-2 by FDA under an Emergency Use Authorization (EUA).  This EUA will remain in effect (meaning this test can be used) for the duration of the COVID-19 declaration under Section 564(b)(1) of the Act, 21 U.S.C. section 360bbb-3(b)(1), unless the authorization is terminated or revoked sooner.  Performed at [facility], [place] [address], [place], [place] [address]     Radiology Reports DG Chest 2 View  Result Date: 05/26/2020 CLINICAL DATA:  Chest pain with palpitations and shortness of breath on exertion EXAM: CHEST - 2 VIEW  COMPARISON:  Radiograph 06/12/2019 FINDINGS: No consolidation, features of edema, pneumothorax, or effusion. Pulmonary vascularity is normally distributed. The cardiomediastinal contours are unremarkable. No acute osseous or soft tissue abnormality. Degenerative changes are present in the imaged spine and shoulders. IMPRESSION: No acute cardiopulmonary abnormality. Electronically Signed   By: Lovena Le M.D.   On: 05/26/2020 16:07    Time Spent in minutes  30   Lala Lund M.D on 05/27/2020 at 9:40 AM  To page go to [email or web address] - password [facility]

## 2020-05-28 DIAGNOSIS — D649 Anemia, unspecified: Secondary | ICD-10-CM

## 2020-05-28 DIAGNOSIS — D509 Iron deficiency anemia, unspecified: Secondary | ICD-10-CM

## 2020-05-28 DIAGNOSIS — K5730 Diverticulosis of large intestine without perforation or abscess without bleeding: Secondary | ICD-10-CM

## 2020-05-28 DIAGNOSIS — K922 Gastrointestinal hemorrhage, unspecified: Secondary | ICD-10-CM

## 2020-05-28 LAB — CBC WITH DIFFERENTIAL/PLATELET
Abs Immature Granulocytes: 0.03 10*3/uL (ref 0.00–0.07)
Basophils Absolute: 0 10*3/uL (ref 0.0–0.1)
Basophils Relative: 1 %
Eosinophils Absolute: 0.2 10*3/uL (ref 0.0–0.5)
Eosinophils Relative: 3 %
HCT: 26.4 % — ABNORMAL LOW (ref 36.0–46.0)
Hemoglobin: 8.2 g/dL — ABNORMAL LOW (ref 12.0–15.0)
Immature Granulocytes: 0 %
Lymphocytes Relative: 16 %
Lymphs Abs: 1.1 10*3/uL (ref 0.7–4.0)
MCH: 24.6 pg — ABNORMAL LOW (ref 26.0–34.0)
MCHC: 31.1 g/dL (ref 30.0–36.0)
MCV: 79.3 fL — ABNORMAL LOW (ref 80.0–100.0)
Monocytes Absolute: 0.6 10*3/uL (ref 0.1–1.0)
Monocytes Relative: 9 %
Neutro Abs: 4.8 10*3/uL (ref 1.7–7.7)
Neutrophils Relative %: 71 %
Platelets: 401 10*3/uL — ABNORMAL HIGH (ref 150–400)
RBC: 3.33 MIL/uL — ABNORMAL LOW (ref 3.87–5.11)
RDW: 17.8 % — ABNORMAL HIGH (ref 11.5–15.5)
WBC: 6.8 10*3/uL (ref 4.0–10.5)
nRBC: 0 % (ref 0.0–0.2)

## 2020-05-28 LAB — COMPREHENSIVE METABOLIC PANEL
ALT: 20 U/L (ref 0–44)
AST: 27 U/L (ref 15–41)
Albumin: 3 g/dL — ABNORMAL LOW (ref 3.5–5.0)
Alkaline Phosphatase: 43 U/L (ref 38–126)
Anion gap: 6 (ref 5–15)
BUN: 5 mg/dL — ABNORMAL LOW (ref 6–20)
CO2: 22 mmol/L (ref 22–32)
Calcium: 8.5 mg/dL — ABNORMAL LOW (ref 8.9–10.3)
Chloride: 113 mmol/L — ABNORMAL HIGH (ref 98–111)
Creatinine, Ser: 0.93 mg/dL (ref 0.44–1.00)
GFR calc Af Amer: >60 mL/min (ref >60)
GFR calc non Af Amer: >60 mL/min (ref >60)
Glucose, Bld: 113 mg/dL — ABNORMAL HIGH (ref 70–99)
Potassium: 3.9 mmol/L (ref 3.5–5.1)
Sodium: 141 mmol/L (ref 135–145)
Total Bilirubin: 0.5 mg/dL (ref 0.3–1.2)
Total Protein: 5.7 g/dL — ABNORMAL LOW (ref 6.5–8.1)

## 2020-05-28 LAB — HEMOGLOBIN AND HEMATOCRIT, BLOOD
HCT: 24.1 % — ABNORMAL LOW (ref 36.0–46.0)
Hemoglobin: 7.5 g/dL — ABNORMAL LOW (ref 12.0–15.0)

## 2020-05-28 LAB — BRAIN NATRIURETIC PEPTIDE: B Natriuretic Peptide: 233 pg/mL — ABNORMAL HIGH (ref 0.0–100.0)

## 2020-05-28 LAB — MAGNESIUM: Magnesium: 2 mg/dL (ref 1.7–2.4)

## 2020-05-28 LAB — C-REACTIVE PROTEIN: CRP: 0.6 mg/dL (ref <1.0)

## 2020-05-28 MED ORDER — PANTOPRAZOLE SODIUM 40 MG PO TBEC
40.0000 mg | DELAYED_RELEASE_TABLET | Freq: Every day | ORAL | Status: DC
  Administered 2020-05-28: 40 mg via ORAL
  Filled 2020-05-28: qty 1

## 2020-05-28 MED ORDER — BISACODYL 5 MG PO TBEC
20.0000 mg | DELAYED_RELEASE_TABLET | Freq: Once | ORAL | Status: AC
  Administered 2020-05-28: 20 mg via ORAL
  Filled 2020-05-28: qty 4

## 2020-05-28 MED ORDER — PEG-KCL-NACL-NASULF-NA ASC-C 100 G PO SOLR
1.0000 | Freq: Once | ORAL | Status: AC
  Administered 2020-05-28: 200 g via ORAL
  Filled 2020-05-28: qty 1

## 2020-05-28 MED ORDER — METOCLOPRAMIDE HCL 5 MG/ML IJ SOLN
10.0000 mg | Freq: Once | INTRAMUSCULAR | Status: AC
  Administered 2020-05-28: 10 mg via INTRAVENOUS
  Filled 2020-05-28: qty 2

## 2020-05-28 NOTE — Progress Notes (Signed)
PROGRESS NOTE                                                                                                                                                                                                             Patient Demographics:    Taylor Black, is a 49 y.o. female, DOB - October 16, 1971, [id number]  Admit date - 05/26/2020   Admitting Physician Christel Mormon, MD  Outpatient Primary MD for the patient is [name], Malka So, MD  LOS - 2  Chief Complaint  Patient presents with  . Chest Pain       Brief Narrative  - Taylor Black  is a 49 y.o. female with a known history of coronary artery disease, anxiety/depression and GERD, presented to the emergency room because of palpitations and fatigue as well as dyspnea on exertion with occasional chest pain with exertion and recent bright red bleeding per rectum off-and-on ongoing for 3 days, in the ER was found to be severely anemic hypotensive and admitted to the hospital.   Subjective:   Patient in bed, appears comfortable, denies any headache, no fever, no chest pain or pressure, no shortness of breath , no abdominal pain. No focal weakness.    Assessment  & Plan :     1. Symptomatic anemia due to GI bleed. BUN is stable and no upper GI symptoms, she is having bright red blood per rectum, could most likely be lower GI bleed. She s/p 2 units packed RBC transfusion on 05/27/2020 with stable H&H.  CT scan abdomen pelvis shows possible diverticulosis, tagged RBC scan negative.  Currently on soft diet and stable.  Continue PPI and monitor CBC.  2. Nonocclusive CAD. Symptom-free continue beta-blocker at reduced dose along with statin..  3. Essential hypertension. Blood pressure soft hold blood pressure medications except beta-blocker at reduced dose.  4. Morbid obesity BMI 39. Follow with PCP for weight loss.  5.  Asymptomatic hepatic angioma.  Outpatient monitoring by PCP as desired.  May require outpatient  MRI.  6.  Large uterine fibroid.  Outpatient OB follow-up.    Condition - Extremely Guarded  Family Communication  :  None  Code Status :  Full  Consults  :  GI  Procedures  :    Tagged RBC scan.  Negative for acute bleed.  CT abdomen pelvis -  1. Rectosigmoid diverticulosis without acute inflammation. 2. Fibroid uterus. This likely explains the findings on the patient's recent GI bleeding study. 3. Trace right-sided pleural  effusion. 4. Probable hepatic hemangioma in the right hepatic lobe. As this was not fully assessed on this examination, an outpatient contrast enhanced multiphase MRI is recommended for further evaluation. Aortic Atherosclerosis (ICD10-I70.0).   PUD Prophylaxis : PPI  Disposition Plan  :    Status is: Inpatient  Remains inpatient appropriate because:IV treatments appropriate due to intensity of illness or inability to take PO   Dispo: The patient is from: Home              Anticipated d/c is to: Home              Anticipated d/c date is: > 3 days              Patient currently is not medically stable to d/c.  DVT Prophylaxis  :  SCDs    Lab Results  Component Value Date   PLT 401 (H) 05/28/2020    Diet :  Diet Order            Diet clear liquid Room service appropriate? Yes; Fluid consistency: Thin  Diet effective now                  Inpatient Medications Scheduled Meds: . atorvastatin  20 mg Oral Q supper  . carvedilol  3.125 mg Oral BID WC  . pantoprazole  40 mg Oral Daily   Continuous Infusions:  PRN Meds:.[DISCONTINUED] acetaminophen **OR** acetaminophen, acetaminophen, hydrALAZINE, [DISCONTINUED] ondansetron **OR** ondansetron (ZOFRAN) IV, traZODone  Antibiotics  :   Anti-infectives (From admission, onward)   None          Objective:   Vitals:   05/27/20 1200 05/27/20 1623 05/27/20 2038 05/28/20 0745  BP: 110/65 118/78 117/66 (!) 141/73  Pulse: 67 61 59 63  Resp: 18 18 16    Temp: 98.2 F (36.8 C) 98.1 F (36.7 C)  98 F (36.7 C) 98.2 F (36.8 C)  TempSrc: Oral Oral    SpO2: 100% 98% 100% 100%  Weight:      Height:        SpO2: 100 %  Wt Readings from Last 3 Encounters:  05/26/20 90.7 kg  12/14/19 90.7 kg  08/29/19 96.2 kg     Intake/Output Summary (Last 24 hours) at 05/28/2020 1107 Last data filed at 05/28/2020 0400 Gross per 24 hour  Intake 1821.25 ml  Output --  Net 1821.25 ml     Physical Exam  Awake Alert, No new F.N deficits, Normal affect [place].AT,PERRAL Supple Neck,No JVD, No cervical lymphadenopathy appriciated.  Symmetrical Chest wall movement, Good air movement bilaterally, CTAB RRR,No Gallops, Rubs or new Murmurs, No Parasternal Heave +ve B.Sounds, Abd Soft, No tenderness, No organomegaly appriciated, No rebound - guarding or rigidity. No Cyanosis, Clubbing or edema, No new Rash or bruise    Data Review:    Recent Labs  Lab 05/26/20 1520 05/26/20 2031 05/27/20 0656 05/27/20 1152 05/27/20 1828 05/28/20 0036 05/28/20 0733  WBC 8.4  --  6.6  --   --   --  6.8  HGB 5.3*   < > 7.5* 7.2* 8.0* 7.5* 8.2*  HCT 18.6*   < > 23.8* 22.6* 25.4* 24.1* 26.4*  PLT 352  --  320  --   --   --  401*  MCV 78.8*  --  79.1*  --   --   --  79.3*  MCH 22.5*  --  24.9*  --   --   --  24.6*  MCHC 28.5*  --  31.5  --   --   --  31.1  RDW 18.1*  --  17.9*  --   --   --  17.8*  LYMPHSABS  --   --   --   --   --   --  1.1  MONOABS  --   --   --   --   --   --  0.6  EOSABS  --   --   --   --   --   --  0.2  BASOSABS  --   --   --   --   --   --  0.0   < > = values in this interval not displayed.    Recent Labs  Lab 05/26/20 1520 05/27/20 0656 05/28/20 0036 05/28/20 0733  NA 140 142  --  141  K 4.0 3.9  --  3.9  CL 112* 113*  --  113*  CO2 21* 22  --  22  GLUCOSE 117* 108*  --  113*  BUN 17 8  --  5*  CREATININE 0.86 0.82  --  0.93  CALCIUM 8.5* 8.0*  --  8.5*  AST  --   --   --  27  ALT  --   --   --  20  ALKPHOS  --   --   --  43  BILITOT  --   --   --  0.5  ALBUMIN   --   --   --  3.0*  MG  --   --   --  2.0  CRP  --   --   --  0.6  BNP  --   --  233.0*  --     Recent Labs  Lab 05/26/20 1748 05/28/20 0036 05/28/20 0733  CRP  --   --  0.6  BNP  --  233.0*  --   SARSCOV2NAA NEGATIVE  --   --     ------------------------------------------------------------------------------------------------------------------ No results for input(s): CHOL, HDL, LDLCALC, TRIG, CHOLHDL, LDLDIRECT in the last 72 hours.  Lab Results  Component Value Date   HGBA1C 5.9 (H) 06/13/2019   ------------------------------------------------------------------------------------------------------------------ No results for input(s): TSH, T4TOTAL, T3FREE, THYROIDAB in the last 72 hours.  Invalid input(s): FREET3 ------------------------------------------------------------------------------------------------------------------ No results for input(s): VITAMINB12, FOLATE, FERRITIN, TIBC, IRON, RETICCTPCT in the last 72 hours.  Coagulation profile No results for input(s): INR, PROTIME in the last 168 hours.  No results for input(s): DDIMER in the last 72 hours.  Cardiac Enzymes No results for input(s): CKMB, TROPONINI, MYOGLOBIN in the last 168 hours.  Invalid input(s): CK ------------------------------------------------------------------------------------------------------------------    Component Value Date/Time   BNP 233.0 (H) 05/28/2020 0036    Micro Results Recent Results (from the past 240 hour(s))  SARS Coronavirus 2 by RT PCR (hospital order, performed in [facility] hospital lab) Nasopharyngeal Nasopharyngeal Swab     Status: None   Collection Time: 05/26/20  5:48 PM   Specimen: Nasopharyngeal Swab  Result Value Ref Range Status   SARS Coronavirus 2 NEGATIVE NEGATIVE Final    Comment: (NOTE) SARS-CoV-2 target nucleic acids are NOT DETECTED.  The SARS-CoV-2 RNA is generally detectable in upper and lower respiratory specimens during the acute phase of  infection. The lowest concentration of SARS-CoV-2 viral copies this assay can detect is 250 copies / mL. A negative result does not preclude SARS-CoV-2 infection and should not be used as the sole basis for treatment or other patient management decisions.  A negative result may occur with improper specimen collection / handling, submission of specimen other than nasopharyngeal swab, presence of viral mutation(s) within the areas targeted by this assay, and inadequate number of viral copies (<250 copies / mL). A negative result must be combined with clinical observations, patient history, and epidemiological information.  Fact Sheet for Patients:   StrictlyIdeas.no  Fact Sheet for Healthcare Providers: BankingDealers.co.za  This test is not yet approved or  cleared by the [place] FDA and has been authorized for detection and/or diagnosis of SARS-CoV-2 by FDA under an Emergency Use Authorization (EUA).  This EUA will remain in effect (meaning this test can be used) for the duration of the COVID-19 declaration under Section 564(b)(1) of the Act, 21 U.S.C. section 360bbb-3(b)(1), unless the authorization is terminated or revoked sooner.  Performed at [facility], [place] [address], [place], [place] [address]     Radiology Reports DG Chest 2 View  Result Date: 05/26/2020 CLINICAL DATA:  Chest pain with palpitations and shortness of breath on exertion EXAM: CHEST - 2 VIEW COMPARISON:  Radiograph 06/12/2019 FINDINGS: No consolidation, features of edema, pneumothorax, or effusion. Pulmonary vascularity is normally distributed. The cardiomediastinal contours are unremarkable. No acute osseous or soft tissue abnormality. Degenerative changes are present in the imaged spine and shoulders. IMPRESSION: No acute cardiopulmonary abnormality. Electronically Signed   By: Lovena Le M.D.   On: 05/26/2020 16:07   NM GI Blood Loss  Result  Date: 05/27/2020 CLINICAL DATA:  Intermittent bright red blood per rectum off and on for 3 days, anemia, hypotensive at presentation EXAM: [place]: Sequential abdominal images were obtained following intravenous administration of Tc-68m labeled red blood cells. RADIOPHARMACEUTICALS:  24.5 mCi Tc-39m pertechnetate in-vitro labeled red cells. COMPARISON:  None FINDINGS: Normal blood pool distribution of tracer. No abnormal gastrointestinal localization of tracer identified to suggest active GI bleeding. Large rounded area of persistent increased tracer background is identified projecting over the sacrum and inferior to the aortic bifurcation, above the urinary bladder, atypical. This suggest presence of increased blood pool within this region, question enlarged uterus or hypervascular mass. IMPRESSION: No scintigraphic evidence of active GI bleeding. Abnormal increased pool of labeled red cells projecting over the aortic bifurcation, sacrum, and supra vesicular region throughout the exam, could be related to an enlarged uterus or a hypervascular mass; recommend CT imaging to evaluate. Electronically Signed   By: Lavonia Dana M.D.   On: 05/27/2020 16:23   CT ABDOMEN PELVIS W CONTRAST  Result Date: 05/27/2020 CLINICAL DATA:  GI bleed. EXAM: CT ABDOMEN AND PELVIS WITH CONTRAST TECHNIQUE: Multidetector CT imaging of the abdomen and pelvis was performed using the standard protocol following bolus administration of intravenous contrast. CONTRAST:  184mL OMNIPAQUE IOHEXOL 300 MG/ML  SOLN COMPARISON:  GI bleeding study earlier in the same day. FINDINGS: Lower chest: There is a trace right-sided pleural effusion.The heart size is normal. Hepatobiliary: There is a hypoattenuating 2.8 cm mass in the right hepatic lobe that appears to fill in on the delayed phase and as such is favored to represent a hepatic hemangioma. However, the lesion is not fully visualized on the delayed  phase and therefore this lesion is incompletely characterized normal gallbladder.There is no biliary ductal dilation. Pancreas: Normal contours without ductal dilatation. No peripancreatic fluid collection. Spleen: Unremarkable. Adrenals/Urinary Tract: --Adrenal glands: Unremarkable. --Right kidney/ureter: No hydronephrosis or radiopaque kidney stones. --Left kidney/ureter: No hydronephrosis or radiopaque kidney stones. --Urinary bladder: Unremarkable. Stomach/Bowel: --Stomach/Duodenum: No  hiatal hernia or other gastric abnormality. Normal duodenal course and caliber. --Small bowel: Unremarkable. --Colon: Rectosigmoid diverticulosis without acute inflammation. --Appendix: Normal. Vascular/Lymphatic: Atherosclerotic calcification is present within the non-aneurysmal abdominal aorta, without hemodynamically significant stenosis. --No retroperitoneal lymphadenopathy. --No mesenteric lymphadenopathy. --No pelvic or inguinal lymphadenopathy. Reproductive: The uterus is enlarged with multiple fibroids. Other: No ascites or free air. The abdominal wall is normal. Musculoskeletal. No acute displaced fractures. IMPRESSION: 1. Rectosigmoid diverticulosis without acute inflammation. 2. Fibroid uterus. This likely explains the findings on the patient's recent GI bleeding study. 3. Trace right-sided pleural effusion. 4. Probable hepatic hemangioma in the right hepatic lobe. As this was not fully assessed on this examination, an outpatient contrast enhanced multiphase MRI is recommended for further evaluation. Aortic Atherosclerosis (ICD10-I70.0). Electronically Signed   By: Constance Holster M.D.   On: 05/27/2020 22:16    Time Spent in minutes  30   Lala Lund M.D on 05/28/2020 at 11:07 AM  To page go to [email or web address] - password [facility]

## 2020-05-28 NOTE — Progress Notes (Signed)
Daily Rounding Note  05/28/2020, 1:04 PM  LOS: 2 days   SUBJECTIVE:   Chief complaint:  [place] bloody stools.  anemia   BPs better but still a bit soft.  No tachycardia.  Very small pinkish colored stool this AM.  Feels well.  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  Looks well.  Extra pleasant.     Heart: RRR Chest: clear bil.  No dyspnea or cough Abdomen: soft, NT, active BS,  ND  Extremities: no CCE Neuro/Psych:  Fully alert, oriented.  No weakness or tremors.  Fluid speech.  Cooperative.    Intake/Output from previous day: 07/25 0701 - 07/26 0700 In: 1821.3 [P.O.:120; I.V.:1701.3] Out: -   Intake/Output this shift: No intake/output data recorded.  Lab Results: Recent Labs    05/26/20 1520 05/26/20 2031 05/27/20 0656 05/27/20 1152 05/27/20 1828 05/28/20 0036 05/28/20 0733  WBC 8.4  --  6.6  --   --   --  6.8  HGB 5.3*   < > 7.5*   < > 8.0* 7.5* 8.2*  HCT 18.6*   < > 23.8*   < > 25.4* 24.1* 26.4*  PLT 352  --  320  --   --   --  401*   < > = values in this interval not displayed.   BMET Recent Labs    05/26/20 1520 05/27/20 0656 05/28/20 0733  NA 140 142 141  K 4.0 3.9 3.9  CL 112* 113* 113*  CO2 21* 22 22  GLUCOSE 117* 108* 113*  BUN 17 8 5*  CREATININE 0.86 0.82 0.93  CALCIUM 8.5* 8.0* 8.5*   LFT Recent Labs    05/28/20 0733  PROT 5.7*  ALBUMIN 3.0*  AST 27  ALT 20  ALKPHOS 43  BILITOT 0.5   PT/INR No results for input(s): LABPROT, INR in the last 72  hours. Hepatitis Panel No results for input(s): HEPBSAG, HCVAB, HEPAIGM, HEPBIGM in the last 72 hours.  Studies/Results: DG Chest 2 View  Result Date: 05/26/2020 CLINICAL DATA:  Chest pain with palpitations and shortness of breath on exertion EXAM: CHEST - 2 VIEW COMPARISON:  Radiograph 06/12/2019 FINDINGS: No consolidation, features of edema, pneumothorax, or effusion. Pulmonary vascularity is normally distributed. The cardiomediastinal contours are unremarkable. No acute osseous or soft tissue abnormality. Degenerative changes are present in the imaged spine and shoulders. IMPRESSION: No acute cardiopulmonary abnormality. Electronically Signed   By: Lovena Le M.D.   On: 05/26/2020 16:07   NM GI Blood Loss  Result Date: 05/27/2020 CLINICAL DATA:  Intermittent bright red blood per rectum off and on for 3 days, anemia, hypotensive at presentation EXAM: [place]: Sequential abdominal images were obtained following intravenous administration of Tc-24m labeled red blood cells. RADIOPHARMACEUTICALS:  24.5 mCi Tc-57m pertechnetate in-vitro labeled red cells. COMPARISON:  None FINDINGS: Normal blood pool distribution of tracer. No abnormal gastrointestinal localization of tracer identified to  suggest active GI bleeding. Large rounded area of persistent increased tracer background is identified projecting over the sacrum and inferior to the aortic bifurcation, above the urinary bladder, atypical. This suggest presence of increased blood pool within this region, question enlarged uterus or hypervascular mass. IMPRESSION: No scintigraphic evidence of active GI bleeding. Abnormal increased pool of labeled red cells projecting over the aortic bifurcation, sacrum, and supra vesicular region throughout the exam, could be related to an enlarged uterus or a hypervascular mass; recommend CT imaging to evaluate. Electronically Signed   By: Lavonia Dana M.D.   On:  05/27/2020 16:23   CT ABDOMEN PELVIS W CONTRAST  Result Date: 05/27/2020 CLINICAL DATA:  GI bleed. EXAM: CT ABDOMEN AND PELVIS WITH CONTRAST TECHNIQUE: Multidetector CT imaging of the abdomen and pelvis was performed using the standard protocol following bolus administration of intravenous contrast. CONTRAST:  139mL OMNIPAQUE IOHEXOL 300 MG/ML  SOLN COMPARISON:  GI bleeding study earlier in the same day. FINDINGS: Lower chest: There is a trace right-sided pleural effusion.The heart size is normal. Hepatobiliary: There is a hypoattenuating 2.8 cm mass in the right hepatic lobe that appears to fill in on the delayed phase and as such is favored to represent a hepatic hemangioma. However, the lesion is not fully visualized on the delayed phase and therefore this lesion is incompletely characterized normal gallbladder.There is no biliary ductal dilation. Pancreas: Normal contours without ductal dilatation. No peripancreatic fluid collection. Spleen: Unremarkable. Adrenals/Urinary Tract: --Adrenal glands: Unremarkable. --Right kidney/ureter: No hydronephrosis or radiopaque kidney stones. --Left kidney/ureter: No hydronephrosis or radiopaque kidney stones. --Urinary bladder: Unremarkable. Stomach/Bowel: --Stomach/Duodenum: No hiatal hernia or other gastric abnormality. Normal duodenal course and caliber. --Small bowel: Unremarkable. --Colon: Rectosigmoid diverticulosis without acute inflammation. --Appendix: Normal. Vascular/Lymphatic: Atherosclerotic calcification is present within the non-aneurysmal abdominal aorta, without hemodynamically significant stenosis. --No retroperitoneal lymphadenopathy. --No mesenteric lymphadenopathy. --No pelvic or inguinal lymphadenopathy. Reproductive: The uterus is enlarged with multiple fibroids. Other: No ascites or free air. The abdominal wall is normal. Musculoskeletal. No acute displaced fractures. IMPRESSION: 1. Rectosigmoid diverticulosis without acute inflammation. 2.  Fibroid uterus. This likely explains the findings on the patient's recent GI bleeding study. 3. Trace right-sided pleural effusion. 4. Probable hepatic hemangioma in the right hepatic lobe. As this was not fully assessed on this examination, an outpatient contrast enhanced multiphase MRI is recommended for further evaluation. Aortic Atherosclerosis (ICD10-I70.0). Electronically Signed   By: Constance Holster M.D.   On: 05/27/2020 22:16    ASSESMENT:   *  Hematochezia x several days.  Passing dark red stools.   Prolosec 40 mg/daily, 81 ASA daily and BC powders prn PTA.   Bleeding scan negative for active GI bleeding but does show abnormal increased pool of red blood cells projecting over the aortic bifurcation sacrum and supravesicular region, ? related to hypervascular mass, enlarged uterus etc. recommended CT CTAP: recto-sigmoid diverticulosis.  uterine fibroids, likely explains the findings on bleeding scan.  R hepatic lobe lesion not fully assessed is likely hemagioma, MRI could help clarify.    *   Anemia.  5.3 >> 2 PRBCs >> 7.5  >> 8.2  MCV 79.  BUN not elevated.     PLAN   *   MRI to assess liver lesion  *    EGD, colonoscopy tmrw in late AM.  Prep orders in place.  Clears in place. Azucena Freed  05/28/2020, 1:04 PM Phone [phone]

## 2020-05-28 NOTE — H&P (View-Only) (Signed)
Daily Rounding Note  05/28/2020, 1:04 PM  LOS: 2 days   SUBJECTIVE:   Chief complaint:  [place] bloody stools.  anemia   BPs better but still a bit soft.  No tachycardia.  Very small pinkish colored stool this AM.  Feels well.  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  Looks well.  Extra pleasant.     Heart: RRR Chest: clear bil.  No dyspnea or cough Abdomen: soft, NT, active BS,  ND  Extremities: no CCE Neuro/Psych:  Fully alert, oriented.  No weakness or tremors.  Fluid speech.  Cooperative.    Intake/Output from previous day: 07/25 0701 - 07/26 0700 In: 1821.3 [P.O.:120; I.V.:1701.3] Out: -   Intake/Output this shift: No intake/output data recorded.  Lab Results: Recent Labs    05/26/20 1520 05/26/20 2031 05/27/20 0656 05/27/20 1152 05/27/20 1828 05/28/20 0036 05/28/20 0733  WBC 8.4  --  6.6  --   --   --  6.8  HGB 5.3*   < > 7.5*   < > 8.0* 7.5* 8.2*  HCT 18.6*   < > 23.8*   < > 25.4* 24.1* 26.4*  PLT 352  --  320  --   --   --  401*   < > = values in this interval not displayed.   BMET Recent Labs    05/26/20 1520 05/27/20 0656 05/28/20 0733  NA 140 142 141  K 4.0 3.9 3.9  CL 112* 113* 113*  CO2 21* 22 22  GLUCOSE 117* 108* 113*  BUN 17 8 5*  CREATININE 0.86 0.82 0.93  CALCIUM 8.5* 8.0* 8.5*   LFT Recent Labs    05/28/20 0733  PROT 5.7*  ALBUMIN 3.0*  AST 27  ALT 20  ALKPHOS 43  BILITOT 0.5   PT/INR No results for input(s): LABPROT, INR in the last 72  hours. Hepatitis Panel No results for input(s): HEPBSAG, HCVAB, HEPAIGM, HEPBIGM in the last 72 hours.  Studies/Results: DG Chest 2 View  Result Date: 05/26/2020 CLINICAL DATA:  Chest pain with palpitations and shortness of breath on exertion EXAM: CHEST - 2 VIEW COMPARISON:  Radiograph 06/12/2019 FINDINGS: No consolidation, features of edema, pneumothorax, or effusion. Pulmonary vascularity is normally distributed. The cardiomediastinal contours are unremarkable. No acute osseous or soft tissue abnormality. Degenerative changes are present in the imaged spine and shoulders. IMPRESSION: No acute cardiopulmonary abnormality. Electronically Signed   By: Lovena Le M.D.   On: 05/26/2020 16:07   NM GI Blood Loss  Result Date: 05/27/2020 CLINICAL DATA:  Intermittent bright red blood per rectum off and on for 3 days, anemia, hypotensive at presentation EXAM: [place]: Sequential abdominal images were obtained following intravenous administration of Tc-79m labeled red blood cells. RADIOPHARMACEUTICALS:  24.5 mCi Tc-27m pertechnetate in-vitro labeled red cells. COMPARISON:  None FINDINGS: Normal blood pool distribution of tracer. No abnormal gastrointestinal localization of tracer identified to  suggest active GI bleeding. Large rounded area of persistent increased tracer background is identified projecting over the sacrum and inferior to the aortic bifurcation, above the urinary bladder, atypical. This suggest presence of increased blood pool within this region, question enlarged uterus or hypervascular mass. IMPRESSION: No scintigraphic evidence of active GI bleeding. Abnormal increased pool of labeled red cells projecting over the aortic bifurcation, sacrum, and supra vesicular region throughout the exam, could be related to an enlarged uterus or a hypervascular mass; recommend CT imaging to evaluate. Electronically Signed   By: Lavonia Dana M.D.   On:  05/27/2020 16:23   CT ABDOMEN PELVIS W CONTRAST  Result Date: 05/27/2020 CLINICAL DATA:  GI bleed. EXAM: CT ABDOMEN AND PELVIS WITH CONTRAST TECHNIQUE: Multidetector CT imaging of the abdomen and pelvis was performed using the standard protocol following bolus administration of intravenous contrast. CONTRAST:  156mL OMNIPAQUE IOHEXOL 300 MG/ML  SOLN COMPARISON:  GI bleeding study earlier in the same day. FINDINGS: Lower chest: There is a trace right-sided pleural effusion.The heart size is normal. Hepatobiliary: There is a hypoattenuating 2.8 cm mass in the right hepatic lobe that appears to fill in on the delayed phase and as such is favored to represent a hepatic hemangioma. However, the lesion is not fully visualized on the delayed phase and therefore this lesion is incompletely characterized normal gallbladder.There is no biliary ductal dilation. Pancreas: Normal contours without ductal dilatation. No peripancreatic fluid collection. Spleen: Unremarkable. Adrenals/Urinary Tract: --Adrenal glands: Unremarkable. --Right kidney/ureter: No hydronephrosis or radiopaque kidney stones. --Left kidney/ureter: No hydronephrosis or radiopaque kidney stones. --Urinary bladder: Unremarkable. Stomach/Bowel: --Stomach/Duodenum: No hiatal hernia or other gastric abnormality. Normal duodenal course and caliber. --Small bowel: Unremarkable. --Colon: Rectosigmoid diverticulosis without acute inflammation. --Appendix: Normal. Vascular/Lymphatic: Atherosclerotic calcification is present within the non-aneurysmal abdominal aorta, without hemodynamically significant stenosis. --No retroperitoneal lymphadenopathy. --No mesenteric lymphadenopathy. --No pelvic or inguinal lymphadenopathy. Reproductive: The uterus is enlarged with multiple fibroids. Other: No ascites or free air. The abdominal wall is normal. Musculoskeletal. No acute displaced fractures. IMPRESSION: 1. Rectosigmoid diverticulosis without acute inflammation. 2.  Fibroid uterus. This likely explains the findings on the patient's recent GI bleeding study. 3. Trace right-sided pleural effusion. 4. Probable hepatic hemangioma in the right hepatic lobe. As this was not fully assessed on this examination, an outpatient contrast enhanced multiphase MRI is recommended for further evaluation. Aortic Atherosclerosis (ICD10-I70.0). Electronically Signed   By: Constance Holster M.D.   On: 05/27/2020 22:16    ASSESMENT:   *  Hematochezia x several days.  Passing dark red stools.   Prolosec 40 mg/daily, 81 ASA daily and BC powders prn PTA.   Bleeding scan negative for active GI bleeding but does show abnormal increased pool of red blood cells projecting over the aortic bifurcation sacrum and supravesicular region, ? related to hypervascular mass, enlarged uterus etc. recommended CT CTAP: recto-sigmoid diverticulosis.  uterine fibroids, likely explains the findings on bleeding scan.  R hepatic lobe lesion not fully assessed is likely hemagioma, MRI could help clarify.    *   Anemia.  5.3 >> 2 PRBCs >> 7.5  >> 8.2  MCV 79.  BUN not elevated.     PLAN   *   MRI to assess liver lesion  *    EGD, colonoscopy tmrw in late AM.  Prep orders in place.  Clears in place. Azucena Freed  05/28/2020, 1:04 PM Phone [phone]

## 2020-05-28 NOTE — Evaluation (Signed)
Physical Therapy Evaluation Patient Details Name: Taylor Black MRN: [id number] DOB: 03/03/1971 Today's Date: 05/28/2020   History of Present Illness  Taylor Black  is a 49 y.o. female with a known history of coronary artery disease, anxiety/depression and GERD, presented to the emergency room because of palpitations and fatigue as well as dyspnea on exertion with occasional chest pain with exertion and recent bright red bleeding per rectum off-and-on ongoing for 3 days, in the ER was found to be severely anemic hypotensive and admitted to the hospital.  Clinical Impression   Patient received sitting at EOB, reports she is doing well and willing to work with PT. See below for mobility/assist levels- able to mobilize at a distant S to independent level, just weak and mildly deconditioned. Had some light headedness in hallway with walking but dynamap unavailable, light headedness did resolve sitting at EOB after ambulation. She reports she is mostly at her baseline level of function. Would benefit from one follow-up session for stair training and HEP if she is still here later this week. Left sitting at EOB with all needs met this morning.     Follow Up Recommendations No PT follow up    Equipment Recommendations  None recommended by PT    Recommendations for Other Services       Precautions / Restrictions Precautions Precautions: Other (comment) Precaution Comments: watch BP Restrictions Weight Bearing Restrictions: No      Mobility  Bed Mobility Overal bed mobility: Independent                Transfers Overall transfer level: Independent Equipment used: None                Ambulation/Gait Ambulation/Gait assistance: Independent Gait Distance (Feet): 300 Feet Assistive device: None Gait Pattern/deviations: WFL(Within Functional Limits);Step-through pattern Gait velocity: WNL   General Gait Details: gait pattern WNL, no significant issuse with  safety or unsteadiness noted  Stairs            Wheelchair Mobility    Modified Rankin (Stroke Patients Only)       Balance Overall balance assessment: Independent                                           Pertinent Vitals/Pain Pain Assessment: No/denies pain    Home Living Family/patient expects to be discharged to:: Private residence Living Arrangements: Children;Other (Comment) (grandkids are 1 and 4) Available Help at Discharge: Family;Available PRN/intermittently Type of Home: House Home Access: Stairs to enter Entrance Stairs-Rails: Left Entrance Stairs-Number of Steps: 4 Home Layout: One level Home Equipment: None Additional Comments: 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    Prior Function Level of Independence: Independent               Journalist, newspaper        Extremity/Trunk Assessment   Upper Extremity Assessment Upper Extremity Assessment: Overall WFL for tasks assessed    Lower Extremity Assessment Lower Extremity Assessment: Overall WFL for tasks assessed    Cervical / Trunk Assessment Cervical / Trunk Assessment: Normal  Communication   Communication: No difficulties  Cognition Arousal/Alertness: Awake/alert Behavior During Therapy: WFL for tasks assessed/performed Overall Cognitive Status: Within Functional Limits for tasks assessed  General Comments      Exercises     Assessment/Plan    PT Assessment Patient needs continued PT services  PT Problem List Decreased strength;Decreased activity tolerance       PT Treatment Interventions Balance training;Stair training;Gait training;Therapeutic exercise;Therapeutic activities    PT Goals (Current goals can be found in the Care Plan section)  Acute Rehab PT Goals Patient Stated Goal: go home PT Goal Formulation: With patient Time For Goal Achievement: 06/11/20 Potential to Achieve Goals: Good     Frequency Min 3X/week   Barriers to discharge        Co-evaluation               AM-PAC PT "6 Clicks" Mobility  Outcome Measure Help needed turning from your back to your side while in a flat bed without using bedrails?: None Help needed moving from lying on your back to sitting on the side of a flat bed without using bedrails?: None Help needed moving to and from a bed to a chair (including a wheelchair)?: None Help needed standing up from a chair using your arms (e.g., wheelchair or bedside chair)?: None Help needed to walk in hospital room?: A Little Help needed climbing 3-5 steps with a railing? : A Little 6 Click Score: 22    End of Session   Activity Tolerance: Patient tolerated treatment well Patient left: in bed;with call bell/phone within reach (sitting at EOB)   PT Visit Diagnosis: Muscle weakness (generalized) (M62.81)    Time: 2761-8485 PT Time Calculation (min) (ACUTE ONLY): 10 min   Charges:   PT Evaluation $PT Eval Low Complexity: 1 Low          Windell Norfolk, DPT, PN1   Supplemental Physical Therapist [place]    Pager [phone] Acute Rehab Office [phone]

## 2020-05-29 ENCOUNTER — Other Ambulatory Visit: Payer: Self-pay | Admitting: Physician Assistant

## 2020-05-29 ENCOUNTER — Inpatient Hospital Stay (HOSPITAL_COMMUNITY): Payer: 59 | Admitting: Anesthesiology

## 2020-05-29 ENCOUNTER — Encounter (HOSPITAL_COMMUNITY)
Admission: EM | Disposition: A | Discharge status: Home / Self Care | Payer: Self-pay | Source: Home / Self Care | Attending: Internal Medicine

## 2020-05-29 ENCOUNTER — Encounter (HOSPITAL_COMMUNITY): Payer: Self-pay | Admitting: Family Medicine

## 2020-05-29 DIAGNOSIS — K2990 Gastroduodenitis, unspecified, without bleeding: Secondary | ICD-10-CM

## 2020-05-29 DIAGNOSIS — K641 Second degree hemorrhoids: Secondary | ICD-10-CM

## 2020-05-29 DIAGNOSIS — K2970 Gastritis, unspecified, without bleeding: Secondary | ICD-10-CM

## 2020-05-29 DIAGNOSIS — K449 Diaphragmatic hernia without obstruction or gangrene: Secondary | ICD-10-CM

## 2020-05-29 DIAGNOSIS — K621 Rectal polyp: Secondary | ICD-10-CM

## 2020-05-29 DIAGNOSIS — K635 Polyp of colon: Secondary | ICD-10-CM

## 2020-05-29 DIAGNOSIS — K2100 Gastro-esophageal reflux disease with esophagitis, without bleeding: Secondary | ICD-10-CM

## 2020-05-29 HISTORY — PX: COLONOSCOPY WITH PROPOFOL: SHX5780

## 2020-05-29 HISTORY — PX: POLYPECTOMY: SHX5525

## 2020-05-29 HISTORY — PX: ESOPHAGOGASTRODUODENOSCOPY (EGD) WITH PROPOFOL: SHX5813

## 2020-05-29 HISTORY — PX: BIOPSY: SHX5522

## 2020-05-29 LAB — IRON AND TIBC
Iron: 16 ug/dL — ABNORMAL LOW (ref 28–170)
Saturation Ratios: 3 % — ABNORMAL LOW (ref 10.4–31.8)
TIBC: 528 ug/dL — ABNORMAL HIGH (ref 250–450)
UIBC: 512 ug/dL

## 2020-05-29 LAB — CBC WITH DIFFERENTIAL/PLATELET
Abs Immature Granulocytes: 0.02 10*3/uL (ref 0.00–0.07)
Basophils Absolute: 0 10*3/uL (ref 0.0–0.1)
Basophils Relative: 1 %
Eosinophils Absolute: 0.2 10*3/uL (ref 0.0–0.5)
Eosinophils Relative: 3 %
HCT: 28.7 % — ABNORMAL LOW (ref 36.0–46.0)
Hemoglobin: 8.9 g/dL — ABNORMAL LOW (ref 12.0–15.0)
Immature Granulocytes: 0 %
Lymphocytes Relative: 20 %
Lymphs Abs: 1.3 10*3/uL (ref 0.7–4.0)
MCH: 24.5 pg — ABNORMAL LOW (ref 26.0–34.0)
MCHC: 31 g/dL (ref 30.0–36.0)
MCV: 78.8 fL — ABNORMAL LOW (ref 80.0–100.0)
Monocytes Absolute: 0.7 10*3/uL (ref 0.1–1.0)
Monocytes Relative: 10 %
Neutro Abs: 4.5 10*3/uL (ref 1.7–7.7)
Neutrophils Relative %: 66 %
Platelets: 468 10*3/uL — ABNORMAL HIGH (ref 150–400)
RBC: 3.64 MIL/uL — ABNORMAL LOW (ref 3.87–5.11)
RDW: 17.6 % — ABNORMAL HIGH (ref 11.5–15.5)
WBC: 6.7 10*3/uL (ref 4.0–10.5)
nRBC: 0 % (ref 0.0–0.2)

## 2020-05-29 LAB — COMPREHENSIVE METABOLIC PANEL
ALT: 22 U/L (ref 0–44)
AST: 32 U/L (ref 15–41)
Albumin: 3.7 g/dL (ref 3.5–5.0)
Alkaline Phosphatase: 52 U/L (ref 38–126)
Anion gap: 7 (ref 5–15)
BUN: <5 mg/dL — ABNORMAL LOW (ref 6–20)
CO2: 22 mmol/L (ref 22–32)
Calcium: 8.9 mg/dL (ref 8.9–10.3)
Chloride: 110 mmol/L (ref 98–111)
Creatinine, Ser: 0.98 mg/dL (ref 0.44–1.00)
GFR calc Af Amer: >60 mL/min (ref >60)
GFR calc non Af Amer: >60 mL/min (ref >60)
Glucose, Bld: 121 mg/dL — ABNORMAL HIGH (ref 70–99)
Potassium: 3.3 mmol/L — ABNORMAL LOW (ref 3.5–5.1)
Sodium: 139 mmol/L (ref 135–145)
Total Bilirubin: 0.6 mg/dL (ref 0.3–1.2)
Total Protein: 6.9 g/dL (ref 6.5–8.1)

## 2020-05-29 LAB — FERRITIN: Ferritin: 5 ng/mL — ABNORMAL LOW (ref 11–307)

## 2020-05-29 LAB — VITAMIN B12: Vitamin B-12: 435 pg/mL (ref 180–914)

## 2020-05-29 LAB — C-REACTIVE PROTEIN: CRP: 0.5 mg/dL (ref <1.0)

## 2020-05-29 LAB — BRAIN NATRIURETIC PEPTIDE: B Natriuretic Peptide: 102.1 pg/mL — ABNORMAL HIGH (ref 0.0–100.0)

## 2020-05-29 LAB — FOLATE: Folate: 31.3 ng/mL (ref >5.9)

## 2020-05-29 LAB — MAGNESIUM: Magnesium: 2.3 mg/dL (ref 1.7–2.4)

## 2020-05-29 SURGERY — COLONOSCOPY WITH PROPOFOL
Anesthesia: Monitor Anesthesia Care

## 2020-05-29 MED ORDER — POTASSIUM CHLORIDE CRYS ER 20 MEQ PO TBCR
40.0000 meq | EXTENDED_RELEASE_TABLET | Freq: Once | ORAL | Status: AC
  Administered 2020-05-29: 40 meq via ORAL
  Filled 2020-05-29: qty 2

## 2020-05-29 MED ORDER — PROPOFOL 10 MG/ML IV BOLUS
INTRAVENOUS | Status: DC | PRN
  Administered 2020-05-29: 20 mg via INTRAVENOUS
  Administered 2020-05-29: 40 mg via INTRAVENOUS
  Administered 2020-05-29: 30 mg via INTRAVENOUS
  Administered 2020-05-29: 10 mg via INTRAVENOUS
  Administered 2020-05-29: 30 mg via INTRAVENOUS

## 2020-05-29 MED ORDER — PANTOPRAZOLE SODIUM 40 MG PO TBEC: 40.0000 mg | DELAYED_RELEASE_TABLET | Freq: Two times a day (BID) | ORAL | 2 refills | Status: DC

## 2020-05-29 MED ORDER — LACTATED RINGERS IV SOLN
INTRAVENOUS | Status: AC | PRN
  Administered 2020-05-29: 1000 mL via INTRAVENOUS

## 2020-05-29 MED ORDER — DOCUSATE SODIUM 100 MG PO CAPS
100.0000 mg | ORAL_CAPSULE | Freq: Every day | ORAL | 0 refills | Status: AC
Start: 2020-05-29 — End: 2020-06-28

## 2020-05-29 MED ORDER — PROPOFOL 500 MG/50ML IV EMUL
INTRAVENOUS | Status: DC | PRN
  Administered 2020-05-29: 150 ug/kg/min via INTRAVENOUS

## 2020-05-29 MED ORDER — FOLIC ACID 1 MG PO TABS
1.0000 mg | ORAL_TABLET | Freq: Every day | ORAL | 0 refills | Status: DC
Start: 2020-05-29 — End: 2023-06-27

## 2020-05-29 MED ORDER — LIDOCAINE 2% (20 MG/ML) 5 ML SYRINGE
INTRAMUSCULAR | Status: DC | PRN
  Administered 2020-05-29: 100 mg via INTRAVENOUS

## 2020-05-29 MED ORDER — ASPIRIN EC 81 MG PO TBEC: 81.0000 mg | DELAYED_RELEASE_TABLET | Freq: Every day | ORAL | 11 refills | Status: DC

## 2020-05-29 MED ORDER — FERROUS SULFATE 325 (65 FE) MG PO TABS: 325.0000 mg | ORAL_TABLET | Freq: Two times a day (BID) | ORAL | 0 refills | Status: DC

## 2020-05-29 SURGICAL SUPPLY — 25 items

## 2020-05-29 NOTE — Anesthesia Postprocedure Evaluation (Signed)
Anesthesia Post Note  Patient: Morganna Styles Gardiner  Procedure(s) Performed: COLONOSCOPY WITH PROPOFOL (N/A ) ESOPHAGOGASTRODUODENOSCOPY (EGD) WITH PROPOFOL (N/A ) POLYPECTOMY BIOPSY     Patient location during evaluation: PACU Anesthesia Type: MAC Level of consciousness: awake and alert Pain management: pain level controlled Vital Signs Assessment: post-procedure vital signs reviewed and stable Respiratory status: spontaneous breathing, nonlabored ventilation, respiratory function stable and patient connected to nasal cannula oxygen Cardiovascular status: stable and blood pressure returned to baseline Postop Assessment: no apparent nausea or vomiting Anesthetic complications: no   No complications documented.  Last Vitals:  Vitals:   05/29/20 0812 05/29/20 0935  BP: (!) 146/83 (!) 111/62  Pulse:    Resp: 19 23  Temp: 36.6 C 36.5 C  SpO2: 99% 97%    Last Pain:  Vitals:   05/29/20 0935  TempSrc: Temporal  PainSc:                  Arvin Abello

## 2020-05-29 NOTE — Discharge Summary (Signed)
Taylor Black TML:[id number] DOB: [birth year] DOA: 05/26/2020  PCP: [name], [name], MD  Admit date: 05/26/2020  Discharge date: 05/29/2020  Admitted From: Home  Disposition:  Home   Recommendations for Outpatient Follow-up:   Follow up with PCP in 1-2 weeks  PCP Please obtain BMP/CBC, 2 view CXR in 1week,  (see Discharge instructions)   PCP Please follow up on the following pending results: Kindly follow anemia panel, CBC and BMP.  Outpatient OB and GI follow-up recommended in 1 to 2 weeks.  May require outpatient uterine MRI.   Home Health: None Equipment/Devices: None  Consultations: GI Discharge Condition: Stable    CODE STATUS: Full    Diet Recommendation: Heart Healthy     Chief Complaint  Patient presents with  . Chest Pain     Brief history of present illness from the day of admission and additional interim summary    MelissaFarringtonis a49 y.o.femalewith a known history of coronary artery disease, anxiety/depression and GERD, presented to the emergency room because of palpitations and fatigue as well as dyspnea on exertion with occasional chest pain with exertion and recent bright red bleeding per rectum off-and-on ongoing for 3 days, in the ER was found to be severely anemic hypotensive and admitted to the hospital.                                                                 Hospital Course    1. Symptomatic anemia due to GI bleed. BUN is stable and no upper GI symptoms, she is having bright red blood per rectum,  most likely be lower GI bleed. She is s/p 2 units packed RBC transfusion on 05/27/2020 with stable H&H.  CT scan abdomen pelvis shows possible diverticulosis, tagged RBC scan negative.    So underwent EGD colonoscopy by [facility] which was nonacute except for mild  gastritis, will be placed on PPI, iron supplement and stool softener with follow-up with PCP and GI outpatient.  Question if this was diverticular bleed versus hemorrhoidal bleed.  2. Nonocclusive CAD. Symptom-free continue beta-blocker at reduced dose along with statin..  3. Essential hypertension. Blood pressure soft hold blood pressure medications except beta-blocker at reduced dose.  4. Morbid obesity BMI 39. Follow with PCP for weight loss.  5.  Asymptomatic hepatic angioma.  Outpatient monitoring by PCP as desired.  May require outpatient MRI.  6.  Large uterine fibroid.  Outpatient OB follow-up.      Discharge diagnosis     Active Problems:   GI bleeding   Anemia   Diverticulosis of colon without hemorrhage   Polyp of transverse colon   Polyp of sigmoid colon   Polyp of rectum   Grade II hemorrhoids   Gastritis and gastroduodenitis   Hiatal hernia    Discharge instructions  Discharge Instructions    Diet - low sodium heart healthy   Complete by: As directed    Discharge instructions   Complete by: As directed    Follow with Primary MD [name], [name], MD in 7 days, also follow with [facility] physician within a week to 2 weeks for uterine fibroid.  Also recommended follow-up with GI in 2 weeks.  Get CBC, CMP, 2 view Chest X ray -  checked next visit within 1 week by Primary MD    Activity: As tolerated with Full fall precautions use walker/cane & assistance as needed  Disposition Home    Diet: Heart Healthy      Special Instructions: If you have smoked or chewed Tobacco  in the last 2 yrs please stop smoking, stop any regular Alcohol  and or any Recreational drug use.  On your next visit with your primary care physician please Get Medicines reviewed and adjusted.  Please request your Prim.MD to go over all Hospital Tests and Procedure/Radiological results at the follow up, please get all Hospital records sent to your Prim MD by signing hospital release  before you go home.  If you experience worsening of your admission symptoms, develop shortness of breath, life threatening emergency, suicidal or homicidal thoughts you must seek medical attention immediately by calling 911 or calling your MD immediately  if symptoms less severe.  You Must read complete instructions/literature along with all the possible adverse reactions/side effects for all the Medicines you take and that have been prescribed to you. Take any new Medicines after you have completely understood and accpet all the possible adverse reactions/side effects.   Increase activity slowly   Complete by: As directed       Discharge Medications   Allergies as of 05/29/2020      Reactions   Morphine And Related Hives, Other (See Comments)   Profuse sweating      Medication List    STOP taking these medications   BC HEADACHE POWDER PO   omeprazole 40 MG capsule Commonly known as: PRILOSEC     TAKE these medications   acetaminophen 325 MG tablet Commonly known as: TYLENOL Take 2 tablets (650 mg total) by mouth every 4 (four) hours as needed for headache or mild pain.   aspirin EC 81 MG tablet Take 1 tablet (81 mg total) by mouth daily with breakfast. Swallow whole. Start taking on: June 04, 2020 What changed: These instructions start on June 04, 2020. If you are unsure what to do until then, ask your doctor or other care provider.   atorvastatin 20 MG tablet Commonly known as: LIPITOR Take 1 tablet (20 mg total) by mouth daily at 6 PM. What changed: when to take this   carvedilol 25 MG tablet Commonly known as: COREG Take 1 tablet (25 mg total) by mouth 2 (two) times daily with a meal.   docusate sodium 100 MG capsule Commonly known as: Colace Take 1 capsule (100 mg total) by mouth daily.   ferrous sulfate 325 (65 FE) MG tablet Take 1 tablet (325 mg total) by mouth 2 (two) times daily with a meal.   folic acid 1 MG tablet Commonly known as: FOLVITE Take 1  tablet (1 mg total) by mouth daily.   losartan 100 MG tablet Commonly known as: COZAAR TAKE 1 TABLET DAILY What changed: when to take this   pantoprazole 40 MG tablet Commonly known as: PROTONIX Take 1 tablet (40 mg total) by mouth 2 (two) times  daily before a meal.        Follow-up Information    [name], [name], MD. Schedule an appointment as soon as possible for a visit in 1 week(s).   Specialty: Family Medicine Why: Along with OB physician of choice within 1 to 2 weeks.  You have a large uterine fibroid.  Contact information: [place] [address] [phone]        [name], MD .   Specialty: Cardiology Contact information: [phone] [address] [phone]        Gerrit Heck V, DO Follow up in 2 week(s).   Specialty: Gastroenterology Contact information: [place] [address] [phone]               Major procedures and Radiology Reports - PLEASE review detailed and final reports thoroughly  -        DG Chest 2 View  Result Date: 05/26/2020 CLINICAL DATA:  Chest pain with palpitations and shortness of breath on exertion EXAM: CHEST - 2 VIEW COMPARISON:  Radiograph 06/12/2019 FINDINGS: No consolidation, features of edema, pneumothorax, or effusion. Pulmonary vascularity is normally distributed. The cardiomediastinal contours are unremarkable. No acute osseous or soft tissue abnormality. Degenerative changes are present in the imaged spine and shoulders. IMPRESSION: No acute cardiopulmonary abnormality. Electronically Signed   By: Lovena Le M.D.   On: 05/26/2020 16:07   NM GI Blood Loss  Result Date: 05/27/2020 CLINICAL DATA:  Intermittent bright red blood per rectum off and on for 3 days, anemia, hypotensive at presentation EXAM: [place]: Sequential abdominal images were obtained following intravenous  administration of Tc-35m labeled red blood cells. RADIOPHARMACEUTICALS:  24.5 mCi Tc-69m pertechnetate in-vitro labeled red cells. COMPARISON:  None FINDINGS: Normal blood pool distribution of tracer. No abnormal gastrointestinal localization of tracer identified to suggest active GI bleeding. Large rounded area of persistent increased tracer background is identified projecting over the sacrum and inferior to the aortic bifurcation, above the urinary bladder, atypical. This suggest presence of increased blood pool within this region, question enlarged uterus or hypervascular mass. IMPRESSION: No scintigraphic evidence of active GI bleeding. Abnormal increased pool of labeled red cells projecting over the aortic bifurcation, sacrum, and supra vesicular region throughout the exam, could be related to an enlarged uterus or a hypervascular mass; recommend CT imaging to evaluate. Electronically Signed   By: Lavonia Dana M.D.   On: 05/27/2020 16:23   CT ABDOMEN PELVIS W CONTRAST  Result Date: 05/27/2020 CLINICAL DATA:  GI bleed. EXAM: CT ABDOMEN AND PELVIS WITH CONTRAST TECHNIQUE: Multidetector CT imaging of the abdomen and pelvis was performed using the standard protocol following bolus administration of intravenous contrast. CONTRAST:  165mL OMNIPAQUE IOHEXOL 300 MG/ML  SOLN COMPARISON:  GI bleeding study earlier in the same day. FINDINGS: Lower chest: There is a trace right-sided pleural effusion.The heart size is normal. Hepatobiliary: There is a hypoattenuating 2.8 cm mass in the right hepatic lobe that appears to fill in on the delayed phase and as such is favored to represent a hepatic hemangioma. However, the lesion is not fully visualized on the delayed phase and therefore this lesion is incompletely characterized normal gallbladder.There is no biliary ductal dilation. Pancreas: Normal contours without ductal dilatation. No peripancreatic fluid collection. Spleen: Unremarkable. Adrenals/Urinary Tract:  --Adrenal glands: Unremarkable. --Right kidney/ureter: No hydronephrosis or radiopaque kidney stones. --Left kidney/ureter: No hydronephrosis or radiopaque kidney stones. --  Urinary bladder: Unremarkable. Stomach/Bowel: --Stomach/Duodenum: No hiatal hernia or other gastric abnormality. Normal duodenal course and caliber. --Small bowel: Unremarkable. --Colon: Rectosigmoid diverticulosis without acute inflammation. --Appendix: Normal. Vascular/Lymphatic: Atherosclerotic calcification is present within the non-aneurysmal abdominal aorta, without hemodynamically significant stenosis. --No retroperitoneal lymphadenopathy. --No mesenteric lymphadenopathy. --No pelvic or inguinal lymphadenopathy. Reproductive: The uterus is enlarged with multiple fibroids. Other: No ascites or free air. The abdominal wall is normal. Musculoskeletal. No acute displaced fractures. IMPRESSION: 1. Rectosigmoid diverticulosis without acute inflammation. 2. Fibroid uterus. This likely explains the findings on the patient's recent GI bleeding study. 3. Trace right-sided pleural effusion. 4. Probable hepatic hemangioma in the right hepatic lobe. As this was not fully assessed on this examination, an outpatient contrast enhanced multiphase MRI is recommended for further evaluation. Aortic Atherosclerosis (ICD10-I70.0). Electronically Signed   By: Constance Holster M.D.   On: 05/27/2020 22:16    Micro Results     Recent Results (from the past 240 hour(s))  SARS Coronavirus 2 by RT PCR (hospital order, performed in [facility] hospital lab) Nasopharyngeal Nasopharyngeal Swab     Status: None   Collection Time: 05/26/20  5:48 PM   Specimen: Nasopharyngeal Swab  Result Value Ref Range Status   SARS Coronavirus 2 NEGATIVE NEGATIVE Final    Comment: (NOTE) SARS-CoV-2 target nucleic acids are NOT DETECTED.  The SARS-CoV-2 RNA is generally detectable in upper and lower respiratory specimens during the acute phase of infection. The  lowest concentration of SARS-CoV-2 viral copies this assay can detect is 250 copies / mL. A negative result does not preclude SARS-CoV-2 infection and should not be used as the sole basis for treatment or other patient management decisions.  A negative result may occur with improper specimen collection / handling, submission of specimen other than nasopharyngeal swab, presence of viral mutation(s) within the areas targeted by this assay, and inadequate number of viral copies (<250 copies / mL). A negative result must be combined with clinical observations, patient history, and epidemiological information.  Fact Sheet for Patients:   StrictlyIdeas.no  Fact Sheet for Healthcare Providers: BankingDealers.co.za  This test is not yet approved or  cleared by the [place] FDA and has been authorized for detection and/or diagnosis of SARS-CoV-2 by FDA under an Emergency Use Authorization (EUA).  This EUA will remain in effect (meaning this test can be used) for the duration of the COVID-19 declaration under Section 564(b)(1) of the Act, 21 U.S.C. section 360bbb-3(b)(1), unless the authorization is terminated or revoked sooner.  Performed at [facility], [place] [address], [place], [place] [address]     Today   Subjective    Relda Agosto today has no headache,no chest abdominal pain,no new weakness tingling or numbness, feels much better wants to go home today.     Objective   Blood pressure (!) 145/76, pulse 52, temperature 97.7 F (36.5 C), resp. rate 17, height 5' (1.524 m), weight 90.7 kg, last menstrual period 05/27/2020, SpO2 100 %.   Intake/Output Summary (Last 24 hours) at 05/29/2020 1031 Last data filed at 05/29/2020 6269 Gross per 24 hour  Intake 215.96 ml  Output --  Net 215.96 ml    Exam  Awake Alert, No new F.N deficits, Normal affect [place].AT,PERRAL Supple Neck,No JVD, No cervical lymphadenopathy  appriciated.  Symmetrical Chest wall movement, Good air movement bilaterally, CTAB RRR,No Gallops,Rubs or new Murmurs, No Parasternal Heave +ve B.Sounds, Abd Soft, Non tender, No organomegaly appriciated, No rebound -guarding or rigidity. No Cyanosis, Clubbing or  edema, No new Rash or bruise   Data Review   CBC w Diff:  Lab Results  Component Value Date   WBC 6.7 05/29/2020   HGB 8.9 (L) 05/29/2020   HCT 28.7 (L) 05/29/2020   PLT 468 (H) 05/29/2020   LYMPHOPCT 20 05/29/2020   MONOPCT 10 05/29/2020   EOSPCT 3 05/29/2020   BASOPCT 1 05/29/2020    CMP:  Lab Results  Component Value Date   NA 139 05/29/2020   K 3.3 (L) 05/29/2020   CL 110 05/29/2020   CO2 22 05/29/2020   BUN <5 (L) 05/29/2020   CREATININE 0.98 05/29/2020   PROT 6.9 05/29/2020   ALBUMIN 3.7 05/29/2020   BILITOT 0.6 05/29/2020   ALKPHOS 52 05/29/2020   AST 32 05/29/2020   ALT 22 05/29/2020  .   Total Time in preparing paper work, data evaluation and todays exam - 31 minutes  Lala Lund M.D on 05/29/2020 at 10:31 AM  [facility]   Office  [phone]

## 2020-05-29 NOTE — Discharge Instructions (Signed)
Follow with Primary MD [name], [name], MD in 7 days, also follow with [facility] physician within a week to 2 weeks for uterine fibroid.  Also recommended follow-up with GI in 2 weeks.  Get CBC, CMP, 2 view Chest X ray -  checked next visit within 1 week by Primary MD    Activity: As tolerated with Full fall precautions use walker/cane & assistance as needed  Disposition Home    Diet: Heart Healthy      Special Instructions: If you have smoked or chewed Tobacco  in the last 2 yrs please stop smoking, stop any regular Alcohol  and or any Recreational drug use.  On your next visit with your primary care physician please Get Medicines reviewed and adjusted.  Please request your Prim.MD to go over all Hospital Tests and Procedure/Radiological results at the follow up, please get all Hospital records sent to your Prim MD by signing hospital release before you go home.  If you experience worsening of your admission symptoms, develop shortness of breath, life threatening emergency, suicidal or homicidal thoughts you must seek medical attention immediately by calling 911 or calling your MD immediately  if symptoms less severe.  You Must read complete instructions/literature along with all the possible adverse reactions/side effects for all the Medicines you take and that have been prescribed to you. Take any new Medicines after you have completely understood and accpet all the possible adverse reactions/side effects.

## 2020-05-29 NOTE — Op Note (Signed)
[facility] Patient Name: Taylor Black Procedure Date : 05/29/2020 MRN: [id number] Attending MD: Gerrit Heck , MD Date of Birth: 06/14/1971 CSN: [id number] Age: 49 Admit Type: Inpatient Procedure:                Colonoscopy Indications:              Hematochezia, Acute post hemorrhagic anemia Providers:                Gerrit Heck, MD, Clyde Lundborg, RN, Theodora Blow, Technician Referring MD:              Medicines:                Monitored Anesthesia Care Complications:            No immediate complications. Estimated Blood Loss:     Estimated blood loss was minimal. Procedure:                Pre-Anesthesia Assessment:                           - Prior to the procedure, a History and Physical                            was performed, and patient medications and                            allergies were reviewed. The patient's tolerance of                            previous anesthesia was also reviewed. The risks                            and benefits of the procedure and the sedation                            options and risks were discussed with the patient.                            All questions were answered, and informed consent                            was obtained. Prior Anticoagulants: The patient has                            taken no previous anticoagulant or antiplatelet                            agents. ASA Grade Assessment: III - A patient with                            severe systemic disease. After reviewing the risks  and benefits, the patient was deemed in                            satisfactory condition to undergo the procedure.                           After obtaining informed consent, the colonoscope                            was passed under direct vision. Throughout the                            procedure, the patient's blood pressure, pulse, and                             oxygen saturations were monitored continuously. The                            CF-HQ190L ([id number]) Olympus colonoscope was                            introduced through the anus and advanced to the the                            cecum, identified by appendiceal orifice and                            ileocecal valve. The colonoscopy was performed                            without difficulty. The patient tolerated the                            procedure well. The quality of the bowel                            preparation was good. The ileocecal valve,                            appendiceal orifice, and rectum were photographed. Scope In: 9:08:04 AM Scope Out: 9:29:48 AM Scope Withdrawal Time: 0 hours 17 minutes 10 seconds  Total Procedure Duration: 0 hours 21 minutes 44 seconds  Findings:      Hemorrhoids were found on perianal exam.      Three sessile polyps were found in the rectum, sigmoid colon and       transverse colon. The polyps were 3 to 5 mm in size. These polyps were       removed with a cold snare. Resection and retrieval were complete.       Estimated blood loss was minimal.      Multiple small and large-mouthed diverticula were found in the entire       colon.      Non-bleeding internal hemorrhoids were found during retroflexion. The       hemorrhoids were small. Impression:               -  Hemorrhoids found on perianal exam.                           - Three 3 to 5 mm polyps in the rectum, in the                            sigmoid colon and in the transverse colon, removed                            with a cold snare. Resected and retrieved.                           - Diverticulosis in the entire examined colon.                           - Non-bleeding internal hemorrhoids. Recommendation:           - Return patient to hospital ward for ongoing care.                           - Resume previous diet today.                           - Continue present medications.                            - Await pathology results.                           - Repeat colonoscopy in 3 - 5 years for                            surveillance based on pathology results.                           - Return to GI office at appointment to be                            scheduled.                           - Use fiber, for example Citrucel, Fibercon, Konsyl                            or Metamucil.                           - Internal hemorrhoids were noted on this study and                            may be amenable to hemorrhoid band ligation. Will                            coordinate at time of outpatient follow-up if any  ongoing symptoms.                           - Recommend Gyn referral for evaluation of uterine                            fibroid noted on CT and tagged RBC scan.                           - Check iron panel, ferritin, B12, folate.                           - GI service will sign off at this time. Please do                            not hesitate to contact us with any additional                            questions or concerns. Will arrange outpatient                            follow-up in the GI clinic.                           - WIll plan for outpatient MRI Liver to assess                            probable hepatic hemangiomas seen on CT.                           - Repeat CBC 7-10 days after hospital discharge. Procedure Code(s):        --- Professional ---                           [phone], Colonoscopy, flexible; with removal of                            tumor(s), polyp(s), or other lesion(s) by snare                            technique Diagnosis Code(s):        --- Professional ---                           E95.2, Other hemorrhoids                           K62.1, Rectal polyp                           K63.5, Polyp of colon                           K92.1, Melena (includes Hematochezia)  D62, Acute  posthemorrhagic anemia                           K57.30, Diverticulosis of large intestine without                            perforation or abscess without bleeding CPT copyright 2019 American Medical Association. All rights reserved. The codes documented in this report are preliminary and upon coder review may  be revised to meet current compliance requirements. Gerrit Heck, MD 05/29/2020 9:40:03 AM Number of Addenda: 0

## 2020-05-29 NOTE — Transfer of Care (Signed)
Immediate Anesthesia Transfer of Care Note  Patient: Taylor Black  Procedure(s) Performed: COLONOSCOPY WITH PROPOFOL (N/A ) ESOPHAGOGASTRODUODENOSCOPY (EGD) WITH PROPOFOL (N/A ) POLYPECTOMY BIOPSY  Patient Location: PACU  Anesthesia Type:MAC  Level of Consciousness: drowsy  Airway & Oxygen Therapy: Patient Spontanous Breathing and Patient connected to nasal cannula oxygen  Post-op Assessment: Report given to RN and Post -op Vital signs reviewed and stable  Post vital signs: Reviewed and stable  Last Vitals:  Vitals Value Taken Time  BP    Temp    Pulse 74 05/29/20 0937  Resp 24 05/29/20 0937  SpO2 96 % 05/29/20 0937  Vitals shown include unvalidated device data.  Last Pain:  Vitals:   05/29/20 0812  TempSrc: Oral  PainSc: 0-No pain         Complications: No complications documented.

## 2020-05-29 NOTE — Op Note (Signed)
[facility] Patient Name: Taylor Black Procedure Date : 05/29/2020 MRN: [id number] Attending MD: Gerrit Heck , MD Date of Birth: [birth year] CSN: [id number] Age: 49 Admit Type: Inpatient Procedure:                Upper GI endoscopy Indications:              Acute post hemorrhagic anemia, Hematochezia Providers:                Gerrit Heck, MD, Clyde Lundborg, RN, Theodora Blow, Technician Referring MD:              Medicines:                Monitored Anesthesia Care Complications:            No immediate complications. Estimated Blood Loss:     Estimated blood loss was minimal. Procedure:                Pre-Anesthesia Assessment:                           - Prior to the procedure, a History and Physical                            was performed, and patient medications and                            allergies were reviewed. The patient's tolerance of                            previous anesthesia was also reviewed. The risks                            and benefits of the procedure and the sedation                            options and risks were discussed with the patient.                            All questions were answered, and informed consent                            was obtained. Prior Anticoagulants: The patient has                            taken no previous anticoagulant or antiplatelet                            agents. ASA Grade Assessment: III - A patient with                            severe systemic disease. After reviewing the risks  and benefits, the patient was deemed in                            satisfactory condition to undergo the procedure.                           After obtaining informed consent, the endoscope was                            passed under direct vision. Throughout the                            procedure, the patient's blood pressure, pulse, and                             oxygen saturations were monitored continuously. The                            GIF-H190 ([id number]) Olympus gastroscope was                            introduced through the mouth, and advanced to the                            second part of duodenum. The upper GI endoscopy was                            accomplished without difficulty. The patient                            tolerated the procedure well. Scope In: Scope Out: Findings:      LA Grade A (one or more mucosal breaks less than 5 mm, not extending       between tops of 2 mucosal folds) esophagitis with no bleeding was found       40 cm from the incisors.      A 2 cm sliding type hiatal hernia was present.      The gastroesophageal flap valve was visualized endoscopically and       classified as Hill Grade III (minimal fold, loose to endoscope, hiatal       hernia likely).      Localized mild inflammation characterized by a few small non-bleeding       erosions and mild erythema was found in the gastric antrum. Biopsies       were taken with a cold forceps for Helicobacter pylori testing.       Estimated blood loss was minimal.      The gastric fundus and gastric body were normal. Additional mucosal       biopsies were taken with a cold forceps for Helicobacter pylori testing.       Estimated blood loss was minimal.      The duodenal bulb, first portion of the duodenum and second portion of       the duodenum were normal. Biopsies for histology were taken with a cold       forceps for evaluation of celiac disease. Estimated blood loss was  minimal. Impression:               - LA Grade A reflux esophagitis with no bleeding.                           - 2 cm hiatal hernia.                           - Gastroesophageal flap valve classified as Hill                            Grade III (minimal fold, loose to endoscope, hiatal                            hernia likely).                           - Gastritis. Biopsied.                            - Normal gastric fundus and gastric body. Biopsied.                           - Normal duodenal bulb, first portion of the                            duodenum and second portion of the duodenum.                            Biopsied. Recommendation:           - Use Prilosec (omeprazole) 40 mg PO BID for 6                            weeks, then reduce to 40 mg daily and titrate to                            lowest effective dose for ongoing control of reflux.                           - Await pathology results.                           - Perform a colonoscopy today. Additional                            recommendations pending colonoscopy findings. Procedure Code(s):        --- Professional ---                           [phone], Esophagogastroduodenoscopy, flexible,                            transoral; with biopsy, single or multiple Diagnosis Code(s):        --- Professional ---  K21.00, Gastro-esophageal reflux disease with                            esophagitis, without bleeding                           K44.9, Diaphragmatic hernia without obstruction or                            gangrene                           K29.70, Gastritis, unspecified, without bleeding                           D62, Acute posthemorrhagic anemia                           K92.1, Melena (includes Hematochezia) CPT copyright 2019 American Medical Association. All rights reserved. The codes documented in this report are preliminary and upon coder review may  be revised to meet current compliance requirements. Gerrit Heck, MD 05/29/2020 9:45:09 AM Number of Addenda: 0

## 2020-05-29 NOTE — Progress Notes (Signed)
                                      [facility]                            [place], [place] [address]      Taylor Black was admitted to the Hospital on 05/26/2020 and Discharged  05/29/2020 and should be excused from work/school   for 5 days starting from date -  05/26/2020 , may return to work/school without any restrictions.  Call Lala Lund MD, [facility]  [phone] with questions.  Lala Lund M.D on 05/29/2020,at 2:54 PM  [facility]   Office  [phone]

## 2020-05-29 NOTE — Anesthesia Preprocedure Evaluation (Addendum)
Anesthesia Evaluation  Patient identified by MRN, date of birth, ID band Patient awake    Reviewed: Allergy & Precautions, NPO status , Patient's Chart, lab work & pertinent test results  Airway Mallampati: I  TM Distance: >3 FB Neck ROM: Full    Dental no notable dental hx. (+) Teeth Intact, Dental Advisory Given   Pulmonary neg pulmonary ROS, Current Smoker,    Pulmonary exam normal breath sounds clear to auscultation       Cardiovascular hypertension, Pt. on medications Normal cardiovascular exam Rhythm:Regular Rate:Normal  Echo 2/21 FINDINGS  Left Ventricle: Left ventricular ejection fraction, by estimation, is 55  to 60%. The left ventricle has normal function. The left ventricle has no  regional wall motion abnormalities. The left ventricular internal cavity  size was normal in size. There is  no left ventricular hypertrophy. Left ventricular diastolic parameters  were normal.   CATH 8/20 Normal left main. Eccentric proximal LAD the 30 to 40% range.  Some views appeared to show a lucency in individual frames.  TIMI grade III flow noted. Normal circumflex, dominant. Nondominant right coronary Mild global hypokinesis with EF 40 to 45%.  LVEDP is normal.    Neuro/Psych PSYCHIATRIC DISORDERS Anxiety Depression negative neurological ROS     GI/Hepatic GERD  Medicated,  Endo/Other  Morbid obesity  Renal/GU   negative genitourinary   Musculoskeletal negative musculoskeletal ROS (+)   Abdominal   Peds negative pediatric ROS (+)  Hematology  (+) Blood dyscrasia, anemia ,   Anesthesia Other Findings   Reproductive/Obstetrics negative OB ROS                            Anesthesia Physical Anesthesia Plan  ASA: III  Anesthesia Plan: MAC   Post-op Pain Management:    Induction: Intravenous  PONV Risk Score and Plan:   Airway Management Planned: Mask, Natural Airway and Nasal  Cannula  Additional Equipment:   Intra-op Plan:   Post-operative Plan:   Informed Consent:   Plan Discussed with: Anesthesiologist and CRNA  Anesthesia Plan Comments:        Anesthesia Quick Evaluation

## 2020-05-29 NOTE — Interval H&P Note (Signed)
History and Physical Interval Note:  05/29/2020 8:52 AM  Taylor Black  has presented today for surgery, with the diagnosis of anemia, passing burgundy bloody stool..  The various methods of treatment have been discussed with the patient and family. After consideration of risks, benefits and other options for treatment, the patient has consented to  Procedure(s): COLONOSCOPY WITH PROPOFOL (N/A) ESOPHAGOGASTRODUODENOSCOPY (EGD) WITH PROPOFOL (N/A) as a surgical intervention.  The patient's history has been reviewed, patient examined, no change in status, stable for surgery.  I have reviewed the patient's chart and labs.  Questions were answered to the patient's satisfaction.     Dominic Pea Tyrica Afzal

## 2020-05-30 ENCOUNTER — Other Ambulatory Visit: Payer: Self-pay

## 2020-05-30 ENCOUNTER — Telehealth: Payer: Self-pay

## 2020-05-30 ENCOUNTER — Encounter (HOSPITAL_COMMUNITY): Payer: Self-pay | Admitting: Gastroenterology

## 2020-05-30 ENCOUNTER — Telehealth: Payer: Self-pay | Admitting: Family Medicine

## 2020-05-30 DIAGNOSIS — D649 Anemia, unspecified: Secondary | ICD-10-CM

## 2020-05-30 LAB — TYPE AND SCREEN
ABO/RH(D): O POS
Antibody Screen: NEGATIVE
Unit division: 0
Unit division: 0
Unit division: 0
Unit division: 0

## 2020-05-30 LAB — SURGICAL PATHOLOGY

## 2020-05-30 LAB — BPAM RBC
Blood Product Expiration Date: 202108242359
Blood Product Expiration Date: 202108242359
Blood Product Expiration Date: 202108242359
Blood Product Expiration Date: 202108242359
ISSUE DATE / TIME: 202107241949
ISSUE DATE / TIME: 202107250030
Unit Type and Rh: 5100
Unit Type and Rh: 5100
Unit Type and Rh: 5100
Unit Type and Rh: 5100

## 2020-05-30 NOTE — Telephone Encounter (Signed)
Spoke with patient this morning regarding lab appointment and follow up appointment.  Patient will go to lab at [facility] for CBC in 7-10 days. She will follow up with Dr Bryan Lemma on 9/13/2 at [facility] location. Patient verbalized understanding.

## 2020-05-30 NOTE — Progress Notes (Unsigned)
cbc

## 2020-05-30 NOTE — Telephone Encounter (Signed)
-----   Message from Taylor Bullion, DO sent at 05/29/2020 10:08 AM EDT ----- Patient needs f/u in the GI clinic with either Dr. Henrene Pastor who initially saw her, or me.   - Repeat CBC in 7-10 days - F/u appt in 4-6 weeks  Thanks.

## 2020-05-30 NOTE — Telephone Encounter (Signed)
Transition Care Management Follow-up Telephone Call  Date of discharge and from where: [facility] on 05/29/2020  How have you been since you were released from the hospital? Patient states that she is doing much better. Does feel some discomfort in her abdomen   Any questions or concerns? No   Items Reviewed:  Did the pt receive and understand the discharge instructions provided? Yes   Medications obtained and verified? Yes   Any new allergies since your discharge? No   Dietary orders reviewed? Yes  Do you have support at home? Yes , patient states her daughter is home to help her.  Functional Questionnaire: (I = Independent and D = Dependent) ADLs: I  Bathing/Dressing- I  Meal Prep- I  Eating- I  Maintaining continence- I  Transferring/Ambulation- I  Managing Meds- I  Follow up appointments reviewed:   PCP Hospital f/u appt confirmed? Yes  Scheduled to see Dr.Jordan on 06/06/2020 @ 10:30am.  [facility] f/u appt confirmed? Yes  Scheduled to see Dr.Cirigilano  on 07/16/2020 @ 9:40 am.  Are transportation arrangements needed? No   If their condition worsens, is the pt aware to call PCP or go to the Emergency Dept.? Yes  Was the patient provided with contact information for the PCP's office or ED? Yes  Was to pt encouraged to call back with questions or concerns? Yes ,

## 2020-06-06 ENCOUNTER — Inpatient Hospital Stay: Payer: 59 | Admitting: Family Medicine

## 2020-06-21 ENCOUNTER — Encounter: Payer: Self-pay | Admitting: Gastroenterology

## 2020-06-29 ENCOUNTER — Telehealth (INDEPENDENT_AMBULATORY_CARE_PROVIDER_SITE_OTHER): Payer: 59 | Admitting: Family Medicine

## 2020-06-29 ENCOUNTER — Encounter: Payer: Self-pay | Admitting: Family Medicine

## 2020-06-29 VITALS — BP 136/88 | HR 81 | Ht 60.0 in

## 2020-06-29 DIAGNOSIS — D500 Iron deficiency anemia secondary to blood loss (chronic): Secondary | ICD-10-CM

## 2020-06-29 DIAGNOSIS — I10 Essential (primary) hypertension: Secondary | ICD-10-CM

## 2020-06-29 DIAGNOSIS — D259 Leiomyoma of uterus, unspecified: Secondary | ICD-10-CM

## 2020-06-29 DIAGNOSIS — G4700 Insomnia, unspecified: Secondary | ICD-10-CM | POA: Diagnosis not present

## 2020-06-29 DIAGNOSIS — K219 Gastro-esophageal reflux disease without esophagitis: Secondary | ICD-10-CM | POA: Diagnosis not present

## 2020-06-29 MED ORDER — OMEPRAZOLE 40 MG PO CPDR: 40.0000 mg | DELAYED_RELEASE_CAPSULE | Freq: Every day | ORAL | 2 refills | Status: DC

## 2020-06-29 MED ORDER — TRAZODONE HCL 50 MG PO TABS: 25.0000 mg | ORAL_TABLET | Freq: Every evening | ORAL | 2 refills | Status: DC | PRN

## 2020-06-29 NOTE — Progress Notes (Addendum)
Virtual Visit via Video Note I connected with Taylor Folmar on 06/29/20 by a video enabled telemedicine application and verified that I am speaking with the correct person using two identifiers.  Location patient: home Location provider:work office Persons participating in the virtual visit: patient, provider  I discussed the limitations of evaluation and management by telemedicine and the availability of in person appointments. The patient expressed understanding and agreed to proceed.  HPI: Taylor Black is a 49 yo female with hx of CAD,GERD,and depression following today on recent hospitalization. She was admitted on 05/26/2020 and discharged home on 05/29/2020. She was having bright red blood per rectum. She received 2 units of packed RBC transfusion on 05/27/2020. Sometimes she has heavy menses. LMP 06/11/20.  Abdomen/pelvic CT: 1. Rectosigmoid diverticulosis without acute inflammation. 2. Fibroid uterus. This likely explains the findings on the patient's recent GI bleeding study. 3. Trace right-sided pleural effusion. 4. Probable hepatic hemangioma in the right hepatic lobe.  Aortic Atherosclerosis (ICD10-I70.0).  EGD mild gastritis.  On admission H/H was 5.4/18.7. 2 packed RBC's transfusion during hospitalization. Anemia thought to be GI etiology, tagged RBC scan was negative. No hx of heavy menses.  Lab Results  Component Value Date   WBC 6.7 05/29/2020   HGB 8.9 (L) 05/29/2020   HCT 28.7 (L) 05/29/2020   MCV 78.8 (L) 05/29/2020   PLT 468 (H) 05/29/2020   Currently she is on ferrous sulfate 325 mg  HH services were not necessary. He states that she is at her baseline.  GERD: Protonix 40 mg twice daily was prescribed at the time of discharge, she could not afford it, so she got omeprazole 20 mg and taking 2 tablets before breakfast.  Medication has helped with heartburn. Negative for abdominal pain, N/V, or melena.  She is just recovering from COVID-19 infection. Mild  residual cough. Negative for fever, chills, fatigue, or wheezing. Still smoking but she has decreased amount of cigarettes per day, now 1 pack of cigarettes last the whole week.  Insomnia: During hospitalization she was on trazodone 50 mg at night as needed, medication seemed to help with depression, anxiety, and sleep. She usually sleeps about 3 to 4 hours at night, interrupted. No side effects reported.  Hypertension: Currently she is on losartan 100 mg daily and carvedilol 25 mg twice daily. BP readings at home 120s/70s. Negative for unusual headache, visual changes, CP, dyspnea, palpitation, gross hematuria, decreased urine output, or foamy urine.  ROS: See pertinent positives and negatives per HPI.  Past Medical History:  Diagnosis Date  . Anxiety   . 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 Chicken pox   . Depression   . GERD (gastroesophageal reflux disease)   . LV dysfunction  06/13/2019    Past Surgical History:  Procedure Laterality Date  . BIOPSY  05/29/2020   Procedure: BIOPSY;  Surgeon: Lavena Bullion, DO;  Location: [facility];  Service: Gastroenterology;;  . bowel obstruction  1989  . COLONOSCOPY WITH PROPOFOL N/A 05/29/2020   Procedure: COLONOSCOPY WITH PROPOFOL;  Surgeon: Lavena Bullion, DO;  Location: [facility];  Service: Gastroenterology;  Laterality: N/A;  . ESOPHAGOGASTRODUODENOSCOPY (EGD) WITH PROPOFOL N/A 05/29/2020   Procedure: ESOPHAGOGASTRODUODENOSCOPY (EGD) WITH PROPOFOL;  Surgeon: Lavena Bullion, DO;  Location: [facility];  Service: Gastroenterology;  Laterality: N/A;  . LEFT HEART CATH AND CORONARY ANGIOGRAPHY N/A 06/13/2019   Procedure: LEFT HEART CATH AND CORONARY ANGIOGRAPHY;  Surgeon: Belva Crome, MD;  Location: [facility]  INVASIVE CV LAB;  Service: Cardiovascular;  Laterality: N/A;  . POLYPECTOMY  05/29/2020   Procedure: POLYPECTOMY;  Surgeon: Lavena Bullion, DO;  Location: [facility];  Service:  Gastroenterology;;  . UMBILICAL HERNIA REPAIR  2017    Family History  Problem Relation Age of Onset  . Cancer Mother   . Heart disease Father   . Stroke Father   . Hypertension Father   . Kidney disease Father   . Diabetes Father   . Alcohol abuse Father   . Depression Paternal Grandmother   . Kidney disease Paternal Grandmother     Social History   Socioeconomic History  . Marital status: Legally Separated    Spouse name: Not on file  . Number of children: Not on file  . Years of education: Not on file  . Highest education level: Not on file  Occupational History  . Not on file  Tobacco Use  . Smoking status: Current Every Day Smoker  . Smokeless tobacco: Never Used  Substance and Sexual Activity  . Alcohol use: Yes  . Drug use: Yes    Types: Marijuana  . Sexual activity: Not Currently  Other Topics Concern  . Not on file  Social History Narrative  . Not on file   Social Determinants of Health   Financial Resource Strain:   . Difficulty of Paying Living Expenses: Not on file  Food Insecurity:   . Worried About Charity fundraiser in the Last Year: Not on file  . Ran Out of Food in the Last Year: Not on file  Transportation Needs:   . Lack of Transportation (Medical): Not on file  . Lack of Transportation (Non-Medical): Not on file  Physical Activity:   . Days of Exercise per Week: Not on file  . Minutes of Exercise per Session: Not on file  Stress:   . Feeling of Stress : Not on file  Social Connections:   . Frequency of Communication with Friends and Family: Not on file  . Frequency of Social Gatherings with Friends and Family: Not on file  . Attends Religious Services: Not on file  . Active Member of Clubs or Organizations: Not on file  . Attends Archivist Meetings: Not on file  . Marital Status: Not on file  Intimate Partner Violence:   . Fear of Current or Ex-Partner: Not on file  . Emotionally Abused: Not on file  . Physically Abused:  Not on file  . Sexually Abused: Not on file    Current Outpatient Medications:  .  aspirin EC 81 MG tablet, Take 1 tablet (81 mg total) by mouth daily with breakfast. Swallow whole., Disp: 30 tablet, Rfl: 11 .  atorvastatin (LIPITOR) 20 MG tablet, Take 1 tablet (20 mg total) by mouth daily at 6 PM. (Patient taking differently: Take 20 mg by mouth daily with supper. ), Disp: 90 tablet, Rfl: 3 .  carvedilol (COREG) 25 MG tablet, Take 1 tablet (25 mg total) by mouth 2 (two) times daily with a meal., Disp: 180 tablet, Rfl: 3 .  ferrous sulfate 325 (65 FE) MG tablet, Take 1 tablet (325 mg total) by mouth 2 (two) times daily with a meal., Disp: 60 tablet, Rfl: 0 .  folic acid (FOLVITE) 1 MG tablet, Take 1 tablet (1 mg total) by mouth daily., Disp: 30 tablet, Rfl: 0 .  losartan (COZAAR) 100 MG tablet, TAKE 1 TABLET DAILY (Patient taking differently: Take 100 mg by mouth daily with breakfast. ),  Disp: 90 tablet, Rfl: 1 .  omeprazole (PRILOSEC) 40 MG capsule, Take 1 capsule (40 mg total) by mouth daily., Disp: 90 capsule, Rfl: 2 .  traZODone (DESYREL) 50 MG tablet, Take 0.5-1 tablets (25-50 mg total) by mouth at bedtime as needed for sleep., Disp: 90 tablet, Rfl: 2  EXAM:  VITALS per patient if applicable:BP 628/36   Pulse 81   Ht 5' (1.524 m)   LMP 06/11/2020   BMI 39.06 kg/m   GENERAL: alert, oriented, appears well and in no acute distress  HEENT: atraumatic, conjunctiva clear, no obvious abnormalities.  NECK: normal movements of the head and neck  LUNGS: on inspection no signs of respiratory distress, breathing rate appears normal, no obvious gross SOB, gasping or wheezing  CV: no obvious cyanosis  Taylor: moves all visible extremities without noticeable abnormality  PSYCH/NEURO: pleasant and cooperative, no obvious depression or anxiety, speech and thought processing grossly intact  ASSESSMENT AND PLAN:  Discussed the following assessment and plan:  Gastroesophageal reflux disease,  unspecified whether esophagitis present - Plan: omeprazole (PRILOSEC) 40 MG capsule Problem is well controlled with omeprazole,so no changes. GERD precautions also recommended.  Hypertension, unspecified type - Plan: BASIC METABOLIC PANEL WITH GFR BP adequately controlled. No changes in current management. Continue monitoring BP regularly.  Uterine leiomyoma, unspecified location - Plan: Ambulatory referral to Gynecology, CBC with Differential/Platelet Asymptomatic. Educated about Dx. Continue following with gyn,referral placed.  Insomnia, unspecified type - Plan: traZODone (DESYREL) 50 MG tablet Trazodone is helping, so no changes. Good sleep hygiene also recommended. Iron deficiency anemia due to chronic blood loss  I discussed the assessment and treatment plan with the patient. Taylor Black was provided an opportunity to ask questions and all were answered. She greed with the plan and demonstrated an understanding of the instructions.    Return in about 4 months (around 10/29/2020) for CPE.  Needs lab appt, ideally 07/12/2020.Marland Kitchen  Layaan Mott [name], MD

## 2020-07-01 ENCOUNTER — Encounter: Payer: Self-pay | Admitting: Family Medicine

## 2020-07-03 ENCOUNTER — Other Ambulatory Visit: Payer: Self-pay | Admitting: Cardiology

## 2020-07-16 ENCOUNTER — Ambulatory Visit: Payer: 59 | Admitting: Gastroenterology

## 2020-07-26 ENCOUNTER — Ambulatory Visit (INDEPENDENT_AMBULATORY_CARE_PROVIDER_SITE_OTHER): Payer: 59 | Admitting: Obstetrics and Gynecology

## 2020-07-26 ENCOUNTER — Other Ambulatory Visit: Payer: Self-pay

## 2020-07-26 ENCOUNTER — Encounter: Payer: Self-pay | Admitting: Obstetrics and Gynecology

## 2020-07-26 VITALS — BP 124/80 | Ht 63.0 in | Wt 194.0 lb

## 2020-07-26 DIAGNOSIS — D259 Leiomyoma of uterus, unspecified: Secondary | ICD-10-CM | POA: Diagnosis not present

## 2020-07-26 DIAGNOSIS — D500 Iron deficiency anemia secondary to blood loss (chronic): Secondary | ICD-10-CM

## 2020-07-26 DIAGNOSIS — N920 Excessive and frequent menstruation with regular cycle: Secondary | ICD-10-CM | POA: Diagnosis not present

## 2020-07-26 MED ORDER — MEDROXYPROGESTERONE ACETATE 10 MG PO TABS
10.0000 mg | ORAL_TABLET | Freq: Every day | ORAL | 3 refills | Status: DC
Start: 2020-08-02 — End: 2023-06-26

## 2020-07-26 NOTE — Progress Notes (Signed)
Kazia June Mcglaun [birth year] [id number]  SUBJECTIVE:  49 y.o. [id number] female new patient presents for dental finding of fibroids on CT of the abdomen pelvis.  She describes her periods are heavy, feels pelvic pain like a 'grabbing' sensation but only when on her period.  No other abdominal pain or pressure symptoms.  Period lasts 5 days, changing a pad twice very 2 hours, thin overnight maxi pad, and says she has this level of bleeding for 3-4 days, little bit heavier than it used to be she was younger.  Not much dysmenorrhea, just a little ache and that "grabbing" sensation resolves a few days of the period.  Reasonable cramping on period which is manageable, no excruciating discomfort.  No intermenstrual bleeding.  Periods are regular and once per month.  Went to the ED with profuse rectal bleeding a few months ago that have gotten worse over the course of 2-3 days and she got progressively worse anemia symptoms with lightheadedness and chest pain.  Her hemoglobin was 5.3 at her initial evaluation and she said she got a blood transfusion.  GI evaluation suggested diverticulosis and she says she had some colon polyps removed which she says were benign.  She was not having a period at that point.  The CT of the abdomen pelvis showed an enlarged fibroid uterus.   She is not sexually active/is abstinent.  She is yet to repeat a CBC after her hemoglobin was up to 8.9 after the transfusion.   Current Outpatient Medications  Medication Sig Dispense Refill  . aspirin EC 81 MG tablet Take 1 tablet (81 mg total) by mouth daily with breakfast. Swallow whole. 30 tablet 11  . atorvastatin (LIPITOR) 20 MG tablet TAKE 1 TABLET(20 MG) BY MOUTH DAILY AT 6 PM 30 tablet 7  . carvedilol (COREG) 25 MG tablet TAKE 1 TABLET(25 MG) BY MOUTH TWICE DAILY WITH A MEAL 60 tablet 7  . ferrous sulfate 325 (65 FE) MG tablet Take 1 tablet (325 mg total) by mouth 2 (two) times daily with a meal. 60 tablet 0  . losartan (COZAAR)  100 MG tablet TAKE 1 TABLET DAILY (Patient taking differently: Take 100 mg by mouth daily with breakfast. ) 90 tablet 1  . omeprazole (PRILOSEC) 40 MG capsule Take 1 capsule (40 mg total) by mouth daily. 90 capsule 2  . traZODone (DESYREL) 50 MG tablet Take 0.5-1 tablets (25-50 mg total) by mouth at bedtime as needed for sleep. 90 tablet 2  . folic acid (FOLVITE) 1 MG tablet Take 1 tablet (1 mg total) by mouth daily. (Patient not taking: Reported on 07/26/2020) 30 tablet 0   No current facility-administered medications for this visit.   Allergies: Morphine and related  No LMP recorded. Patient is premenopausal.  Past medical history,surgical history, problem list, medications, allergies, family history and social history were all reviewed and documented as reviewed in the EPIC chart.  ROS: Positives and negatives as reviewed in HPI   OBJECTIVE:  BP 124/80   Ht 5\' 3"  (1.6 m)   Wt 194 lb (88 kg)   BMI 34.37 kg/m  The patient appears well, alert, oriented, in no distress.  PELVIC EXAM: Offered but declined by patient, she wants to return for a pelvic exam and Pap smear at visit in the near future   ASSESSMENT:  49 y.o. [id number] here for discussion of an segmental finding of fibroid uterus, phonically heavy menses/menorrhagia  PLAN:  Patient says that she was unaware that she had uterine  fibroids. Her fibroids may contribute to her heavy periods and they also play a role in the dysmenorrhea.  Her periods sound to be fairly heavy, but it sounds like her recent acute anemia was primarily due to gastrointestinal bleeding.  I do not see any prior imaging in our EMR to indicate whether not these fibroids are new or not.  We spent time today discussing fibroids and how they are a vast majority of the time benign findings.  I did discuss some of the symptoms that would potentially indicate rapid growth of the fibroid such as increased pelvic pressure, discomfort, changes in bowel habits, or  increased vaginal bleeding and/or heavier periods.  Should she experience these symptoms she should come back for evaluation.  Even with fibroids that do grow rapidly, malignancy is very uncommon.  I discussed that as patients enter menopause fibroids are expected to stabilize in size and usually shrink.  We discussed treatment options for abnormal uterine bleeding.  She is a light smoker having cut back to only a few cigarettes per week but I still think she will be a good idea with her other comorbidities to avoid estrogen as that would increase her risk profile for thrombosis.  We discussed progestin only methods including cyclic Provera 10 mg daily x10 days to start when she is starting her period.  She was comfortable with this option and wanted to start this so I did give her a prescription for 10 tablets with 3 refills. If her periods continue to be heavy or her bleeding gets any worse I want her to return for evaluation.  If bleeding and/or pain symptoms gets worse I also would want to check endometrial biopsy and ultrasound and consider other progestin options vs surgery. I encouraged her to return for a pelvic exam and Pap smear soon as she did not want to do this today.  She said she would schedule upon leaving today.   Joseph Pierini MD 07/26/20

## 2020-07-26 NOTE — Patient Instructions (Signed)
Menorrhagia  Menorrhagia is a condition in which menstrual periods are heavy or last longer than normal. With menorrhagia, most periods a woman has may cause enough blood loss and cramping that she becomes unable to take part in her usual activities. What are the causes? Common causes of this condition include:  Noncancerous growths in the uterus (polyps or fibroids).  An imbalance of the estrogen and progesterone hormones.  One of the ovaries not releasing an egg during one or more months.  A problem with the thyroid gland (hypothyroid).  Side effects of having an intrauterine device (IUD).  Side effects of some medicines, such as anti-inflammatory medicines or blood thinners.  A bleeding disorder that stops the blood from clotting normally. In some cases, the cause of this condition is not known. What are the signs or symptoms? Symptoms of this condition include:  Routinely having to change your pad or tampon every 1-2 hours because it is completely soaked.  Needing to use pads and tampons at the same time because of heavy bleeding.  Needing to wake up to change your pads or tampons during the night.  Passing blood clots larger than 1 inch (2.5 cm) in size.  Having bleeding that lasts for more than 7 days.  Having symptoms of low iron levels (anemia), such as tiredness, fatigue, or shortness of breath. How is this diagnosed? This condition may be diagnosed based on:  A physical exam.  Your symptoms and menstrual history.  Tests, such as: ? Blood tests to check if you are pregnant or have hormonal changes, a bleeding or thyroid disorder, anemia, or other problems. ? Pap test to check for cancerous changes, infections, or inflammation. ? Endometrial biopsy. This test involves removing a tissue sample from the lining of the uterus (endometrium) to be examined under a microscope. ? Pelvic ultrasound. This test uses sound waves to create images of your uterus, ovaries, and  vagina. The images can show if you have fibroids or other growths. ? Hysteroscopy. For this test, a small telescope is used to look inside your uterus. How is this treated? Treatment may not be needed for this condition. If it is needed, the best treatment for you will depend on:  Whether you need to prevent pregnancy.  Your desire to have children in the future.  The cause and severity of your bleeding.  Your personal preference. Medicines are the first step in treatment. You may be treated with:  Hormonal birth control methods. These treatments reduce bleeding during your menstrual period. They include: ? Birth control pills. ? Skin patch. ? Vaginal ring. ? Shots (injections) that you get every 3 months. ? Hormonal IUD (intrauterine device). ? Implants that go under the skin.  Medicines that thicken blood and slow bleeding.  Medicines that reduce swelling, such as ibuprofen.  Medicines that contain an artificial (synthetic) hormone called progestin.  Medicines that make the ovaries stop working for a short time.  Iron supplements to treat anemia. If medicines do not work, surgery may be done. Surgical options may include:  Dilation and curettage (D&C). In this procedure, your health care provider opens (dilates) your cervix and then scrapes or suctions tissue from the endometrium to reduce menstrual bleeding.  Operative hysteroscopy. In this procedure, a small tube with a light on the end (hysteroscope) is used to view your uterus and help remove polyps that may be causing heavy periods.  Endometrial ablation. This is when various techniques are used to permanently destroy your entire endometrium.  After endometrial ablation, most women have little or no menstrual flow. This procedure reduces your ability to become pregnant.  Endometrial resection. In this procedure, an electrosurgical wire loop is used to remove the endometrium. This procedure reduces your ability to become  pregnant.  Hysterectomy. This is surgical removal of the uterus. This is a permanent procedure that stops menstrual periods. Pregnancy is not possible after a hysterectomy. Follow these instructions at home: Medicines  Take over-the-counter and prescription medicines exactly as told by your health care provider. This includes iron pills.  Do not change or switch medicines without asking your health care provider.  Do not take aspirin or medicines that contain aspirin 1 week before or during your menstrual period. Aspirin may make bleeding worse. General instructions  If you need to change your sanitary pad or tampon more than once every 2 hours, limit your activity until the bleeding stops.  Iron pills can cause constipation. To prevent or treat constipation while you are taking prescription iron supplements, your health care provider may recommend that you: ? Drink enough fluid to keep your urine clear or pale yellow. ? Take over-the-counter or prescription medicines. ? Eat foods that are high in fiber, such as fresh fruits and vegetables, whole grains, and beans. ? Limit foods that are high in fat and processed sugars, such as fried and sweet foods.  Eat well-balanced meals, including foods that are high in iron. Foods that have a lot of iron include leafy green vegetables, meat, liver, eggs, and whole grain breads and cereals.  Do not try to lose weight until the abnormal bleeding has stopped and your blood iron level is back to normal. If you need to lose weight, work with your health care provider to lose weight safely.  Keep all follow-up visits as told by your health care provider. This is important. Contact a health care provider if:  You soak through a pad or tampon every 1 or 2 hours, and this happens every time you have a period.  You need to use pads and tampons at the same time because you are bleeding so much.  You have nausea, vomiting, diarrhea, or other problems  related to medicines you are taking. Get help right away if:  You soak through more than a pad or tampon in 1 hour.  You pass clots bigger than 1 inch (2.5 cm) wide.  You feel short of breath.  You feel like your heart is beating too fast.  You feel dizzy or faint.  You feel very weak or tired. Summary  Menorrhagia is a condition in which menstrual periods are heavy or last longer than normal.  Treatment will depend on the cause of the condition and may include medicines or procedures.  Take over-the-counter and prescription medicines exactly as told by your health care provider. This includes iron pills.  Get help right away if you have heavy bleeding that soaks through more than a pad or tampon in 1 hour, you are passing large clots, or you feel dizzy, faint or short of breath. This information is not intended to replace advice given to you by your health care provider. Make sure you discuss any questions you have with your health care provider. Document Revised: 01/27/2018 Document Reviewed: 10/13/2016 Elsevier Patient Education  [employer].  Uterine Fibroids  Uterine fibroids (leiomyomas) are noncancerous (benign) tumors that can develop in the uterus. Fibroids may also develop in the fallopian tubes, cervix, or tissues (ligaments) near the uterus. You  may have one or many fibroids. Fibroids vary in size, weight, and where they grow in the uterus. Some can become quite large. Most fibroids do not require medical treatment. What are the causes? The cause of this condition is not known. What increases the risk? You are more likely to develop this condition if you:  Are in your 30s or 40s and have not gone through menopause.  Have a family history of this condition.  Are of African-American descent.  Had your first period at an early age (early menarche).  Have not had any children (nulliparity).  Are overweight or obese. What are the signs or symptoms? Many  women do not have any symptoms. Symptoms of this condition may include:  Heavy menstrual bleeding.  Bleeding or spotting between periods.  Pain and pressure in the pelvic area, between the hips.  Bladder problems, such as needing to urinate urgently or more often than usual.  Inability to have children (infertility).  Failure to carry pregnancy to term (miscarriage). How is this diagnosed? This condition may be diagnosed based on:  Your symptoms and medical history.  A physical exam.  A pelvic exam that includes feeling for any tumors.  Imaging tests, such as ultrasound or MRI. How is this treated? Treatment for this condition may include:  Seeing your health care provider for follow-up visits to monitor your fibroids for any changes.  Taking NSAIDs such as ibuprofen, naproxen, or aspirin to reduce pain.  Hormone medicines. These may be taken as a pill, given in an injection, or delivered by a T-shaped device that is inserted into the uterus (intrauterine device, IUD).  Surgery to remove one of the following: ? The fibroids (myomectomy). Your health care provider may recommend this if fibroids affect your fertility and you want to become pregnant. ? The uterus (hysterectomy). ? Blood supply to the fibroids (uterine artery embolization). Follow these instructions at home:  Take over-the-counter and prescription medicines only as told by your health care provider.  Ask your health care provider if you should take iron pills or eat more iron-rich foods, such as dark green, leafy vegetables. Heavy menstrual bleeding can cause low iron levels.  If directed, apply heat to your back or abdomen to reduce pain. Use the heat source that your health care provider recommends, such as a moist heat pack or a heating pad. ? Place a towel between your skin and the heat source. ? Leave the heat on for 20-30 minutes. ? Remove the heat if your skin turns bright red. This is especially  important if you are unable to feel pain, heat, or cold. You may have a greater risk of getting burned.  Pay close attention to your menstrual cycle. Tell your health care provider about any changes, such as: ? Increased blood flow that requires you to use more pads or tampons than usual. ? A change in the number of days that your period lasts. ? A change in symptoms that are associated with your period, such as back pain or cramps in your abdomen.  Keep all follow-up visits as told by your health care provider. This is important, especially if your fibroids need to be monitored for any changes. Contact a health care provider if you:  Have pelvic pain, back pain, or cramps in your abdomen that do not get better with medicine or heat.  Develop new bleeding between periods.  Have increased bleeding during or between periods.  Feel unusually tired or weak.  Feel light-headed.  Get help right away if you:  Faint.  Have pelvic pain that suddenly gets worse.  Have severe vaginal bleeding that soaks a tampon or pad in 30 minutes or less. Summary  Uterine fibroids are noncancerous (benign) tumors that can develop in the uterus.  The exact cause of this condition is not known.  Most fibroids do not require medical treatment unless they affect your ability to have children (fertility).  Contact a health care provider if you have pelvic pain, back pain, or cramps in your abdomen that do not get better with medicines.  Make sure you know what symptoms should cause you to get help right away. This information is not intended to replace advice given to you by your health care provider. Make sure you discuss any questions you have with your health care provider. Document Revised: 10/02/2017 Document Reviewed: 09/15/2017 Elsevier Patient Education  2020 [employer].

## 2020-08-21 ENCOUNTER — Ambulatory Visit: Payer: 59 | Admitting: Gastroenterology

## 2020-08-27 ENCOUNTER — Encounter: Payer: Self-pay | Admitting: Family Medicine

## 2020-08-27 NOTE — Telephone Encounter (Signed)
Patient took herself out of work Yesterday (08/26/2020) and wants to go back to work Wednesday (08/29/2020)  She said that Dr. [name] knows what's going on in her life right now and that she just needed to take some personal time.  She would like to see if Dr. [name] can write her a work note.  Please advsie

## 2020-08-31 ENCOUNTER — Encounter: Payer: Self-pay | Admitting: Family Medicine

## 2020-09-03 ENCOUNTER — Other Ambulatory Visit: Payer: Self-pay | Admitting: Family Medicine

## 2020-09-03 DIAGNOSIS — I10 Essential (primary) hypertension: Secondary | ICD-10-CM

## 2020-12-04 ENCOUNTER — Ambulatory Visit (HOSPITAL_COMMUNITY)
Admission: EM | Admit: 2020-12-04 | Discharge: 2020-12-04 | Disposition: A | Discharge status: Home / Self Care | Payer: 59 | Attending: Urgent Care | Admitting: Urgent Care

## 2020-12-04 ENCOUNTER — Ambulatory Visit (INDEPENDENT_AMBULATORY_CARE_PROVIDER_SITE_OTHER): Payer: 59

## 2020-12-04 ENCOUNTER — Other Ambulatory Visit: Payer: Self-pay

## 2020-12-04 ENCOUNTER — Encounter (HOSPITAL_COMMUNITY): Payer: Self-pay | Admitting: *Deleted

## 2020-12-04 DIAGNOSIS — B349 Viral infection, unspecified: Secondary | ICD-10-CM | POA: Diagnosis not present

## 2020-12-04 DIAGNOSIS — J029 Acute pharyngitis, unspecified: Secondary | ICD-10-CM | POA: Diagnosis present

## 2020-12-04 DIAGNOSIS — U071 COVID-19: Secondary | ICD-10-CM | POA: Insufficient documentation

## 2020-12-04 DIAGNOSIS — Z8249 Family history of ischemic heart disease and other diseases of the circulatory system: Secondary | ICD-10-CM | POA: Diagnosis not present

## 2020-12-04 DIAGNOSIS — R0989 Other specified symptoms and signs involving the circulatory and respiratory systems: Secondary | ICD-10-CM

## 2020-12-04 DIAGNOSIS — I2510 Atherosclerotic heart disease of native coronary artery without angina pectoris: Secondary | ICD-10-CM | POA: Diagnosis not present

## 2020-12-04 DIAGNOSIS — F17200 Nicotine dependence, unspecified, uncomplicated: Secondary | ICD-10-CM | POA: Diagnosis not present

## 2020-12-04 DIAGNOSIS — Z885 Allergy status to narcotic agent status: Secondary | ICD-10-CM | POA: Diagnosis not present

## 2020-12-04 DIAGNOSIS — Z79899 Other long term (current) drug therapy: Secondary | ICD-10-CM | POA: Diagnosis not present

## 2020-12-04 DIAGNOSIS — Z7982 Long term (current) use of aspirin: Secondary | ICD-10-CM | POA: Insufficient documentation

## 2020-12-04 DIAGNOSIS — I252 Old myocardial infarction: Secondary | ICD-10-CM | POA: Insufficient documentation

## 2020-12-04 LAB — SARS CORONAVIRUS 2 (TAT 6-24 HRS): SARS Coronavirus 2: POSITIVE — AB

## 2020-12-04 MED ORDER — PREDNISONE 20 MG PO TABS
ORAL_TABLET | ORAL | 0 refills | Status: DC
Start: 2020-12-04 — End: 2023-06-26

## 2020-12-04 MED ORDER — ALBUTEROL SULFATE HFA 108 (90 BASE) MCG/ACT IN AERS: 1.0000 | INHALATION_SPRAY | Freq: Four times a day (QID) | RESPIRATORY_TRACT | 0 refills | Status: DC | PRN

## 2020-12-04 NOTE — ED Provider Notes (Signed)
[place]   MRN: [id number] DOB: 12-Mar-1971  Subjective:   Taylor Black is a 50 y.o. female presenting for 1 day history of cute onset throat pain, headache, diarrhea, stomach upset.  Patient is a smoker.  Denies chest pain, shortness of breath.  She is not Covid vaccinated.  She has a history of CAD, NSTEMI.  No current facility-administered medications for this encounter.  Current Outpatient Medications:  .  aspirin EC 81 MG tablet, Take 1 tablet (81 mg total) by mouth daily with breakfast. Swallow whole., Disp: 30 tablet, Rfl: 11 .  atorvastatin (LIPITOR) 20 MG tablet, TAKE 1 TABLET DAILY AT 6PM, Disp: 90 tablet, Rfl: 3 .  carvedilol (COREG) 25 MG tablet, TAKE 1 TABLET TWICE DAILY  WITH MEALS, Disp: 180 tablet, Rfl: 3 .  ferrous sulfate 325 (65 FE) MG tablet, Take 1 tablet (325 mg total) by mouth 2 (two) times daily with a meal., Disp: 60 tablet, Rfl: 0 .  folic acid (FOLVITE) 1 MG tablet, Take 1 tablet (1 mg total) by mouth daily., Disp: 30 tablet, Rfl: 0 .  losartan (COZAAR) 100 MG tablet, TAKE 1 TABLET DAILY, Disp: 90 tablet, Rfl: 3 .  omeprazole (PRILOSEC) 40 MG capsule, Take 1 capsule (40 mg total) by mouth daily., Disp: 90 capsule, Rfl: 2 .  traZODone (DESYREL) 50 MG tablet, Take 0.5-1 tablets (25-50 mg total) by mouth at bedtime as needed for sleep., Disp: 90 tablet, Rfl: 2 .  medroxyPROGESTERone (PROVERA) 10 MG tablet, Take 1 tablet (10 mg total) by mouth daily. Start when you get a period and continue for 10 days, Disp: 10 tablet, Rfl: 3   Allergies  Allergen Reactions  . Morphine And Related Hives and Other (See Comments)    Profuse sweating    Past Medical History:  Diagnosis Date  . Anxiety   . 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 Chicken pox   . Depression   . GERD (gastroesophageal reflux disease)   . LV dysfunction  06/13/2019     Past Surgical History:  Procedure Laterality Date  . BIOPSY   05/29/2020   Procedure: BIOPSY;  Surgeon: Lavena Bullion, DO;  Location: [facility];  Service: Gastroenterology;;  . bowel obstruction  1989  . COLONOSCOPY WITH PROPOFOL N/A 05/29/2020   Procedure: COLONOSCOPY WITH PROPOFOL;  Surgeon: Lavena Bullion, DO;  Location: [facility];  Service: Gastroenterology;  Laterality: N/A;  . ESOPHAGOGASTRODUODENOSCOPY (EGD) WITH PROPOFOL N/A 05/29/2020   Procedure: ESOPHAGOGASTRODUODENOSCOPY (EGD) WITH PROPOFOL;  Surgeon: Lavena Bullion, DO;  Location: [facility];  Service: Gastroenterology;  Laterality: N/A;  . LEFT HEART CATH AND CORONARY ANGIOGRAPHY N/A 06/13/2019   Procedure: LEFT HEART CATH AND CORONARY ANGIOGRAPHY;  Surgeon: Belva Crome, MD;  Location: [facility];  Service: Cardiovascular;  Laterality: N/A;  . POLYPECTOMY  05/29/2020   Procedure: POLYPECTOMY;  Surgeon: Lavena Bullion, DO;  Location: [facility];  Service: Gastroenterology;;  . UMBILICAL HERNIA REPAIR  2017    Family History  Problem Relation Age of Onset  . Heart disease Father   . Stroke Father   . Hypertension Father   . Kidney disease Father   . Diabetes Father   . Alcohol abuse Father   . Depression Paternal Grandmother   . Kidney disease Paternal Grandmother   . Cancer Maternal Grandmother        Cervical    Social History   Tobacco Use  .  Smoking status: Current Every Day Smoker  . Smokeless tobacco: Never Used  Vaping Use  . Vaping Use: Never used  Substance Use Topics  . Alcohol use: Yes    Alcohol/week: 3.0 standard drinks    Types: 3 Standard drinks or equivalent per week  . Drug use: Yes    Types: Marijuana    ROS   Objective:   Vitals: BP 128/84 (BP Location: Right Arm)   Pulse 70   Temp 99.1 F (37.3 C) (Oral)   Resp 20   SpO2 100%   Physical Exam Constitutional:      General: She is not in acute distress.    Appearance: Normal appearance. She is well-developed. She is not ill-appearing, toxic-appearing or  diaphoretic.  HENT:     Head: Normocephalic and atraumatic.     Nose: Nose normal.     Mouth/Throat:     Mouth: Mucous membranes are moist.  Eyes:     Extraocular Movements: Extraocular movements intact.     Pupils: Pupils are equal, round, and reactive to light.  Cardiovascular:     Rate and Rhythm: Normal rate and regular rhythm.     Pulses: Normal pulses.     Heart sounds: Normal heart sounds. No murmur heard. No friction rub. No gallop.   Pulmonary:     Effort: Pulmonary effort is normal. No respiratory distress.     Breath sounds: No stridor. Rhonchi (bilaterally over mid to lower lung fields) present. No wheezing or rales.  Skin:    General: Skin is warm and dry.     Findings: No rash.  Neurological:     Mental Status: She is alert and oriented to person, place, and time.  Psychiatric:        Mood and Affect: Mood normal.        Behavior: Behavior normal.        Thought Content: Thought content normal.     DG Chest 2 View  Result Date: 12/04/2020 CLINICAL DATA:  Coarse lung sounds. EXAM: CHEST - 2 VIEW COMPARISON:  May 26, 2020. FINDINGS: The heart size and mediastinal contours are within normal limits. Both lungs are clear. The visualized skeletal structures are unremarkable. IMPRESSION: No active cardiopulmonary disease. Electronically Signed   By: Marijo Conception M.D.   On: 12/04/2020 09:35    Assessment and Plan :   PDMP not reviewed this encounter.  1. Viral syndrome   2. Abnormal lung sounds   3. Smoker     In light of her smoking, lung exam or use an oral prednisone course, albuterol.  Patient does not endorse shortness of breath or had tachycardia and therefore low suspicion for chest PE.  Will manage for viral illness such as viral URI, viral syndrome, viral rhinitis, COVID-19. Counseled patient on nature of COVID-19 including modes of transmission, diagnostic testing, management and supportive care.  Offered scripts for symptomatic relief. COVID 19 testing  is pending. Counseled patient on potential for adverse effects with medications prescribed/recommended today, ER and return-to-clinic precautions discussed, patient verbalized understanding.     Jaynee Eagles, PA-C 12/04/20 1038

## 2020-12-04 NOTE — ED Triage Notes (Signed)
Pt reports she felt bad last night and Sx's started with sore throat, HA ,diarrhea. Pt denies any sick contact .

## 2020-12-04 NOTE — Discharge Instructions (Addendum)

## 2020-12-05 ENCOUNTER — Telehealth: Payer: Self-pay

## 2020-12-05 NOTE — Telephone Encounter (Signed)
Called to discuss with patient about COVID-19 symptoms and the use of one of the available treatments for those with mild to moderate Covid symptoms and at a high risk of hospitalization.  Pt appears to qualify for outpatient treatment due to co-morbid conditions and/or a member of an at-risk group in accordance with the FDA Emergency Use Authorization.    Symptom onset: 12/03/20 Vaccinated: No Booster? No Immunocompromised? No Qualifiers: CAD,HTN. Pt. Declines any further treatment.  Marcello Moores

## 2021-03-24 ENCOUNTER — Other Ambulatory Visit: Payer: Self-pay | Admitting: Family Medicine

## 2021-03-24 DIAGNOSIS — G4700 Insomnia, unspecified: Secondary | ICD-10-CM

## 2021-03-24 DIAGNOSIS — K219 Gastro-esophageal reflux disease without esophagitis: Secondary | ICD-10-CM

## 2021-06-15 ENCOUNTER — Other Ambulatory Visit: Payer: Self-pay | Admitting: Family Medicine

## 2021-06-15 DIAGNOSIS — K219 Gastro-esophageal reflux disease without esophagitis: Secondary | ICD-10-CM

## 2021-06-15 DIAGNOSIS — G4700 Insomnia, unspecified: Secondary | ICD-10-CM

## 2021-08-22 ENCOUNTER — Other Ambulatory Visit: Payer: Self-pay | Admitting: Family Medicine

## 2021-08-22 DIAGNOSIS — I10 Essential (primary) hypertension: Secondary | ICD-10-CM

## 2021-09-19 ENCOUNTER — Telehealth: Payer: 59 | Admitting: Physician Assistant

## 2021-09-19 DIAGNOSIS — J069 Acute upper respiratory infection, unspecified: Secondary | ICD-10-CM | POA: Diagnosis not present

## 2021-09-19 NOTE — Progress Notes (Signed)
I have spent 5 minutes in review of e-visit questionnaire, review and updating patient chart, medical decision making and response to patient.   Diesha Rostad Cody Dairon Procter, PA-C    

## 2021-09-19 NOTE — Progress Notes (Signed)
E-Visit for Upper Respiratory Infection   We are sorry you are not feeling well.  Here is how we plan to help!  Based on what you have shared with me, it looks like you may have a viral upper respiratory infection.   I am concerned for an initial false-negative COVID test, giving change in taste. I would recommend retesting for this and quarantining until results are in. In the mean time please follow care instructions below.   I also need you to select a local pharmacy for me to send medication to.   Symptoms vary from person to person, with common symptoms including sore throat, cough, fatigue or lack of energy and feeling of general discomfort.  A low-grade fever of up to 100.4 may present, but is often uncommon.  Symptoms vary however, and are closely related to a person's age or underlying illnesses.  The most common symptoms associated with an upper respiratory infection are nasal discharge or congestion, cough, sneezing, headache and pressure in the ears and face.  These symptoms usually persist for about 3 to 10 days, but can last up to 2 weeks.  It is important to know that upper respiratory infections do not cause serious illness or complications in most cases.    Upper respiratory infections can be transmitted from person to person, with the most common method of transmission being a person's hands.  The virus is able to live on the skin and can infect other persons for up to 2 hours after direct contact.  Also, these can be transmitted when someone coughs or sneezes; thus, it is important to cover the mouth to reduce this risk.  To keep the spread of the illness at [place], good hand hygiene is very important.  This is an infection that is most likely caused by a virus. There are no specific treatments other than to help you with the symptoms until the infection runs its course.  We are sorry you are not feeling well.  Here is how we plan to help!   For nasal congestion, you may use an oral  decongestants such as Mucinex D or if you have glaucoma or high blood pressure use plain Mucinex.  Saline nasal spray or nasal drops can help and can safely be used as often as needed for congestion.  For your congestion, I have prescribed Fluticasone nasal spray one spray in each nostril twice a day  If you do not have a history of heart disease, hypertension, diabetes or thyroid disease, prostate/bladder issues or glaucoma, you may also use Sudafed to treat nasal congestion.  It is highly recommended that you consult with a pharmacist or your primary care physician to ensure this medication is safe for you to take.     If you have a cough, you may use cough suppressants such as Delsym and Robitussin.  If you have glaucoma or high blood pressure, you can also use Coricidin HBP.   For cough I have prescribed for you A prescription cough medication called Tessalon Perles 100 mg. You may take 1-2 capsules every 8 hours as needed for cough  If you have a sore or scratchy throat, use a saltwater gargle-  to  teaspoon of salt dissolved in a 4-ounce to 8-ounce glass of warm water.  Gargle the solution for approximately 15-30 seconds and then spit.  It is important not to swallow the solution.  You can also use throat lozenges/cough drops and Chloraseptic spray to help with throat pain or  discomfort.  Warm or cold liquids can also be helpful in relieving throat pain.  For headache, pain or general discomfort, you can use Ibuprofen or Tylenol as directed.   Some authorities believe that zinc sprays or the use of Echinacea may shorten the course of your symptoms.   HOME CARE Only take medications as instructed by your medical team. Be sure to drink plenty of fluids. Water is fine as well as fruit juices, sodas and electrolyte beverages. You may want to stay away from caffeine or alcohol. If you are nauseated, try taking small sips of liquids. How do you know if you are getting enough fluid? Your urine should  be a pale yellow or almost colorless. Get rest. Taking a steamy shower or using a humidifier may help nasal congestion and ease sore throat pain. You can place a towel over your head and breathe in the steam from hot water coming from a faucet. Using a saline nasal spray works much the same way. Cough drops, hard candies and sore throat lozenges may ease your cough. Avoid close contacts especially the very young and the elderly Cover your mouth if you cough or sneeze Always remember to wash your hands.   GET HELP RIGHT AWAY IF: You develop worsening fever. If your symptoms do not improve within 10 days You develop yellow or green discharge from your nose over 3 days. You have coughing fits You develop a severe head ache or visual changes. You develop shortness of breath, difficulty breathing or start having chest pain Your symptoms persist after you have completed your treatment plan  MAKE SURE YOU  Understand these instructions. Will watch your condition. Will get help right away if you are not doing well or get worse.  Thank you for choosing an e-visit.  Your e-visit answers were reviewed by a board certified advanced clinical practitioner to complete your personal care plan. Depending upon the condition, your plan could have included both over the counter or prescription medications.  Please review your pharmacy choice. Make sure the pharmacy is open so you can pick up prescription now. If there is a problem, you may contact your provider through [employer] and have the prescription routed to another pharmacy.  Your safety is important to [place]. If you have drug allergies check your prescription carefully.   For the next 24 hours you can use MyChart to ask questions about today's visit, request a non-urgent call back, or ask for a work or school excuse. You will get an email in the next two days asking about your experience. I hope that your e-visit has been valuable and will  speed your recovery.

## 2021-09-27 ENCOUNTER — Other Ambulatory Visit: Payer: Self-pay | Admitting: Family Medicine

## 2021-09-27 DIAGNOSIS — G4700 Insomnia, unspecified: Secondary | ICD-10-CM

## 2021-09-27 DIAGNOSIS — K219 Gastro-esophageal reflux disease without esophagitis: Secondary | ICD-10-CM

## 2021-10-27 ENCOUNTER — Other Ambulatory Visit: Payer: Self-pay | Admitting: Family Medicine

## 2021-10-27 DIAGNOSIS — K219 Gastro-esophageal reflux disease without esophagitis: Secondary | ICD-10-CM

## 2021-10-27 DIAGNOSIS — G4700 Insomnia, unspecified: Secondary | ICD-10-CM

## 2021-11-20 ENCOUNTER — Other Ambulatory Visit: Payer: Self-pay | Admitting: Family Medicine

## 2021-11-29 ENCOUNTER — Other Ambulatory Visit: Payer: Self-pay | Admitting: Family Medicine

## 2021-11-29 DIAGNOSIS — I10 Essential (primary) hypertension: Secondary | ICD-10-CM

## 2021-12-20 ENCOUNTER — Other Ambulatory Visit: Payer: Self-pay | Admitting: Family Medicine

## 2021-12-24 ENCOUNTER — Other Ambulatory Visit: Payer: Self-pay | Admitting: Family Medicine

## 2021-12-24 DIAGNOSIS — G4700 Insomnia, unspecified: Secondary | ICD-10-CM

## 2021-12-24 DIAGNOSIS — K219 Gastro-esophageal reflux disease without esophagitis: Secondary | ICD-10-CM

## 2021-12-24 DIAGNOSIS — I10 Essential (primary) hypertension: Secondary | ICD-10-CM

## 2021-12-29 ENCOUNTER — Other Ambulatory Visit: Payer: Self-pay | Admitting: Family Medicine

## 2021-12-29 DIAGNOSIS — I10 Essential (primary) hypertension: Secondary | ICD-10-CM

## 2022-05-05 ENCOUNTER — Ambulatory Visit: Payer: 59 | Admitting: Family Medicine

## 2022-05-13 IMAGING — CT CT ABD-PELV W/ CM
2 of 5 series · 15 of 46 positions shown, 17 images · IV contrast (omnipaque)
Comparison: GI bleeding study earlier in the same day.

CLINICAL DATA: GI bleed.

EXAM:
CT ABDOMEN AND PELVIS WITH CONTRAST
TECHNIQUE: Multidetector CT imaging of the abdomen and pelvis was performed
using the standard protocol following bolus administration of
intravenous contrast.
CONTRAST:  100mL OMNIPAQUE IOHEXOL 300 MG/ML  SOLN

[Series 3: a/p w/ 5mm · axial · 0.95mm/px · z∈[+646,+1071]mm · 12 of 97 slices shown, 14 images]
[im 6/97  soft-tissue]
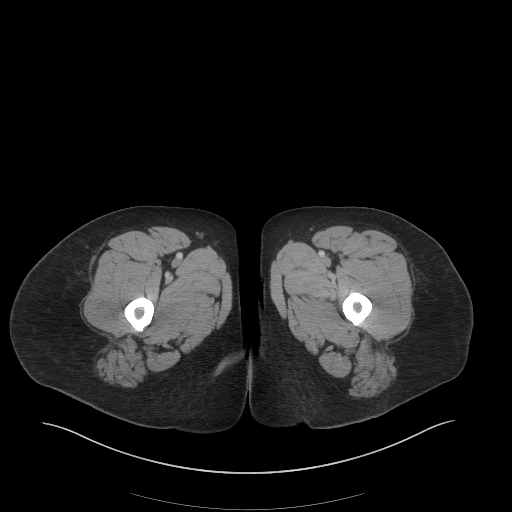
[im 6/97  bone]
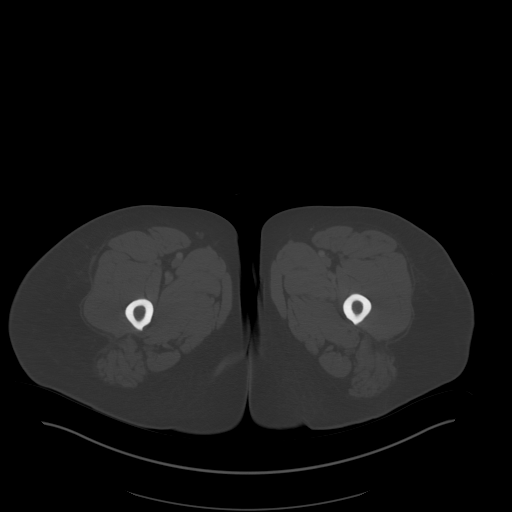
[im 17/97  soft-tissue]
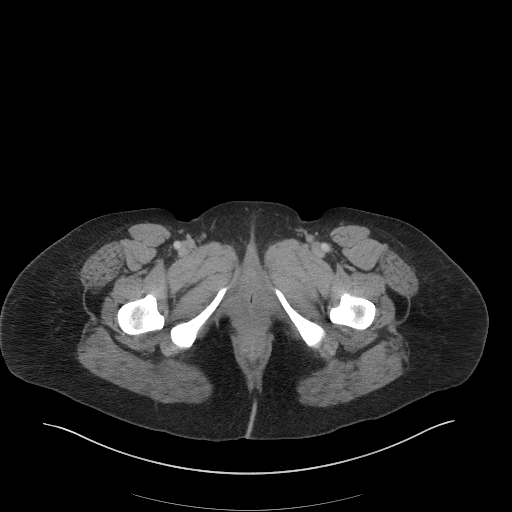
[im 23/97  soft-tissue]
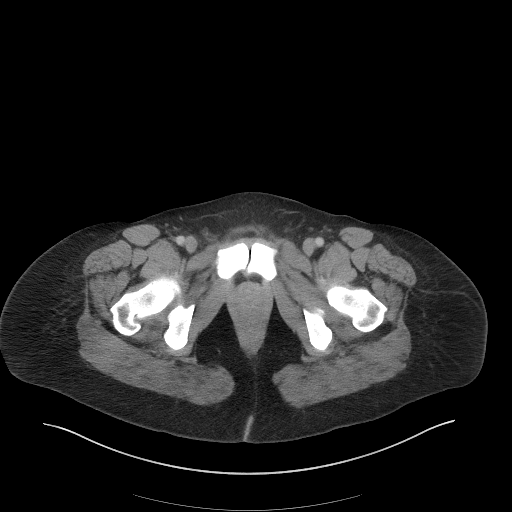
[im 29/97  soft-tissue]
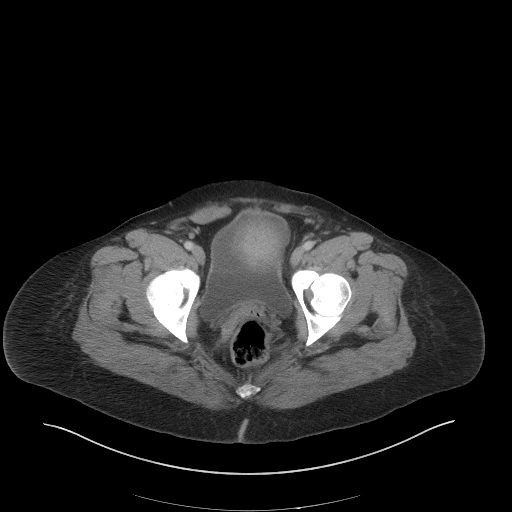
[im 40/97  soft-tissue]
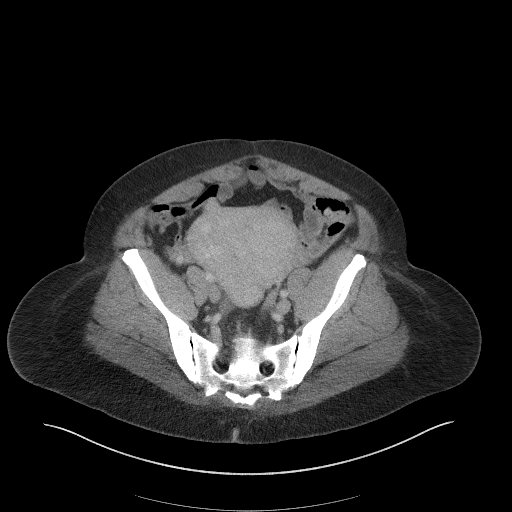
[im 46/97  soft-tissue]
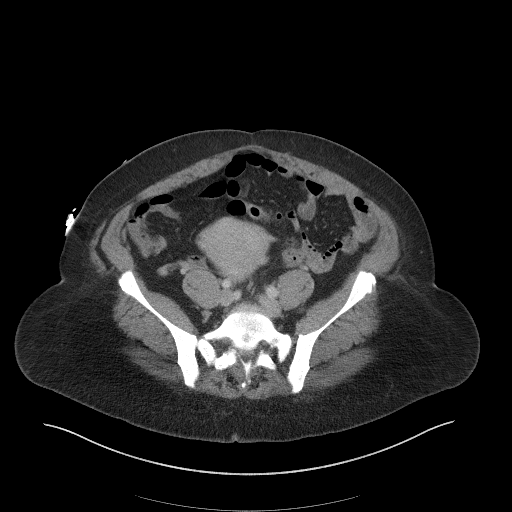
[im 51/97  soft-tissue]
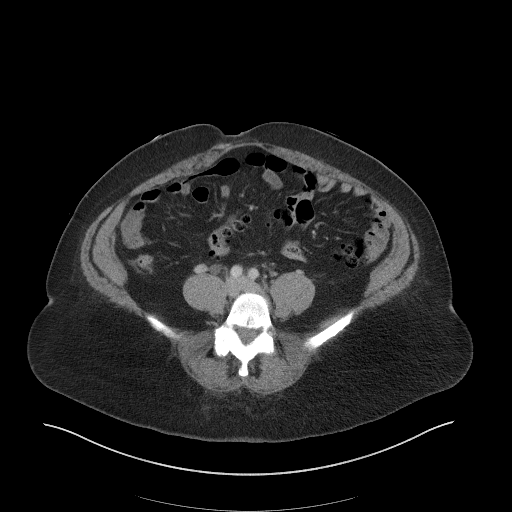
[im 63/97  soft-tissue]
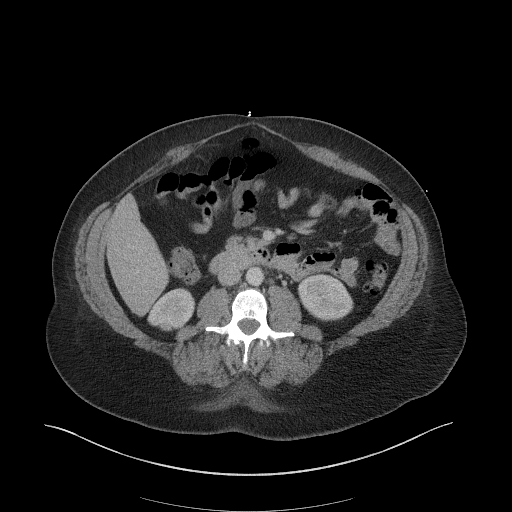
[im 68/97  soft-tissue]
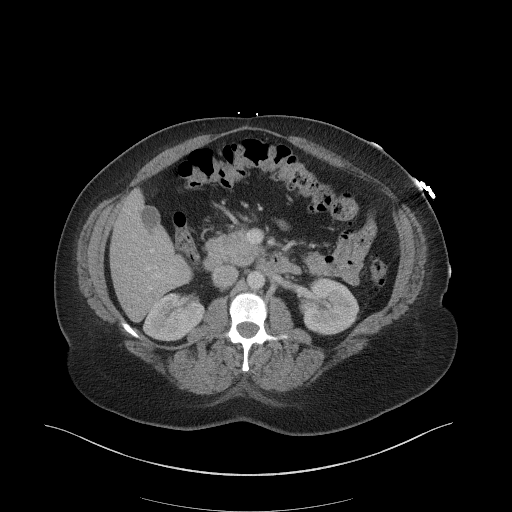
[im 68/97  bone]
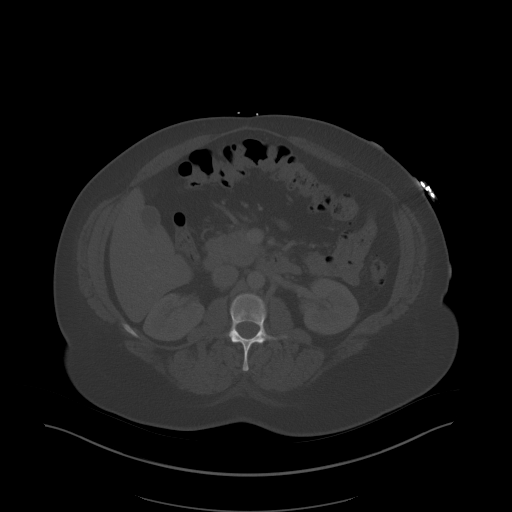
[im 74/97  soft-tissue]
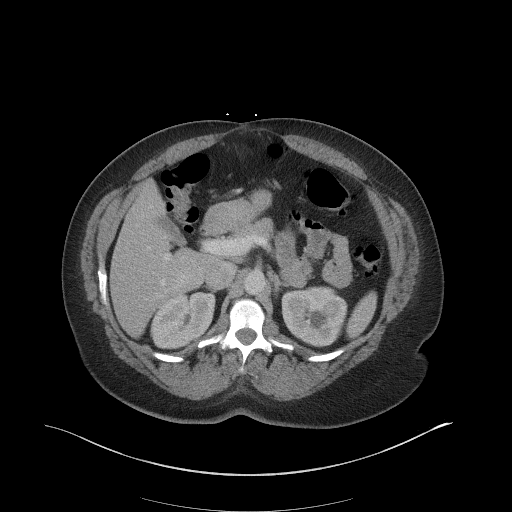
[im 85/97  soft-tissue]
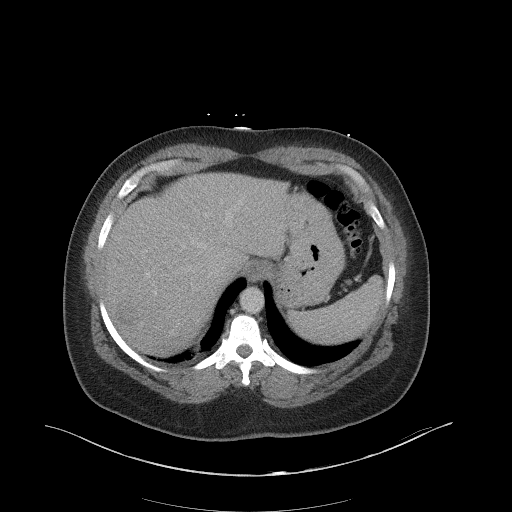
[im 91/97  soft-tissue]
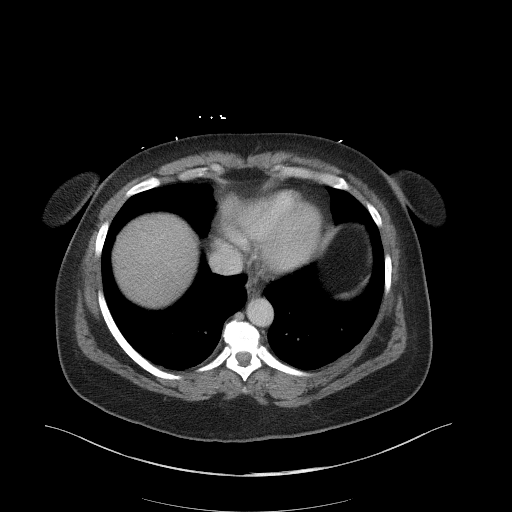

[Series 6: a/p w/ cor · coronal · 0.74mm/px · 3 of 156 slices shown]
[im 52/156  soft-tissue]
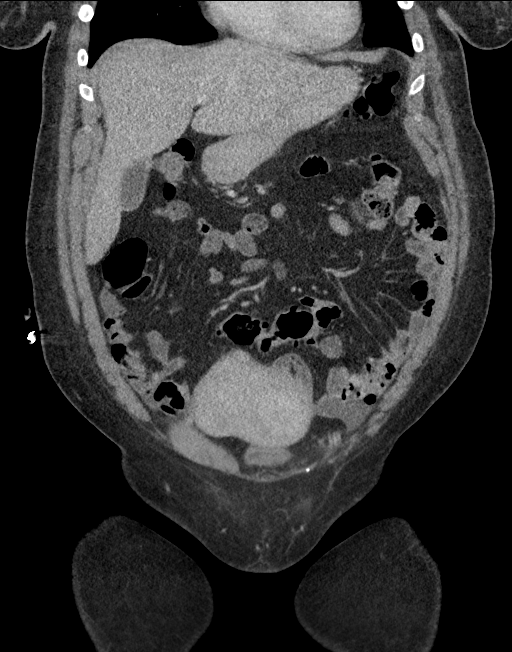
[im 69/156  soft-tissue]
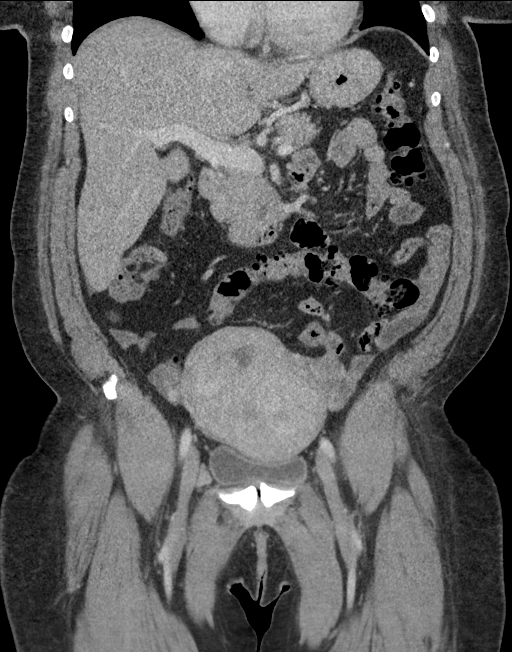
[im 87/156  soft-tissue]
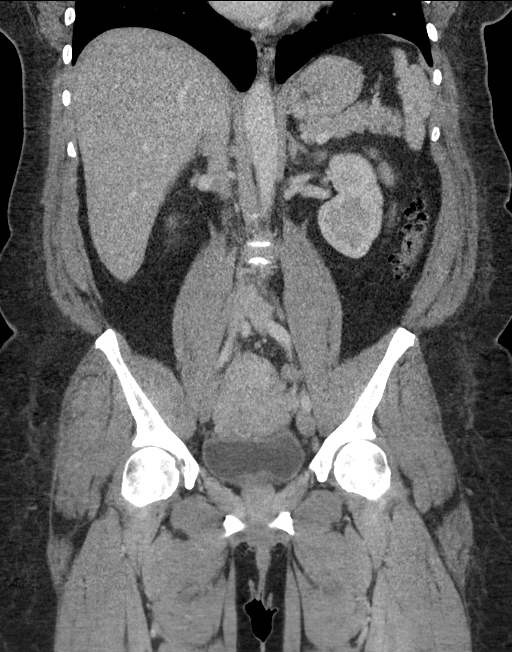

[15 of 46 positions shown; findings below may reference images not displayed]

FINDINGS: Lower chest: There is a trace right-sided pleural effusion.The heart
size is normal.

Hepatobiliary: There is a hypoattenuating 2.8 cm mass in the right
hepatic lobe that appears to fill in on the delayed phase and as
such is favored to represent a hepatic hemangioma. However, the
lesion is not fully visualized on the delayed phase and therefore
this lesion is incompletely characterized normal gallbladder.There
is no biliary ductal dilation.

Pancreas: Normal contours without ductal dilatation. No
peripancreatic fluid collection.

Spleen: Unremarkable.

Adrenals/Urinary Tract:

--Adrenal glands: Unremarkable.

--Right kidney/ureter: No hydronephrosis or radiopaque kidney
stones.

--Left kidney/ureter: No hydronephrosis or radiopaque kidney stones.

--Urinary bladder: Unremarkable.

Stomach/Bowel:

--Stomach/Duodenum: No hiatal hernia or other gastric abnormality.
Normal duodenal course and caliber.

--Small bowel: Unremarkable.

--Colon: Rectosigmoid diverticulosis without acute inflammation.

--Appendix: Normal.

Vascular/Lymphatic: Atherosclerotic calcification is present within
the non-aneurysmal abdominal aorta, without hemodynamically
significant stenosis.

--No retroperitoneal lymphadenopathy.

--No mesenteric lymphadenopathy.

--No pelvic or inguinal lymphadenopathy.

Reproductive: The uterus is enlarged with multiple fibroids.

Other: No ascites or free air. The abdominal wall is normal.

Musculoskeletal. No acute displaced fractures.
IMPRESSION: 1. Rectosigmoid diverticulosis without acute inflammation.
2. Fibroid uterus. This likely explains the findings on the
patient's recent GI bleeding study.
3. Trace right-sided pleural effusion.
4. Probable hepatic hemangioma in the right hepatic lobe. As this
was not fully assessed on this examination, an outpatient contrast
enhanced multiphase MRI is recommended for further evaluation.

Aortic Atherosclerosis ([id number]).

## 2022-11-20 IMAGING — DX DG CHEST 2V
2 series · 2 of 2 positions shown · non-contrast
Comparison: May 26, 2020.

CLINICAL DATA: Coarse lung sounds.

EXAM:
CHEST - 2 VIEW

[chest pa]
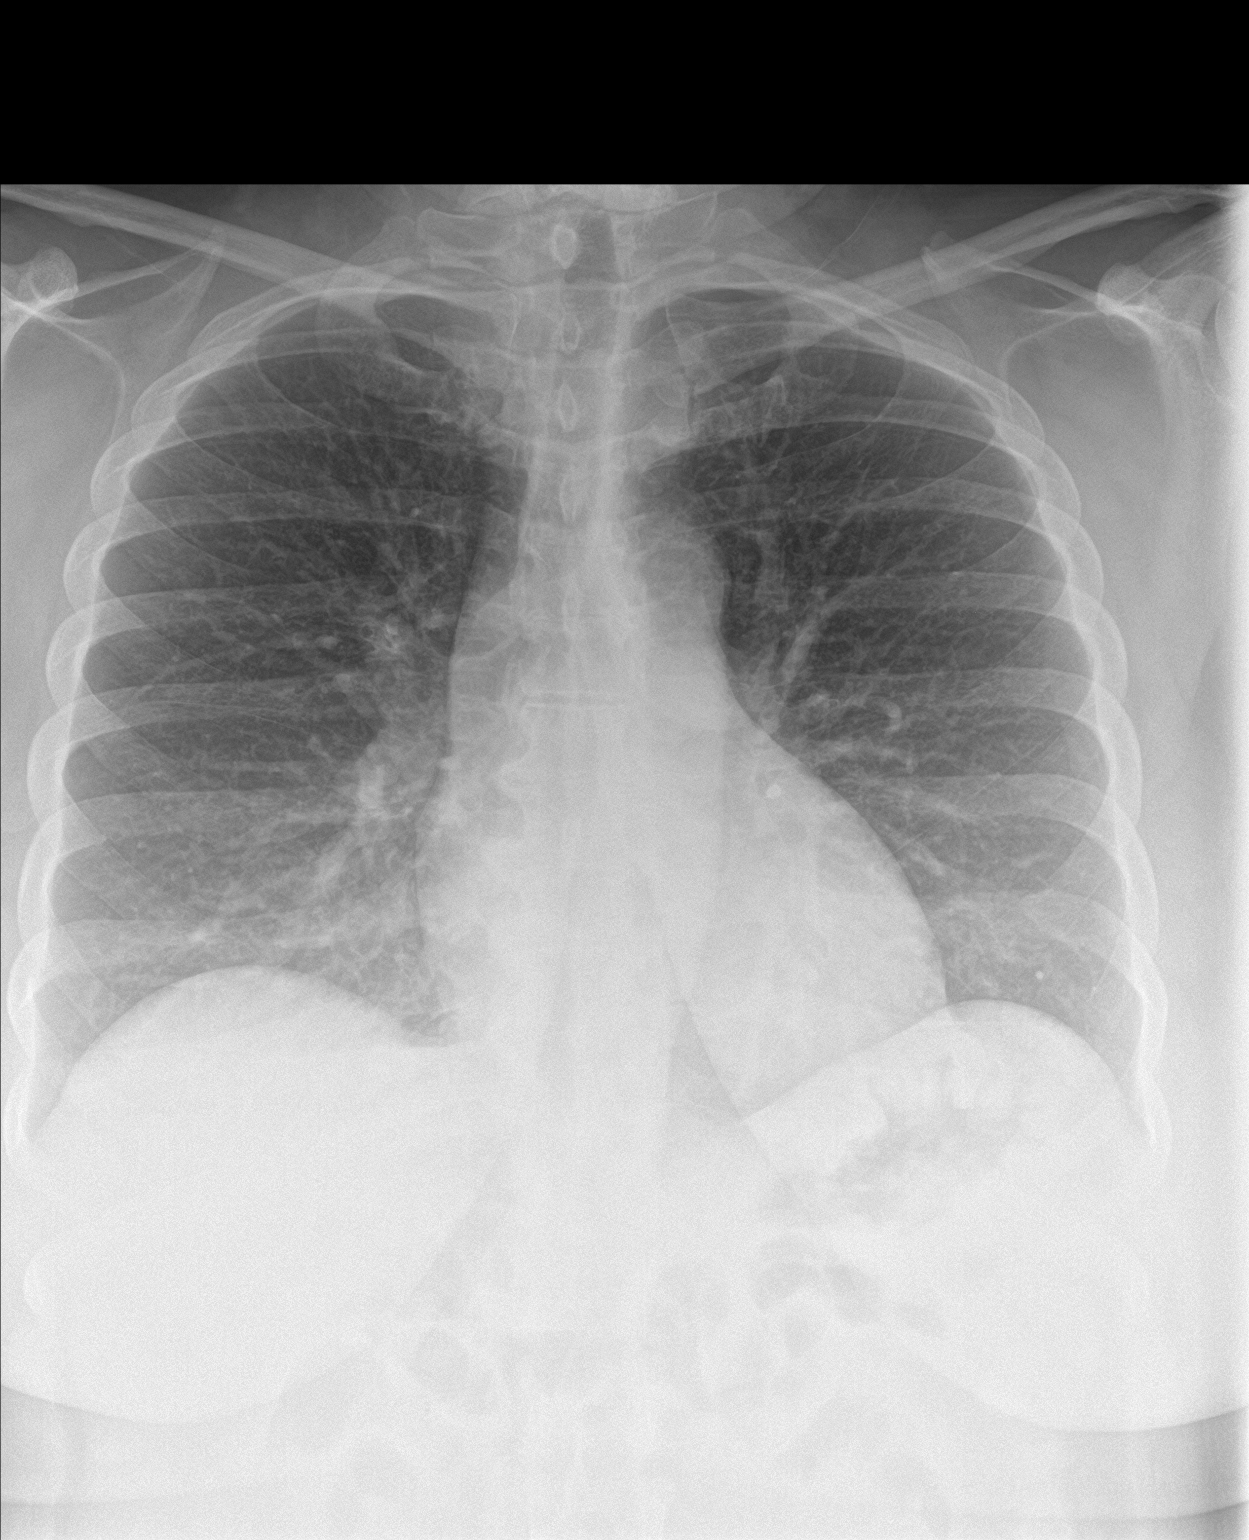

[chest lat]
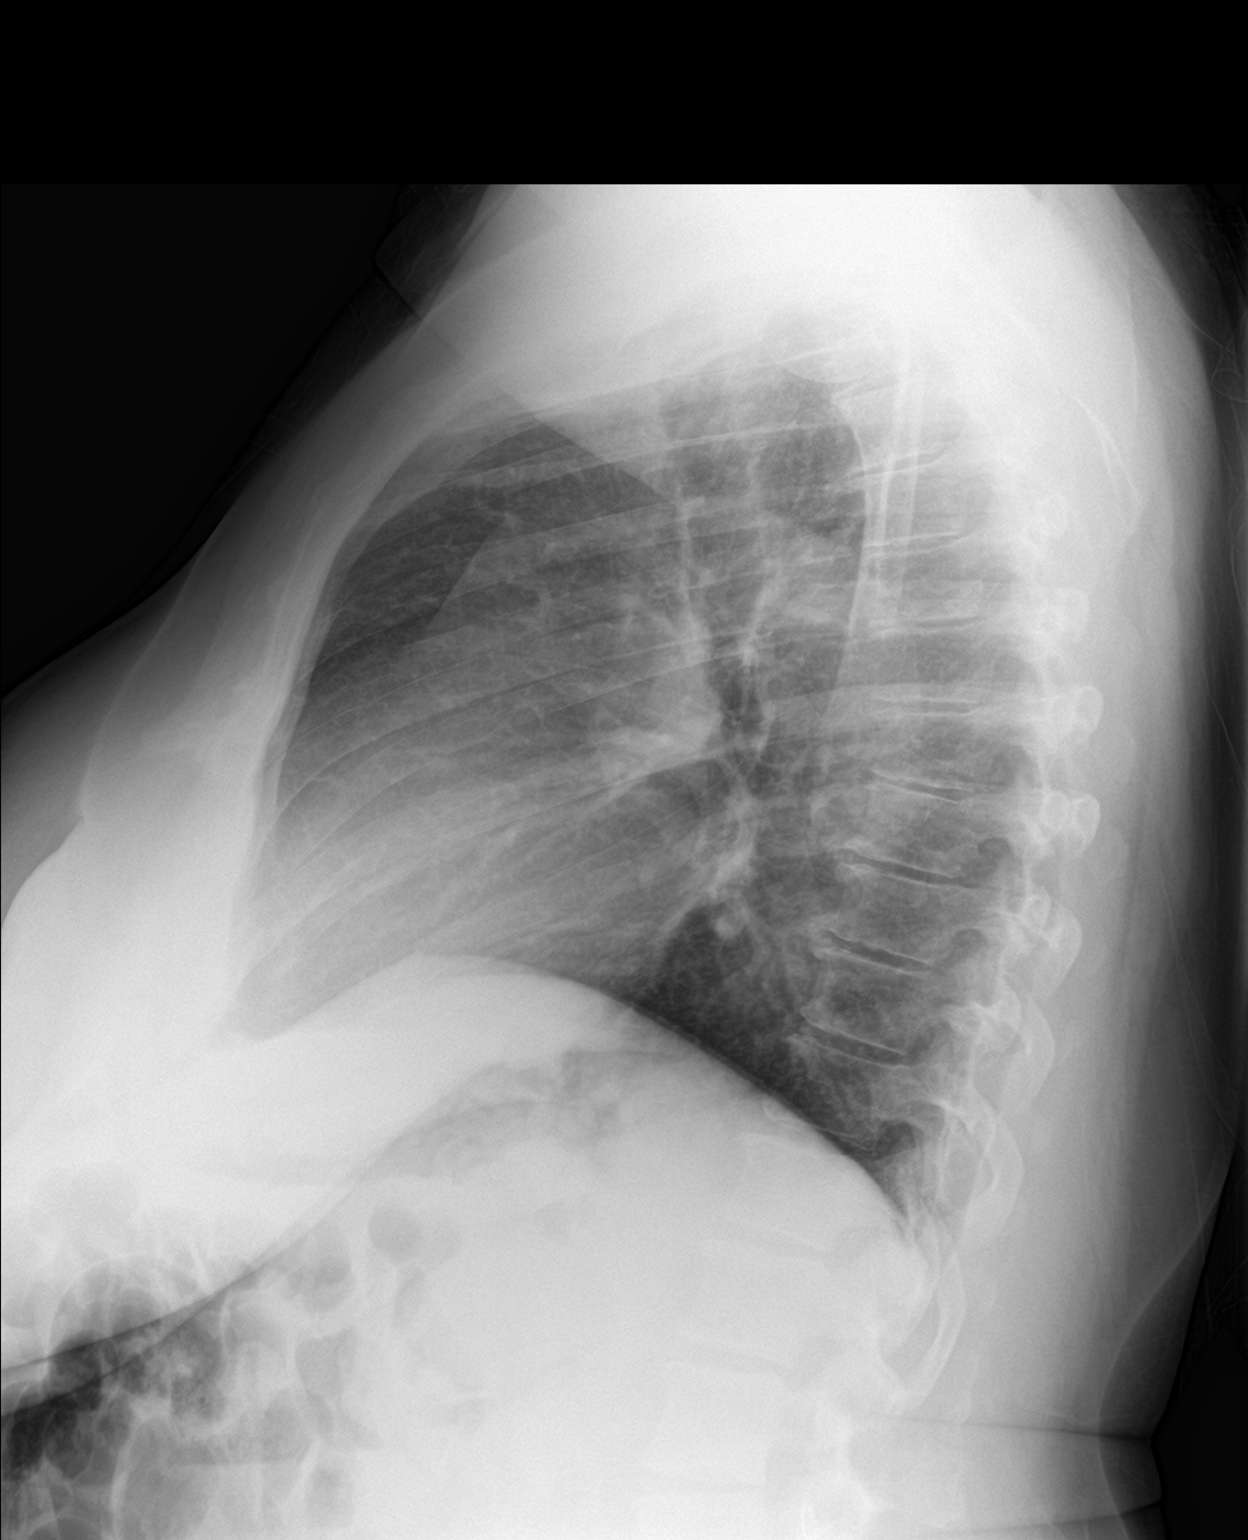

[2 of 2 positions shown; findings below may reference images not displayed]

FINDINGS: The heart size and mediastinal contours are within normal limits.
Both lungs are clear. The visualized skeletal structures are
unremarkable.
IMPRESSION: No active cardiopulmonary disease.

## 2023-06-26 ENCOUNTER — Inpatient Hospital Stay (HOSPITAL_COMMUNITY)
Admission: EM | Admit: 2023-06-26 | Discharge: 2023-06-29 | DRG: 062 | Disposition: A | Discharge status: Home / Self Care | Payer: Self-pay | Attending: Student in an Organized Health Care Education/Training Program | Admitting: Student in an Organized Health Care Education/Training Program

## 2023-06-26 ENCOUNTER — Emergency Department (HOSPITAL_COMMUNITY): Payer: Self-pay

## 2023-06-26 ENCOUNTER — Encounter (HOSPITAL_COMMUNITY): Payer: Self-pay | Admitting: Student in an Organized Health Care Education/Training Program

## 2023-06-26 ENCOUNTER — Inpatient Hospital Stay (HOSPITAL_COMMUNITY): Payer: Self-pay

## 2023-06-26 ENCOUNTER — Other Ambulatory Visit: Payer: Self-pay

## 2023-06-26 DIAGNOSIS — Z793 Long term (current) use of hormonal contraceptives: Secondary | ICD-10-CM

## 2023-06-26 DIAGNOSIS — I43 Cardiomyopathy in diseases classified elsewhere: Secondary | ICD-10-CM | POA: Diagnosis present

## 2023-06-26 DIAGNOSIS — G4700 Insomnia, unspecified: Secondary | ICD-10-CM | POA: Diagnosis present

## 2023-06-26 DIAGNOSIS — R29810 Facial weakness: Secondary | ICD-10-CM | POA: Diagnosis present

## 2023-06-26 DIAGNOSIS — F419 Anxiety disorder, unspecified: Secondary | ICD-10-CM | POA: Diagnosis present

## 2023-06-26 DIAGNOSIS — K219 Gastro-esophageal reflux disease without esophagitis: Secondary | ICD-10-CM | POA: Diagnosis present

## 2023-06-26 DIAGNOSIS — I161 Hypertensive emergency: Secondary | ICD-10-CM | POA: Diagnosis present

## 2023-06-26 DIAGNOSIS — I081 Rheumatic disorders of both mitral and tricuspid valves: Secondary | ICD-10-CM | POA: Diagnosis present

## 2023-06-26 DIAGNOSIS — E669 Obesity, unspecified: Secondary | ICD-10-CM | POA: Diagnosis present

## 2023-06-26 DIAGNOSIS — G8191 Hemiplegia, unspecified affecting right dominant side: Secondary | ICD-10-CM | POA: Diagnosis present

## 2023-06-26 DIAGNOSIS — I119 Hypertensive heart disease without heart failure: Secondary | ICD-10-CM | POA: Diagnosis present

## 2023-06-26 DIAGNOSIS — F1090 Alcohol use, unspecified, uncomplicated: Secondary | ICD-10-CM | POA: Diagnosis present

## 2023-06-26 DIAGNOSIS — Y903 Blood alcohol level of 60-79 mg/100 ml: Secondary | ICD-10-CM | POA: Diagnosis present

## 2023-06-26 DIAGNOSIS — Z7141 Alcohol abuse counseling and surveillance of alcoholic: Secondary | ICD-10-CM

## 2023-06-26 DIAGNOSIS — R4701 Aphasia: Secondary | ICD-10-CM | POA: Diagnosis present

## 2023-06-26 DIAGNOSIS — Z91148 Patient's other noncompliance with medication regimen for other reason: Secondary | ICD-10-CM

## 2023-06-26 DIAGNOSIS — F17210 Nicotine dependence, cigarettes, uncomplicated: Secondary | ICD-10-CM | POA: Diagnosis present

## 2023-06-26 DIAGNOSIS — Z7982 Long term (current) use of aspirin: Secondary | ICD-10-CM

## 2023-06-26 DIAGNOSIS — Z8249 Family history of ischemic heart disease and other diseases of the circulatory system: Secondary | ICD-10-CM

## 2023-06-26 DIAGNOSIS — Z6838 Body mass index (BMI) 38.0-38.9, adult: Secondary | ICD-10-CM

## 2023-06-26 DIAGNOSIS — R4781 Slurred speech: Secondary | ICD-10-CM | POA: Diagnosis present

## 2023-06-26 DIAGNOSIS — Z818 Family history of other mental and behavioral disorders: Secondary | ICD-10-CM

## 2023-06-26 DIAGNOSIS — R29712 NIHSS score 12: Secondary | ICD-10-CM | POA: Diagnosis present

## 2023-06-26 DIAGNOSIS — E785 Hyperlipidemia, unspecified: Secondary | ICD-10-CM | POA: Diagnosis present

## 2023-06-26 DIAGNOSIS — Z7952 Long term (current) use of systemic steroids: Secondary | ICD-10-CM

## 2023-06-26 DIAGNOSIS — I2510 Atherosclerotic heart disease of native coronary artery without angina pectoris: Secondary | ICD-10-CM | POA: Diagnosis present

## 2023-06-26 DIAGNOSIS — R471 Dysarthria and anarthria: Secondary | ICD-10-CM | POA: Diagnosis present

## 2023-06-26 DIAGNOSIS — Z79899 Other long term (current) drug therapy: Secondary | ICD-10-CM

## 2023-06-26 DIAGNOSIS — Z823 Family history of stroke: Secondary | ICD-10-CM

## 2023-06-26 DIAGNOSIS — I6389 Other cerebral infarction: Secondary | ICD-10-CM

## 2023-06-26 DIAGNOSIS — Z597 Insufficient social insurance and welfare support: Secondary | ICD-10-CM

## 2023-06-26 DIAGNOSIS — Z713 Dietary counseling and surveillance: Secondary | ICD-10-CM

## 2023-06-26 DIAGNOSIS — Z716 Tobacco abuse counseling: Secondary | ICD-10-CM

## 2023-06-26 DIAGNOSIS — F32A Depression, unspecified: Secondary | ICD-10-CM | POA: Diagnosis present

## 2023-06-26 DIAGNOSIS — I639 Cerebral infarction, unspecified: Secondary | ICD-10-CM

## 2023-06-26 DIAGNOSIS — I63512 Cerebral infarction due to unspecified occlusion or stenosis of left middle cerebral artery: Principal | ICD-10-CM | POA: Diagnosis present

## 2023-06-26 DIAGNOSIS — I4891 Unspecified atrial fibrillation: Secondary | ICD-10-CM

## 2023-06-26 DIAGNOSIS — Z635 Disruption of family by separation and divorce: Secondary | ICD-10-CM

## 2023-06-26 DIAGNOSIS — Z8673 Personal history of transient ischemic attack (TIA), and cerebral infarction without residual deficits: Secondary | ICD-10-CM

## 2023-06-26 LAB — ECHOCARDIOGRAM COMPLETE BUBBLE STUDY
AR max vel: 2.02 cm2
AV Peak grad: 10.5 mmHg
Ao pk vel: 1.62 m/s
Area-P 1/2: 4.06 cm2
Calc EF: 64.4 %
Height: 60 in
S' Lateral: 2.8 cm
Single Plane A2C EF: 64.9 %
Single Plane A4C EF: 63.4 %
Weight: 3139.35 oz

## 2023-06-26 LAB — DIFFERENTIAL
Abs Immature Granulocytes: 0.01 10*3/uL (ref 0.00–0.07)
Basophils Absolute: 0 10*3/uL (ref 0.0–0.1)
Basophils Relative: 1 %
Eosinophils Absolute: 0.1 10*3/uL (ref 0.0–0.5)
Eosinophils Relative: 2 %
Immature Granulocytes: 0 %
Lymphocytes Relative: 28 %
Lymphs Abs: 1.8 10*3/uL (ref 0.7–4.0)
Monocytes Absolute: 0.6 10*3/uL (ref 0.1–1.0)
Monocytes Relative: 9 %
Neutro Abs: 3.7 10*3/uL (ref 1.7–7.7)
Neutrophils Relative %: 60 %

## 2023-06-26 LAB — I-STAT CHEM 8, ED
BUN: 12 mg/dL (ref 6–20)
Calcium, Ion: 1.08 mmol/L — ABNORMAL LOW (ref 1.15–1.40)
Chloride: 106 mmol/L (ref 98–111)
Creatinine, Ser: 0.9 mg/dL (ref 0.44–1.00)
Glucose, Bld: 168 mg/dL — ABNORMAL HIGH (ref 70–99)
HCT: 40 % (ref 36.0–46.0)
Hemoglobin: 13.6 g/dL (ref 12.0–15.0)
Potassium: 4.2 mmol/L (ref 3.5–5.1)
Sodium: 142 mmol/L (ref 135–145)
TCO2: 23 mmol/L (ref 22–32)

## 2023-06-26 LAB — RAPID URINE DRUG SCREEN, HOSP PERFORMED
Amphetamines: NOT DETECTED
Barbiturates: NOT DETECTED
Benzodiazepines: NOT DETECTED
Cocaine: NOT DETECTED
Opiates: NOT DETECTED
Tetrahydrocannabinol: POSITIVE — AB

## 2023-06-26 LAB — HIV ANTIBODY (ROUTINE TESTING W REFLEX): HIV Screen 4th Generation wRfx: NONREACTIVE

## 2023-06-26 LAB — PROTIME-INR
INR: 0.9 (ref 0.8–1.2)
Prothrombin Time: 12.8 seconds (ref 11.4–15.2)

## 2023-06-26 LAB — LIPID PANEL
Cholesterol: 190 mg/dL (ref 0–200)
HDL: 63 mg/dL (ref >40)
LDL Cholesterol: 98 mg/dL (ref 0–99)
Total CHOL/HDL Ratio: 3 RATIO
Triglycerides: 147 mg/dL (ref <150)
VLDL: 29 mg/dL (ref 0–40)

## 2023-06-26 LAB — COMPREHENSIVE METABOLIC PANEL
ALT: 20 U/L (ref 0–44)
AST: 31 U/L (ref 15–41)
Albumin: 3.8 g/dL (ref 3.5–5.0)
Alkaline Phosphatase: 67 U/L (ref 38–126)
Anion gap: 11 (ref 5–15)
BUN: 11 mg/dL (ref 6–20)
CO2: 22 mmol/L (ref 22–32)
Calcium: 8.4 mg/dL — ABNORMAL LOW (ref 8.9–10.3)
Chloride: 106 mmol/L (ref 98–111)
Creatinine, Ser: 0.85 mg/dL (ref 0.44–1.00)
GFR, Estimated: >60 mL/min (ref >60)
Glucose, Bld: 174 mg/dL — ABNORMAL HIGH (ref 70–99)
Potassium: 4.3 mmol/L (ref 3.5–5.1)
Sodium: 139 mmol/L (ref 135–145)
Total Bilirubin: 0.3 mg/dL (ref 0.3–1.2)
Total Protein: 6.8 g/dL (ref 6.5–8.1)

## 2023-06-26 LAB — CBC
HCT: 40 % (ref 36.0–46.0)
Hemoglobin: 12.3 g/dL (ref 12.0–15.0)
MCH: 25.3 pg — ABNORMAL LOW (ref 26.0–34.0)
MCHC: 30.8 g/dL (ref 30.0–36.0)
MCV: 82.1 fL (ref 80.0–100.0)
Platelets: 312 10*3/uL (ref 150–400)
RBC: 4.87 MIL/uL (ref 3.87–5.11)
RDW: 14.7 % (ref 11.5–15.5)
WBC: 6.2 10*3/uL (ref 4.0–10.5)
nRBC: 0 % (ref 0.0–0.2)

## 2023-06-26 LAB — CBG MONITORING, ED: Glucose-Capillary: 150 mg/dL — ABNORMAL HIGH (ref 70–99)

## 2023-06-26 LAB — HEMOGLOBIN A1C
Hgb A1c MFr Bld: 6.1 % — ABNORMAL HIGH (ref 4.8–5.6)
Mean Plasma Glucose: 128.37 mg/dL

## 2023-06-26 LAB — HCG, SERUM, QUALITATIVE: Preg, Serum: NEGATIVE

## 2023-06-26 LAB — MRSA NEXT GEN BY PCR, NASAL: MRSA by PCR Next Gen: NOT DETECTED

## 2023-06-26 LAB — HCG, QUANTITATIVE, PREGNANCY: hCG, Beta Chain, Quant, S: 1 m[IU]/mL (ref <5)

## 2023-06-26 LAB — APTT: aPTT: 21 seconds — ABNORMAL LOW (ref 24–36)

## 2023-06-26 LAB — ETHANOL: Alcohol, Ethyl (B): 64 mg/dL — ABNORMAL HIGH (ref <10)

## 2023-06-26 LAB — GLUCOSE, CAPILLARY: Glucose-Capillary: 115 mg/dL — ABNORMAL HIGH (ref 70–99)

## 2023-06-26 MED ORDER — CLEVIDIPINE BUTYRATE 0.5 MG/ML IV EMUL
0.0000 mg/h | INTRAVENOUS | Status: DC
  Filled 2023-06-26: qty 100

## 2023-06-26 MED ORDER — STROKE: EARLY STAGES OF RECOVERY BOOK
Freq: Once | Status: AC
  Filled 2023-06-26: qty 1

## 2023-06-26 MED ORDER — FOLIC ACID 1 MG PO TABS
1.0000 mg | ORAL_TABLET | Freq: Every day | ORAL | Status: DC
  Administered 2023-06-26 – 2023-06-29 (×4): 1 mg via ORAL
  Filled 2023-06-26 (×4): qty 1

## 2023-06-26 MED ORDER — IOHEXOL 350 MG/ML SOLN
75.0000 mL | Freq: Once | INTRAVENOUS | Status: AC | PRN
  Administered 2023-06-26: 75 mL via INTRAVENOUS

## 2023-06-26 MED ORDER — LABETALOL HCL 5 MG/ML IV SOLN: 20.0000 mg | Freq: Once | INTRAVENOUS | Status: DC

## 2023-06-26 MED ORDER — LABETALOL HCL 5 MG/ML IV SOLN
20.0000 mg | Freq: Once | INTRAVENOUS | Status: AC
  Administered 2023-06-26: 10 mg via INTRAVENOUS

## 2023-06-26 MED ORDER — ACETAMINOPHEN 160 MG/5ML PO SOLN: 650.0000 mg | ORAL | Status: DC | PRN

## 2023-06-26 MED ORDER — PANTOPRAZOLE SODIUM 40 MG IV SOLR
40.0000 mg | Freq: Every day | INTRAVENOUS | Status: DC
  Administered 2023-06-26 – 2023-06-27 (×2): 40 mg via INTRAVENOUS
  Filled 2023-06-26 (×2): qty 10

## 2023-06-26 MED ORDER — ATORVASTATIN CALCIUM 40 MG PO TABS
40.0000 mg | ORAL_TABLET | Freq: Every day | ORAL | Status: DC
  Administered 2023-06-26 – 2023-06-28 (×3): 40 mg via ORAL
  Filled 2023-06-26 (×4): qty 1

## 2023-06-26 MED ORDER — ACETAMINOPHEN 650 MG RE SUPP: 650.0000 mg | RECTAL | Status: DC | PRN

## 2023-06-26 MED ORDER — LORAZEPAM 2 MG/ML IJ SOLN
1.0000 mg | Freq: Once | INTRAMUSCULAR | Status: AC | PRN
  Administered 2023-06-26: 1 mg via INTRAVENOUS
  Filled 2023-06-26: qty 1

## 2023-06-26 MED ORDER — ADULT MULTIVITAMIN W/MINERALS CH
1.0000 | ORAL_TABLET | Freq: Every day | ORAL | Status: DC
  Administered 2023-06-26 – 2023-06-29 (×4): 1 via ORAL
  Filled 2023-06-26 (×4): qty 1

## 2023-06-26 MED ORDER — ACETAMINOPHEN 325 MG PO TABS
650.0000 mg | ORAL_TABLET | ORAL | Status: DC | PRN
  Administered 2023-06-26 – 2023-06-29 (×9): 650 mg via ORAL
  Filled 2023-06-26 (×9): qty 2

## 2023-06-26 MED ORDER — CHLORHEXIDINE GLUCONATE CLOTH 2 % EX PADS
6.0000 | MEDICATED_PAD | Freq: Every day | CUTANEOUS | Status: DC
  Administered 2023-06-27 – 2023-06-29 (×3): 6 via TOPICAL

## 2023-06-26 MED ORDER — ATORVASTATIN CALCIUM 10 MG PO TABS: 20.0000 mg | ORAL_TABLET | Freq: Every day | ORAL | Status: DC

## 2023-06-26 MED ORDER — SODIUM CHLORIDE 0.9% FLUSH: 3.0000 mL | Freq: Once | INTRAVENOUS | Status: DC

## 2023-06-26 MED ORDER — CLEVIDIPINE BUTYRATE 0.5 MG/ML IV EMUL: 0.0000 mg/h | INTRAVENOUS | Status: DC

## 2023-06-26 MED ORDER — SENNOSIDES-DOCUSATE SODIUM 8.6-50 MG PO TABS
1.0000 | ORAL_TABLET | Freq: Every evening | ORAL | Status: DC | PRN
  Filled 2023-06-26: qty 1

## 2023-06-26 MED ORDER — NICOTINE 14 MG/24HR TD PT24
14.0000 mg | MEDICATED_PATCH | Freq: Every day | TRANSDERMAL | Status: DC | PRN
  Administered 2023-06-29: 14 mg via TRANSDERMAL
  Filled 2023-06-26: qty 1

## 2023-06-26 MED ORDER — LABETALOL HCL 5 MG/ML IV SOLN
INTRAVENOUS | Status: AC
  Filled 2023-06-26: qty 4

## 2023-06-26 MED ORDER — THIAMINE MONONITRATE 100 MG PO TABS
100.0000 mg | ORAL_TABLET | Freq: Every day | ORAL | Status: DC
  Administered 2023-06-26 – 2023-06-29 (×4): 100 mg via ORAL
  Filled 2023-06-26 (×4): qty 1

## 2023-06-26 MED ORDER — SODIUM CHLORIDE 0.9 % IV SOLN: INTRAVENOUS | Status: DC

## 2023-06-26 MED ORDER — TRAZODONE HCL 50 MG PO TABS
50.0000 mg | ORAL_TABLET | Freq: Every evening | ORAL | Status: DC | PRN
  Administered 2023-06-26 – 2023-06-28 (×3): 50 mg via ORAL
  Filled 2023-06-26 (×3): qty 1

## 2023-06-26 MED ORDER — TENECTEPLASE FOR STROKE
0.2500 mg/kg | PACK | Freq: Once | INTRAVENOUS | Status: AC
  Administered 2023-06-26: 22 mg via INTRAVENOUS
  Filled 2023-06-26: qty 10

## 2023-06-26 MED ORDER — ORAL CARE MOUTH RINSE: 15.0000 mL | OROMUCOSAL | Status: DC | PRN

## 2023-06-26 NOTE — ED Notes (Signed)
Patient transported to MRI by this RN  

## 2023-06-26 NOTE — Evaluation (Addendum)
Physical Therapy Evaluation Patient Details Name: Taylor Black MRN: [id number] DOB: November 08, 1970 Today's Date: 06/26/2023  History of Present Illness  Pt is 52 yo presenting via EMS to [facility] due to R sided weakness and dysarthria. 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  Pt was given TNKase and admitted. PMH: depression, HTN, Hypertensive cardiomyopathy, nonobstructive CAD.  Clinical Impression  Pt is presenting at independent level for all functional mobility at this time. No significant strength or coordination deficits to alter sit to stand, gait or bed mobility. 4/5 strength in RUE compared to LUE and slight L facial droop noted.  Due to current level of function no recommended skilled physical therapy services at this time and once pt is medically stable cleared from a PT perspective for safe return home with functional mobility. Pt will be discharged from skilled physical therapy services at this time. Please re-consult if further needs arise.       Equipment Recommendations None recommended by PT     Functional Status Assessment Patient has not had a recent decline in their functional status     Precautions / Restrictions Precautions Precautions: None Restrictions Weight Bearing Restrictions: No      Mobility  Bed Mobility Overal bed mobility: Independent      Transfers Overall transfer level: Independent Equipment used: None      Ambulation/Gait Ambulation/Gait assistance: Independent Gait Distance (Feet): 5 Feet Assistive device: None Gait Pattern/deviations: WFL(Within Functional Limits)   Gait velocity interpretation: >2.62 ft/sec, indicative of community ambulatory   General Gait Details: to bedside commode  Stairs Stairs:  (demonstrates strength and coordinationt through sit to stand and gait to perform steps per home set up without difficulty)            Modified Rankin (Stroke Patients Only) Modified  Rankin (Stroke Patients Only) Pre-Morbid Rankin Score: No symptoms Modified Rankin: No significant disability     Balance Overall balance assessment: Independent           Pertinent Vitals/Pain Pain Assessment Pain Assessment: 0-10 Pain Score: 3  Pain Location: L knee Pain Descriptors / Indicators: Aching Pain Intervention(s): Monitored during session, Premedicated before session    Home Living Family/patient expects to be discharged to:: Private residence Living Arrangements: Children (daughter and 7 and 30 yo grand kids) Available Help at Discharge: Family;Available PRN/intermittently Type of Home: Mobile home Home Access: Stairs to enter Entrance Stairs-Rails: Left Entrance Stairs-Number of Steps: 4   Home Layout: One level Home Equipment: None Additional Comments: 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    Prior Function Prior Level of Function : Independent/Modified Independent;Driving;Working/employed             Mobility Comments: Ambulatory without AD ADLs Comments: Independent     Extremity/Trunk Assessment   Upper Extremity Assessment Upper Extremity Assessment: RUE deficits/detail RUE Deficits / Details: 4/5 global strength in RUE, 5/5 in LUE    Lower Extremity Assessment Lower Extremity Assessment: Overall WFL for tasks assessed    Cervical / Trunk Assessment Cervical / Trunk Assessment: Normal  Communication   Communication Communication: No apparent difficulties  Cognition Arousal: Alert Behavior During Therapy: WFL for tasks assessed/performed Overall Cognitive Status: Within Functional Limits for tasks assessed          General Comments General comments (skin integrity, edema, etc.): Skin warm, dry and intact        Assessment/Plan    PT Assessment Patient does not need any further PT services  PT Goals (Current goals can be found in the Care Plan section)  Acute Rehab PT Goals PT Goal Formulation: All assessment and  education complete, DC therapy     AM-PAC PT "6 Clicks" Mobility  Outcome Measure Help needed turning from your back to your side while in a flat bed without using bedrails?: None Help needed moving from lying on your back to sitting on the side of a flat bed without using bedrails?: None Help needed moving to and from a bed to a chair (including a wheelchair)?: None Help needed standing up from a chair using your arms (e.g., wheelchair or bedside chair)?: None Help needed to walk in hospital room?: None Help needed climbing 3-5 steps with a railing? : None 6 Click Score: 24    End of Session   Activity Tolerance: Patient tolerated treatment well Patient left: in bed;with nursing/sitter in room Nurse Communication: Mobility status      Time: 6644-0347 PT Time Calculation (min) (ACUTE ONLY): 11 min   Charges:   PT Evaluation $PT Eval Low Complexity: 1 Low   PT General Charges $$ ACUTE PT VISIT: 1 Visit        Harrel Carina, DPT, CLT  Acute Rehabilitation Services Office: [phone] (Secure chat preferred)   Claudia Desanctis 06/26/2023, 10:31 AM

## 2023-06-26 NOTE — Code Documentation (Signed)
Stroke Response Nurse Documentation Code Documentation  Taylor Black is a 52 y.o. female arriving to [facility]  via [facility] on 8/23 with past medical hx of HTN, cardiomyopathy, CAD and depression. On No antithrombotic. Code stroke was activated by EMS.   Patient from home where she was LKW at 0001 and now complaining of right sided weakness, facial droop and speech difficulty .    Stroke team at the bedside on patient arrival. Labs drawn and patient cleared for CT by Dr. Pilar Plate. Patient to CT with team. NIHSS 12, see documentation for details and code stroke times. Patient with disoriented, right facial droop, right arm weakness, right leg weakness, right decreased sensation, Expressive aphasia , and dysarthria  on exam. The following imaging was completed:  CT Head and CTA. Patient is a candidate for IV Thrombolytic due to fixed neurological deficit. Patient is not a candidate for IR due to no LVO.   Care Plan: TNKase, Cleviprex.   Bedside handoff with ED RN Reita Cliche.    Rose Fillers  Rapid Response RN

## 2023-06-26 NOTE — Progress Notes (Signed)
Echocardiogram 2D Echocardiogram has been performed.  Jamair Cato N Mckinnley Smithey,RDCS 06/26/2023, 12:38 PM

## 2023-06-26 NOTE — Progress Notes (Signed)
PHARMACIST CODE STROKE RESPONSE  Notified to mix TNK at 0311 by Dr. Wilford Corner TNK preparation completed at 0315  TNK dose = 22 mg IV over 5 seconds  Issues/delays encountered (if applicable): N/A  Taylor Black 06/26/23 3:23 AM

## 2023-06-26 NOTE — ED Triage Notes (Signed)
Patient arrived with EMS from home LSN 12 MN . Family reports slurred speech with right arm/right leg weakness and right facial droop . Evaluated by EDP and neurologist at arrival , transported to CT scan . TNK given at 0317 . Labetalol 10 mg given prior to TNK .

## 2023-06-26 NOTE — ED Notes (Signed)
ED TO INPATIENT HANDOFF REPORT  ED Nurse Name and Phone #:  Les Pou RN  [phone]  S Name/Age/Gender Taylor Black Taylor Black 52 y.o. female Room/Bed: TRACC/TRACC  Code Status   Code Status: Full Code  Home/SNF/Other Home Patient oriented to: self, place, time, and situation Is this baseline? Yes   Triage Complete: Triage complete  Chief Complaint Acute ischemic stroke [facility]) [I63.9]  Triage Note Patient arrived with EMS from home LSN 12 MN . Family reports slurred speech with right arm/right leg weakness and right facial droop . Evaluated by EDP and neurologist at arrival , transported to CT scan . TNK given at 0317 . Labetalol 10 mg given prior to TNK .    Allergies Allergies  Allergen Reactions   Morphine And Codeine Hives and Other (See Comments)    Profuse sweating    Level of Care/Admitting Diagnosis ED Disposition     ED Disposition  Admit   Condition  --   Comment  Hospital Area: [facility] [[id number]]  Level of Care: ICU [6]  May admit patient to [facility] or [facility] if equivalent level of care is available:: No  Covid Evaluation: Asymptomatic - no recent exposure (last 10 days) testing not required  Diagnosis: Acute ischemic stroke [facility]) [[id number]]  Admitting Physician: Milon Dikes [[id number]]  Attending Physician: Milon Dikes [[id number]]  Certification:: I certify this patient will need inpatient services for at least 2 midnights  Expected Medical Readiness: 06/29/2023          B Medical/Surgery History 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  06/13/2019   Chicken pox    Depression    GERD (gastroesophageal reflux disease)    LV dysfunction  06/13/2019   Past Surgical History:  Procedure Laterality Date   BIOPSY  05/29/2020   Procedure: BIOPSY;  Surgeon: Shellia Cleverly, DO;  Location: [facility] ENDOSCOPY;  Service: Gastroenterology;;   bowel obstruction  1989    COLONOSCOPY WITH PROPOFOL N/A 05/29/2020   Procedure: COLONOSCOPY WITH PROPOFOL;  Surgeon: Shellia Cleverly, DO;  Location: [facility] ENDOSCOPY;  Service: Gastroenterology;  Laterality: N/A;   ESOPHAGOGASTRODUODENOSCOPY (EGD) WITH PROPOFOL N/A 05/29/2020   Procedure: ESOPHAGOGASTRODUODENOSCOPY (EGD) WITH PROPOFOL;  Surgeon: Shellia Cleverly, DO;  Location: [facility] ENDOSCOPY;  Service: Gastroenterology;  Laterality: N/A;   LEFT HEART CATH AND CORONARY ANGIOGRAPHY N/A 06/13/2019   Procedure: LEFT HEART CATH AND CORONARY ANGIOGRAPHY;  Surgeon: Lyn Records, MD;  Location: [facility] INVASIVE CV LAB;  Service: Cardiovascular;  Laterality: N/A;   POLYPECTOMY  05/29/2020   Procedure: POLYPECTOMY;  Surgeon: Shellia Cleverly, DO;  Location: [facility] ENDOSCOPY;  Service: Gastroenterology;;   UMBILICAL HERNIA REPAIR  2017     A IV Location/Drains/Wounds Patient Lines/Drains/Airways Status     Active Line/Drains/Airways     Name Placement date Placement time Site Days   Peripheral IV 06/26/23 Left Antecubital 06/26/23  0300  Antecubital  less than 1   Peripheral IV 06/26/23 20 G Right Forearm 06/26/23  --  Forearm  less than 1            Intake/Output Last 24 hours No intake or output data in the 24 hours ending 06/26/23 0433  Labs/Imaging Results for orders placed or performed during the hospital encounter of 06/26/23 (from the past 48 hour(s))  CBG monitoring, ED     Status: Abnormal   Collection Time: 06/26/23  3:04 AM  Result Value Ref Range   Glucose-Capillary 150 (H) 70 - 99 mg/dL    Comment: Glucose reference range applies only to samples taken after fasting for at least 8 hours.  Protime-INR     Status: None   Collection Time: 06/26/23  3:07 AM  Result Value Ref Range   Prothrombin Time 12.8 11.4 - 15.2 seconds   INR 0.9 0.8 - 1.2    Comment: (NOTE) INR goal varies based on device and disease states. Performed at [facility], [address] [place], [place] [address]   APTT     Status:  Abnormal   Collection Time: 06/26/23  3:07 AM  Result Value Ref Range   aPTT 21 (L) 24 - 36 seconds    Comment: Performed at [facility], [address], [place], [place] [address]  CBC     Status: Abnormal   Collection Time: 06/26/23  3:07 AM  Result Value Ref Range   WBC 6.2 4.0 - 10.5 K/uL   RBC 4.87 3.87 - 5.11 MIL/uL   Hemoglobin 12.3 12.0 - 15.0 g/dL   HCT 82.9 56.2 - 13.0 %   MCV 82.1 80.0 - 100.0 fL   MCH 25.3 (L) 26.0 - 34.0 pg   MCHC 30.8 30.0 - 36.0 g/dL   RDW 86.5 78.4 - 69.6 %   Platelets 312 150 - 400 K/uL   nRBC 0.0 0.0 - 0.2 %    Comment: Performed at [facility], [address] [place], [place] [address]  Differential     Status: None   Collection Time: 06/26/23  3:07 AM  Result Value Ref Range   Neutrophils Relative % 60 %   Neutro Abs 3.7 1.7 - 7.7 K/uL   Lymphocytes Relative 28 %   Lymphs Abs 1.8 0.7 - 4.0 K/uL   Monocytes Relative 9 %   Monocytes Absolute 0.6 0.1 - 1.0 K/uL   Eosinophils Relative 2 %   Eosinophils Absolute 0.1 0.0 - 0.5 K/uL   Basophils Relative 1 %   Basophils Absolute 0.0 0.0 - 0.1 K/uL   Immature Granulocytes 0 %   Abs Immature Granulocytes 0.01 0.00 - 0.07 K/uL    Comment: Performed at [facility], [address], [place], [place] [address]  Comprehensive metabolic panel     Status: Abnormal   Collection Time: 06/26/23  3:07 AM  Result Value Ref Range   Sodium 139 135 - 145 mmol/L   Potassium 4.3 3.5 - 5.1 mmol/L   Chloride 106 98 - 111 mmol/L   CO2 22 22 - 32 mmol/L   Glucose, Bld 174 (H) 70 - 99 mg/dL    Comment: Glucose reference range applies only to samples taken after fasting for at least 8 hours.   BUN 11 6 - 20 mg/dL   Creatinine, Ser 4.01 0.44 - 1.00 mg/dL   Calcium 8.4 (L) 8.9 - 10.3 mg/dL   Total Protein 6.8 6.5 - 8.1 g/dL   Albumin 3.8 3.5 - 5.0 g/dL   AST 31 15 - 41 U/L   ALT 20 0 - 44 U/L   Alkaline Phosphatase 67 38 - 126 U/L   Total Bilirubin 0.3 0.3 - 1.2 mg/dL   GFR, Estimated >02  >72 mL/min    Comment: (NOTE) Calculated using the CKD-EPI Creatinine Equation (2021)    Anion gap 11 5 - 15    Comment: Performed at [facility], [address], [place], [place]  [address]  Ethanol     Status: Abnormal   Collection Time: 06/26/23  3:07 AM  Result Value Ref Range   Alcohol, Ethyl (B) 64 (H) <10 mg/dL    Comment: (NOTE) Lowest detectable limit for serum alcohol is 10 mg/dL.  For medical purposes only. Performed at [facility] Lab, [address] [place], [place] [address]   I-stat chem 8, ED     Status: Abnormal   Collection Time: 06/26/23  3:14 AM  Result Value Ref Range   Sodium 142 135 - 145 mmol/L   Potassium 4.2 3.5 - 5.1 mmol/L   Chloride 106 98 - 111 mmol/L   BUN 12 6 - 20 mg/dL   Creatinine, Ser 5.64 0.44 - 1.00 mg/dL   Glucose, Bld 332 (H) 70 - 99 mg/dL    Comment: Glucose reference range applies only to samples taken after fasting for at least 8 hours.   Calcium, Ion 1.08 (L) 1.15 - 1.40 mmol/L   TCO2 23 22 - 32 mmol/L   Hemoglobin 13.6 12.0 - 15.0 g/dL   HCT 95.1 88.4 - 16.6 %   CT ANGIO HEAD NECK W WO CM (CODE STROKE)  Result Date: 06/26/2023 CLINICAL DATA:  Right-sided deficits, stroke suspected EXAM: CT ANGIOGRAPHY HEAD AND NECK WITH AND WITHOUT CONTRAST TECHNIQUE: Multidetector CT imaging of the head and neck was performed using the standard protocol during bolus administration of intravenous contrast. Multiplanar CT image reconstructions and MIPs were obtained to evaluate the vascular anatomy. Carotid stenosis measurements (when applicable) are obtained utilizing NASCET criteria, using the distal internal carotid diameter as the denominator. RADIATION DOSE REDUCTION: This exam was performed according to the departmental dose-optimization program which includes automated exposure control, adjustment of the mA and/or kV according to patient size and/or use of iterative reconstruction technique. CONTRAST:  75mL OMNIPAQUE IOHEXOL 350 MG/ML SOLN  COMPARISON:  No prior CTA available, correlation is made with CT head 06/26/2023 FINDINGS: CT HEAD FINDINGS For noncontrast findings, please see same day CT head. CTA NECK FINDINGS Aortic arch: Two-vessel arch with a common origin of the brachiocephalic and left common carotid arteries. Imaged portion shows no evidence of aneurysm or dissection. No significant stenosis of the major arch vessel origins. Right carotid system: No evidence of dissection, occlusion, or hemodynamically significant stenosis (greater than 50%). Left carotid system: No evidence of dissection, occlusion, or hemodynamically significant stenosis (greater than 50%). Vertebral arteries: No evidence of dissection, occlusion, or hemodynamically significant stenosis (greater than 50%). Skeleton: No acute osseous abnormality. Degenerative changes in the cervical spine. Other neck: No acute finding. Upper chest: No focal pulmonary opacity or pleural effusion. Review of the MIP images confirms the above findings CTA HEAD FINDINGS Anterior circulation: Both internal carotid arteries are patent to the termini, without significant stenosis. Aplastic right A1. Patent left A1. Normal anterior communicating artery. Anterior cerebral arteries are patent to their distal aspects without significant stenosis. No M1 stenosis or occlusion. MCA branches perfused to their distal aspects without significant stenosis. Posterior circulation: Vertebral arteries patent to the vertebrobasilar junction without significant stenosis. Posterior inferior cerebellar arteries patent proximally. Basilar patent to its distal aspect without significant stenosis. Superior cerebellar arteries patent proximally. Patent P1 segments. PCAs perfused to their distal aspects without significant stenosis. The bilateral posterior communicating arteries are diminutive but patent. Venous sinuses: As permitted by contrast timing, patent. Anatomic variants: None significant. Review of the MIP  images confirms the above findings IMPRESSION: 1. No intracranial large vessel occlusion or significant stenosis.  2. No hemodynamically significant stenosis in the neck. Electronically Signed   By: Wiliam Ke M.D.   On: 06/26/2023 03:35   CT HEAD CODE STROKE WO CONTRAST  Result Date: 06/26/2023 CLINICAL DATA:  Code stroke.  Right-sided deficits EXAM: CT HEAD WITHOUT CONTRAST TECHNIQUE: Contiguous axial images were obtained from the base of the skull through the vertex without intravenous contrast. RADIATION DOSE REDUCTION: This exam was performed according to the departmental dose-optimization program which includes automated exposure control, adjustment of the mA and/or kV according to patient size and/or use of iterative reconstruction technique. COMPARISON:  None Available. FINDINGS: Brain: No evidence of acute infarction, hemorrhage, mass, mass effect, or midline shift. No hydrocephalus or extra-axial collection. Vascular: No hyperdense vessel. Skull: Negative for fracture or focal lesion. Sinuses/Orbits: No acute finding. Other: The mastoid air cells are well aerated. ASPECTS [facility] Stroke Program Early CT Score) - Ganglionic level infarction (caudate, lentiform nuclei, internal capsule, insula, M1-M3 cortex): 7 - Supraganglionic infarction (M4-M6 cortex): 3 Total score (0-10 with 10 being normal): 10 IMPRESSION: No evidence of acute intracranial abnormality. ASPECTS is 10. Imaging results were communicated on 06/26/2023 at 3:13 am to provider Dr. Wilford Corner via secure text paging. Electronically Signed   By: Wiliam Ke M.D.   On: 06/26/2023 03:14    Pending Labs Unresulted Labs (From admission, onward)     Start     Ordered   06/26/23 0500  Lipid panel  (Labs)  Tomorrow morning,   R       Comments: Fasting    06/26/23 0314   06/26/23 0342  hCG, quantitative, pregnancy  Once,   R        06/26/23 0341   06/26/23 0330  hCG, serum, qualitative  Once,   R        06/26/23 0330   06/26/23 0313   HIV Antibody (routine testing w rflx)  (HIV Antibody (Routine testing w reflex) panel)  Once,   R        06/26/23 0314   06/26/23 0313  Hemoglobin A1c  (Labs)  Once,   R       Comments: To assess prior glycemic control    06/26/23 0314            Vitals/Pain Today's Vitals   06/26/23 0408 06/26/23 0415 06/26/23 0417 06/26/23 0431  BP:  (!) 151/90 (!) 145/88   Pulse:  98 91   Resp:  19 14   Temp:      TempSrc:      SpO2:  98% 96%   Weight:      Height:      PainSc: 0-No pain   0-No pain    Isolation Precautions No active isolations  Medications Medications  sodium chloride flush (NS) 0.9 % injection 3 mL (3 mLs Intravenous Not Given 06/26/23 0355)  labetalol (NORMODYNE) injection 20 mg (10 mg Intravenous Given 06/26/23 0316)    And  clevidipine (CLEVIPREX) infusion 0.5 mg/mL (has no administration in time range)   stroke: early stages of recovery book (has no administration in time range)  0.9 %  sodium chloride infusion ( Intravenous New Bag/Given 06/26/23 0356)  acetaminophen (TYLENOL) tablet 650 mg (650 mg Oral Given 06/26/23 0404)    Or  acetaminophen (TYLENOL) 160 MG/5ML solution 650 mg ( Per Tube See Alternative 06/26/23 0404)    Or  acetaminophen (TYLENOL) suppository 650 mg ( Rectal See Alternative 06/26/23 0404)  senna-docusate (Senokot-S) tablet 1 tablet (has no administration in time  range)  pantoprazole (PROTONIX) injection 40 mg (has no administration in time range)  labetalol (NORMODYNE) injection 20 mg (20 mg Intravenous Not Given 06/26/23 0357)    And  clevidipine (CLEVIPREX) infusion 0.5 mg/mL (has no administration in time range)  tenecteplase (TNKASE) injection for Stroke 22 mg (22 mg Intravenous Given 06/26/23 0317)  iohexol (OMNIPAQUE) 350 MG/ML injection 75 mL (75 mLs Intravenous Contrast Given 06/26/23 0326)    Mobility walks     Focused Assessments     R Recommendations: See Admitting Provider Note  Report given to:   Additional Notes:

## 2023-06-26 NOTE — H&P (Signed)
Stroke Neurology Admission History & Physical   CC: Right-sided weakness, speech difficulty, facial droop  History is obtained from: Patient, chart  HPI: Taylor Black is a 52 y.o. female past medical history of depression, hypertension, hypertensive cardiomyopathy, nonobstructive CAD, not on any medications presenting to the emergency department for sudden onset of right-sided weakness.  Last known well around midnight when she went to bed.  Then the daughter heard thud and her trying to call for help.  Her speech made no sense.  EMS was called.  Stroke symptoms were identified and she was brought in for evaluation as an acute code stroke. She was evaluated on the bridge.  Noncontrast head CT unremarkable. On examination-symptoms consistent with a left hemispheric infarct.  Risk benefits and alternatives of TNKase discussed with the patient and she agreed to proceed with TNK. She was given TNKase and will be admitted for further workup.  Reports noncompliance to medications. Reports tobacco abuse.   LKW: 0001 hrs on 06/26/2023 IV thrombolysis given?:  Yes Premorbid modified Rankin scale (mRS): 0   ROS: Full ROS was performed and is negative except as noted in the HPI. 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 She has never used smokeless tobacco. She reports current alcohol use of about 3.0 standard drinks of alcohol per week. She reports  current drug use. Drug: Marijuana.  Medications  Current Facility-Administered Medications:    [START ON 06/27/2023]  stroke: early stages of recovery book, , Does not apply, Once, Milon Dikes, MD   0.9 %  sodium chloride infusion, , Intravenous, Continuous, Milon Dikes, MD   acetaminophen (TYLENOL) tablet 650 mg, 650 mg, Oral, Q4H PRN **OR** acetaminophen (TYLENOL) 160 MG/5ML solution 650 mg, 650 mg, Per Tube, Q4H PRN **OR** acetaminophen (TYLENOL) suppository 650 mg, 650 mg, Rectal, Q4H PRN, Milon Dikes, MD   labetalol (NORMODYNE) injection 20 mg, 20 mg, Intravenous, Once **AND** clevidipine (CLEVIPREX) infusion 0.5 mg/mL, 0-21 mg/hr, Intravenous, Continuous, Milon Dikes, MD   labetalol (NORMODYNE) injection 20 mg, 20 mg, Intravenous, Once **AND** clevidipine (CLEVIPREX) infusion 0.5 mg/mL, 0-21 mg/hr, Intravenous, Continuous, Milon Dikes, MD   pantoprazole (PROTONIX) injection 40 mg, 40 mg, Intravenous, Daily, Milon Dikes, MD   senna-docusate (Senokot-S) tablet 1 tablet, 1 tablet, Oral, QHS PRN, Milon Dikes, MD   sodium chloride flush (NS) 0.9 % injection 3 mL, 3 mL, Intravenous, Once, Bero, Elmer Sow, MD  Current Outpatient Medications:    albuterol (VENTOLIN HFA) 108 (90 Base) MCG/ACT inhaler, Inhale 1-2 puffs into the lungs every 6 (six) hours as needed for wheezing or shortness of breath., Disp: 18 g, Rfl: 0   aspirin EC 81 MG tablet, Take 1 tablet (81 mg total) by mouth daily with breakfast. Swallow whole., Disp: 30 tablet, Rfl: 11   atorvastatin (LIPITOR) 20 MG  tablet, Take 1 tablet (20 mg total) by mouth daily. Please call [phone] to schedule a follow up appointment., Disp: 30 tablet, Rfl: 0   carvedilol (COREG) 25 MG tablet, Take 1 tablet (25 mg total) by mouth 2 (two) times daily with a meal. Please call [phone] to schedule a follow up appointment., Disp: 60 tablet, Rfl: 0   ferrous sulfate 325 (65 FE) MG tablet, Take 1 tablet (325 mg total) by mouth 2 (two)  times daily with a meal., Disp: 60 tablet, Rfl: 0   folic acid (FOLVITE) 1 MG tablet, Take 1 tablet (1 mg total) by mouth daily., Disp: 30 tablet, Rfl: 0   losartan (COZAAR) 100 MG tablet, Take 1 tablet (100 mg total) by mouth daily. Needs appointment for further refills., Disp: 30 tablet, Rfl: 0   medroxyPROGESTERone (PROVERA) 10 MG tablet, Take 1 tablet (10 mg total) by mouth daily. Start when you get a period and continue for 10 days, Disp: 10 tablet, Rfl: 3   omeprazole (PRILOSEC) 40 MG capsule, TAKE 1 CAPSULE DAILY, Disp: 30 capsule, Rfl: 0   predniSONE (DELTASONE) 20 MG tablet, Take 2 tablets daily with breakfast., Disp: 10 tablet, Rfl: 0   traZODone (DESYREL) 50 MG tablet, TAKE 1/2 TO 1 TABLET AT    BEDTIME AS NEEDED FOR SLEEP, Disp: 30 tablet, Rfl: 0   Exam: Current vital signs: Ht 5' (1.524 m)   Wt 89 kg   BMI 38.32 kg/m  Vital signs in last 24 hours: Weight:  [88 kg-89 kg] 89 kg (08/23 0333) General: Awake alert in some distress due to weakness on the right side HEENT: Normocephalic atraumatic Lungs: Scattered rales CVS: Regular rate rhythm Neurological exam She is awake alert She is able to nod yes and no appropriately to questions She follows commands Her speech is extremely dysarthric It seems she has moderate expressive aphasia Cranial nerves: Pupils equal round react light, extract movements intact, visual fields appear full, right face has weakness in the lower part of the face at rest and on trying to smile.  Tongue and palate midline. Motor examination with barely antigravity right upper extremity, barely antigravity right lower extremity-both showed drift.  Left side is full strength. Sensation diminished on the right Coordination difficult to assess but no gross dysmetria NIH stroke scale 1a Level of Conscious.: 0 1b LOC Questions: 2 1c LOC Commands: 0 2 Best Gaze: 0 3 Visual: 0 4 Facial Palsy: 1 5a Motor Arm - left: 0 5b Motor Arm - Right: 2 6a Motor Leg -  Left: 0 6b Motor Leg - Right: 2 7 Limb Ataxia: 0 8 Sensory: 1 9 Best Language: 2 10 Dysarthria: 1 11 Extinct. and Inatten.: 0 TOTAL: 12   Labs I have reviewed labs in epic and the results pertinent to this consultation are:  CBC    Component Value Date/Time   WBC 6.2 06/26/2023 0307   RBC 4.87 06/26/2023 0307   HGB 13.6 06/26/2023 0314   HCT 40.0 06/26/2023 0314   PLT 312 06/26/2023 0307   MCV 82.1 06/26/2023 0307   MCH 25.3 (L) 06/26/2023 0307   MCHC 30.8 06/26/2023 0307   RDW 14.7 06/26/2023 0307   LYMPHSABS 1.8 06/26/2023 0307   MONOABS 0.6 06/26/2023 0307   EOSABS 0.1 06/26/2023 0307   BASOSABS 0.0 06/26/2023 0307    CMP     Component Value Date/Time   NA 142 06/26/2023 0314   K 4.2 06/26/2023 0314   CL 106 06/26/2023 0314  CO2 22 05/29/2020 0214   GLUCOSE 168 (H) 06/26/2023 0314   BUN 12 06/26/2023 0314   CREATININE 0.90 06/26/2023 0314   CALCIUM 8.9 05/29/2020 0214   PROT 6.9 05/29/2020 0214   ALBUMIN 3.7 05/29/2020 0214   AST 32 05/29/2020 0214   ALT 22 05/29/2020 0214   ALKPHOS 52 05/29/2020 0214   BILITOT 0.6 05/29/2020 0214   GFRNONAA >60 05/29/2020 0214   GFRAA >60 05/29/2020 0214   Imaging I have reviewed the images obtained:  CT-head-aspects 10.  No acute intracranial process CT angio head and neck-telemetry reviewed-no emergent LVO  Assessment:  52 year old with above past medical history presenting with sudden onset of difficulty speaking, slurred speech, right facial weakness and right-sided hemiparesis.  NIH stroke scale upon arrival 6.  Symptoms attributable to the left hemisphere dysfunction. CT head with no acute intracranial process.  Risks benefits and alternatives of IV TNKase discussed and she agreed to proceed.  Required 1 dose of labetalol IV and Cleviprex for blood pressure control prior to TNK administration  IMPRESSION: Acute Ischemic Stroke Hypertensive emergency  Plan: Acuity: Acute Current Suspected Etiology:  Under investigation Continue Evaluation:  -Admit to: Neurological ICU -Hold Aspirin until 24 hour post IV thrombolysis (tPA or TNKase) neuroimaging is stable and without evidence of bleeding -Blood pressure control, goal of 180/105 or below.  Avoid hypotension -MRI/ECHO/A1C/Lipid panel. -Hyperglycemia management per SSI to maintain glucose 140-180mg /dL. -PT/OT/ST therapies and recommendations when able -Hypertensive emergency-blood pressure goal as above. -Gentle hydration with normal saline 75 cc an hour -Check CBC and BMP tomorrow -Check urinary toxicology screen -Statin for goal LDL less than 70.  Goal A1c less than 7.   Prophylaxis DVT: SCDs only.  No antiplatelets or anticoagulants for the next 24 hours. GI: PPI Bowel: Docusate/senna  Diet: NPO until cleared by bedside swallow/speech evaluation  Code Status: Full Code    THE FOLLOWING WERE PRESENT ON ADMISSION: Acute ischemic stroke, hemiparesis, hypertensive emergency  Risks, benefits, alternatives of IV thrombolysis were discussed and family/patient agreed to proceed. CT imaging was reviewed personally prior to IV thrombolysis administration with no evidence of bleed. -- Milon Dikes, MD Neurologist [facility] Pager: [phone]    CRITICAL CARE ATTESTATION Performed by: Milon Dikes, MD Total critical care time: 40 minutes Critical care time was exclusive of separately billable procedures and treating other patients and/or supervising APPs/Residents/Students Critical care was necessary to treat or prevent imminent or life-threatening deterioration. This patient is critically ill and at significant risk for neurological worsening and/or death and care requires constant monitoring. Critical care was time spent personally by me on the following activities: development of treatment plan with patient and/or surrogate as well as nursing, discussions with consultants, evaluation of patient's response to  treatment, examination of patient, obtaining history from patient or surrogate, ordering and performing treatments and interventions, ordering and review of laboratory studies, ordering and review of radiographic studies, pulse oximetry, re-evaluation of patient's condition, participation in multidisciplinary rounds and medical decision making of high complexity in the care of this patient.

## 2023-06-26 NOTE — Progress Notes (Addendum)
STROKE TEAM PROGRESS NOTE   BRIEF HPI Taylor Black is a 52 y.o. female past medical history of depression, hypertension, hypertensive cardiomyopathy, nonobstructive CAD, not on any medications presenting to the emergency department for sudden onset of right-sided weakness and speech difficulties. NIHSS 12 on arrival. Received TNK.    SIGNIFICANT HOSPITAL EVENTS 8/23 presented to ED code stroke. Received TNK  INTERIM HISTORY/SUBJECTIVE Patient reports improvement of her symptoms. Reports that she sees a cardiologist but has not been in 3 1/2 years. Reports that she has had insurance issues and this is reason she has not returned. Informed her that we could have TOC talked to her and she if they can help. She does not report history of Afib. Told her that if she is open to staying over the weekend that we could get a TEE and 30 day event moniter. She was agreeable and both are scheduled to occur on Monday.    OBJECTIVE  CBC    Component Value Date/Time   WBC 6.2 06/26/2023 0307   RBC 4.87 06/26/2023 0307   HGB 13.6 06/26/2023 0314   HCT 40.0 06/26/2023 0314   PLT 312 06/26/2023 0307   MCV 82.1 06/26/2023 0307   MCH 25.3 (L) 06/26/2023 0307   MCHC 30.8 06/26/2023 0307   RDW 14.7 06/26/2023 0307   LYMPHSABS 1.8 06/26/2023 0307   MONOABS 0.6 06/26/2023 0307   EOSABS 0.1 06/26/2023 0307   BASOSABS 0.0 06/26/2023 0307    BMET    Component Value Date/Time   NA 142 06/26/2023 0314   K 4.2 06/26/2023 0314   CL 106 06/26/2023 0314   CO2 22 06/26/2023 0307   GLUCOSE 168 (H) 06/26/2023 0314   BUN 12 06/26/2023 0314   CREATININE 0.90 06/26/2023 0314   CALCIUM 8.4 (L) 06/26/2023 0307   GFRNONAA >60 06/26/2023 0307    IMAGING past 24 hours ECHOCARDIOGRAM COMPLETE BUBBLE STUDY  Result Date: 06/26/2023    ECHOCARDIOGRAM REPORT   Patient Name:   Damesha Jerelyn Scott Date of Exam: 06/26/2023 Medical Rec #:  [id number]               Height:       60.0 in Accession #:     [id number]              Weight:       196.2 lb Date of Birth:  1971/06/06               BSA:          1.851 m Patient Age:    52 years                BP:           132/91 mmHg Patient Gender: F                       HR:           72 bpm. Exam Location:  Inpatient Procedure: 2D Echo, Color Doppler, Cardiac Doppler and Saline Contrast Bubble            Study Indications:    Stroke  History:        Patient has prior history of Echocardiogram examinations, most                 recent 12/14/2019. Dilated Cardiomyopathy, CAD; Risk                 Factors:Current Smoker, Hypertension  and Dyslipidemia. GERD.  Sonographer:    Raeford Razor RDCS Referring Phys: [id number] ASHISH ARORA IMPRESSIONS  1. Left ventricular ejection fraction, by estimation, is 60 to 65%. The left ventricle has normal function. The left ventricle has no regional wall motion abnormalities. Left ventricular diastolic parameters are consistent with Grade I diastolic dysfunction (impaired relaxation).  2. Right ventricular systolic function is normal. The right ventricular size is normal.  3. Left atrial size was mildly dilated.  4. The mitral valve is normal in structure. Trivial mitral valve regurgitation. No evidence of mitral stenosis.  5. The aortic valve is tricuspid. There is mild calcification of the aortic valve. Aortic valve regurgitation is trivial. No aortic stenosis is present.  6. The inferior vena cava is dilated in size with >50% respiratory variability, suggesting right atrial pressure of 8 mmHg.  7. Agitated saline contrast bubble study was negative, with no evidence of any interatrial shunt. FINDINGS  Left Ventricle: Left ventricular ejection fraction, by estimation, is 60 to 65%. The left ventricle has normal function. The left ventricle has no regional wall motion abnormalities. The left ventricular internal cavity size was normal in size. There is  no left ventricular hypertrophy. Left ventricular diastolic parameters are consistent with  Grade I diastolic dysfunction (impaired relaxation). Right Ventricle: The right ventricular size is normal. No increase in right ventricular wall thickness. Right ventricular systolic function is normal. Left Atrium: Left atrial size was mildly dilated. Right Atrium: Right atrial size was normal in size. Pericardium: There is no evidence of pericardial effusion. Mitral Valve: The mitral valve is normal in structure. Trivial mitral valve regurgitation. No evidence of mitral valve stenosis. Tricuspid Valve: The tricuspid valve is normal in structure. Tricuspid valve regurgitation is trivial. No evidence of tricuspid stenosis. Aortic Valve: The aortic valve is tricuspid. There is mild calcification of the aortic valve. Aortic valve regurgitation is trivial. No aortic stenosis is present. Aortic valve peak gradient measures 10.5 mmHg. Pulmonic Valve: The pulmonic valve was normal in structure. Pulmonic valve regurgitation is not visualized. No evidence of pulmonic stenosis. Aorta: The aortic root is normal in size and structure. Venous: The inferior vena cava is dilated in size with greater than 50% respiratory variability, suggesting right atrial pressure of 8 mmHg. IAS/Shunts: No atrial level shunt detected by color flow Doppler. Agitated saline contrast was given intravenously to evaluate for intracardiac shunting. Agitated saline contrast bubble study was negative, with no evidence of any interatrial shunt.  LEFT VENTRICLE PLAX 2D LVIDd:         4.40 cm      Diastology LVIDs:         2.80 cm      LV e' medial:    6.74 cm/s LV PW:         0.80 cm      LV E/e' medial:  12.4 LV IVS:        0.80 cm      LV e' lateral:   8.05 cm/s LVOT diam:     2.00 cm      LV E/e' lateral: 10.4 LV SV:         67 LV SV Index:   36 LVOT Area:     3.14 cm  LV Volumes (MOD) LV vol d, MOD A2C: 164.0 ml LV vol d, MOD A4C: 115.0 ml LV vol s, MOD A2C: 57.6 ml LV vol s, MOD A4C: 42.1 ml LV SV MOD A2C:     106.4 ml LV SV MOD  A4C:     115.0 ml  LV SV MOD BP:      89.0 ml RIGHT VENTRICLE             IVC RV Basal diam:  2.20 cm     IVC diam: 2.10 cm RV S prime:     12.40 cm/s TAPSE (M-mode): 3.1 cm LEFT ATRIUM             Index        RIGHT ATRIUM           Index LA diam:        3.60 cm 1.94 cm/m   RA Area:     13.80 cm LA Vol (A2C):   61.4 ml 33.16 ml/m  RA Volume:   33.10 ml  17.88 ml/m LA Vol (A4C):   51.7 ml 27.92 ml/m LA Biplane Vol: 57.4 ml 31.00 ml/m  AORTIC VALVE AV Area (Vmax): 2.02 cm AV Vmax:        162.00 cm/s AV Peak Grad:   10.5 mmHg LVOT Vmax:      104.00 cm/s LVOT Vmean:     66.600 cm/s LVOT VTI:       0.212 m  AORTA Ao Root diam: 2.80 cm Ao Asc diam:  3.30 cm MITRAL VALVE MV Area (PHT): 4.06 cm    SHUNTS MV Decel Time: 187 msec    Systemic VTI:  0.21 m MV E velocity: 83.60 cm/s  Systemic Diam: 2.00 cm MV A velocity: 99.80 cm/s MV E/A ratio:  0.84 Arvilla Meres MD Electronically signed by Arvilla Meres MD Signature Date/Time: 06/26/2023/12:49:19 PM    Final    MR BRAIN WO CONTRAST  Result Date: 06/26/2023 CLINICAL DATA:  Provided history: Stroke, follow-up. Right-sided weakness. Speech difficulty. EXAM: MRI HEAD WITHOUT CONTRAST TECHNIQUE: Multiplanar, multiecho pulse sequences of the brain and surrounding structures were obtained without intravenous contrast. COMPARISON:  Non-contrast head CT and CT angiogram head/neck 06/26/2023. FINDINGS: Brain: No age advanced or lobar predominant parenchymal atrophy. There are multiple small acute/early subacute cortical and subcortical infarcts within the left cerebral hemisphere, within the left frontal, parietal and occipital lobes (affecting the left MCA territory and left MCA/PCA watershed territory) (see annotations on series 2). An additional small acute cortical infarct is questioned within the posterior left frontal lobe (ACA territory) (series 2, image 43). Mild multifocal T2 FLAIR hyperintense signal abnormality elsewhere within the cerebral white matter, nonspecific but  compatible with chronic small vessel ischemic disease. Small chronic infarct within the right cerebellar hemisphere. No evidence of an intracranial mass. No chronic intracranial blood products. No extra-axial fluid collection. No midline shift. Vascular: Maintained flow voids within the proximal large arterial vessels. Skull and upper cervical spine: No focal suspicious marrow lesion. Incompletely assessed cervical spondylosis. Sinuses/Orbits: No mass or acute finding within the imaged orbits. Mild mucosal thickening within a posterior right ethmoid air cell. IMPRESSION: 1. Multiple small acute/early subacute infarcts within the left MCA territory and MCA/PCA watershed territory. 2. Possible additional small acute left ACA territory infarct within the posterior left frontal lobe. 3. Background mild cerebral white matter chronic small vessel ischemic disease. 4. Small chronic infarct within the right cerebellar hemisphere. Electronically Signed   By: Jackey Loge D.O.   On: 06/26/2023 10:57   CT ANGIO HEAD NECK W WO CM (CODE STROKE)  Result Date: 06/26/2023 CLINICAL DATA:  Right-sided deficits, stroke suspected EXAM: CT ANGIOGRAPHY HEAD AND NECK WITH AND WITHOUT CONTRAST TECHNIQUE: Multidetector CT imaging of the head and  neck was performed using the standard protocol during bolus administration of intravenous contrast. Multiplanar CT image reconstructions and MIPs were obtained to evaluate the vascular anatomy. Carotid stenosis measurements (when applicable) are obtained utilizing NASCET criteria, using the distal internal carotid diameter as the denominator. RADIATION DOSE REDUCTION: This exam was performed according to the departmental dose-optimization program which includes automated exposure control, adjustment of the mA and/or kV according to patient size and/or use of iterative reconstruction technique. CONTRAST:  75mL OMNIPAQUE IOHEXOL 350 MG/ML SOLN COMPARISON:  No prior CTA available, correlation is  made with CT head 06/26/2023 FINDINGS: CT HEAD FINDINGS For noncontrast findings, please see same day CT head. CTA NECK FINDINGS Aortic arch: Two-vessel arch with a common origin of the brachiocephalic and left common carotid arteries. Imaged portion shows no evidence of aneurysm or dissection. No significant stenosis of the major arch vessel origins. Right carotid system: No evidence of dissection, occlusion, or hemodynamically significant stenosis (greater than 50%). Left carotid system: No evidence of dissection, occlusion, or hemodynamically significant stenosis (greater than 50%). Vertebral arteries: No evidence of dissection, occlusion, or hemodynamically significant stenosis (greater than 50%). Skeleton: No acute osseous abnormality. Degenerative changes in the cervical spine. Other neck: No acute finding. Upper chest: No focal pulmonary opacity or pleural effusion. Review of the MIP images confirms the above findings CTA HEAD FINDINGS Anterior circulation: Both internal carotid arteries are patent to the termini, without significant stenosis. Aplastic right A1. Patent left A1. Normal anterior communicating artery. Anterior cerebral arteries are patent to their distal aspects without significant stenosis. No M1 stenosis or occlusion. MCA branches perfused to their distal aspects without significant stenosis. Posterior circulation: Vertebral arteries patent to the vertebrobasilar junction without significant stenosis. Posterior inferior cerebellar arteries patent proximally. Basilar patent to its distal aspect without significant stenosis. Superior cerebellar arteries patent proximally. Patent P1 segments. PCAs perfused to their distal aspects without significant stenosis. The bilateral posterior communicating arteries are diminutive but patent. Venous sinuses: As permitted by contrast timing, patent. Anatomic variants: None significant. Review of the MIP images confirms the above findings IMPRESSION: 1. No  intracranial large vessel occlusion or significant stenosis. 2. No hemodynamically significant stenosis in the neck. Electronically Signed   By: Wiliam Ke M.D.   On: 06/26/2023 03:35   CT HEAD CODE STROKE WO CONTRAST  Result Date: 06/26/2023 CLINICAL DATA:  Code stroke.  Right-sided deficits EXAM: CT HEAD WITHOUT CONTRAST TECHNIQUE: Contiguous axial images were obtained from the base of the skull through the vertex without intravenous contrast. RADIATION DOSE REDUCTION: This exam was performed according to the departmental dose-optimization program which includes automated exposure control, adjustment of the mA and/or kV according to patient size and/or use of iterative reconstruction technique. COMPARISON:  None Available. FINDINGS: Brain: No evidence of acute infarction, hemorrhage, mass, mass effect, or midline shift. No hydrocephalus or extra-axial collection. Vascular: No hyperdense vessel. Skull: Negative for fracture or focal lesion. Sinuses/Orbits: No acute finding. Other: The mastoid air cells are well aerated. ASPECTS [facility] Stroke Program Early CT Score) - Ganglionic level infarction (caudate, lentiform nuclei, internal capsule, insula, M1-M3 cortex): 7 - Supraganglionic infarction (M4-M6 cortex): 3 Total score (0-10 with 10 being normal): 10 IMPRESSION: No evidence of acute intracranial abnormality. ASPECTS is 10. Imaging results were communicated on 06/26/2023 at 3:13 am to provider Dr. Wilford Corner via secure text paging. Electronically Signed   By: Wiliam Ke M.D.   On: 06/26/2023 03:14    Vitals:   06/26/23 1517 06/26/23 1606 06/26/23 1607  06/26/23 1617  BP: 137/71 132/88 132/88 132/88  Pulse:      Resp:  (!) 21 19   Temp:      TempSrc:      SpO2:  100%    Weight:      Height:         PHYSICAL EXAM General:  Alert, well-nourished, well-developed patient in no acute distress Psych:  Mood and affect appropriate for situation CV: Regular rate and rhythm on monitor Respiratory:   Regular, unlabored respirations on room air GI: Abdomen soft and nontender   NEURO:  Mental Status: AA&Ox3, patient is able to give clear and coherent history Speech/Language: speech is without dysarthria or aphasia.  Naming, repetition, fluency, and comprehension intact.  Cranial Nerves:  II: PERRL. Visual fields full.  III, IV, VI: EOMI. Eyelids elevate symmetrically.  V: Sensation is intact to light touch and symmetrical to face.  VII: Face is symmetrical resting and smiling VIII: hearing intact to voice. IX, X: Palate elevates symmetrically. Phonation is normal.  NG:[id number] shrug 5/5. XII: tongue is midline without fasciculations. Motor: 5/5 strength to all muscle groups tested. Resolved R sided weakness.  Tone: is normal and bulk is normal Sensation- Intact to light touch bilaterally. Extinction absent to light touch to DSS.   Coordination: FTN intact bilaterally, HKS: no ataxia in BLE.No drift.  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 ASPECTS 10.    CTA head & neck no LVO or significant stenosis  MRI  Multiple small acute/early subacute infarcts within the left MCA territory and MCA/PCA watershed territory. Possible additional small acute left ACA territory infarct within the posterior left frontal lobe.  Repeat CT (s/p 24hr TNK) pending 2D Echo EF 60-65%. Negative bubble. No intracardiac source of emboli detected.  TEE scheduled Monday Consider loop recorder if workup negative LDL 98 HgbA1c 6.1 VTE prophylaxis - SCDs, s/p TNK <24hr No antithrombotic prior to admission, now on No antithrombotic s/p TNK. Consider DAPT if CT repeat no bleeding Therapy recommendations:  no PT f/u Disposition:  pending  History of dilated  cardiomyopathy Last follow-up with Dr. Eden Emms in 12/2019 Hospitalized August 2020 with chest pain. Cath done 06/13/19 with no obstructive CAD Estimate EF 40-45% with global hypokinesis Started on diuretic, ARB and coreg.  Now EF 60 to 50% TEE scheduled Monday Consider loop recorder if workup negative  Hypertension Home meds:  losartan 100 once daily, carvedilol 25 twice daily Stable, not requiring clevidipine BP goal less than 180/105 post TNK Long-term BP goal normotensive  Hyperlipidemia Home meds:  atorvastatin 20 mg once daily, resumed in hospital LDL 98, goal < 70 Increased atorvastatin from 20 mg to 40 mg Continue statin at discharge  Tobacco Abuse Patient smokes 1 packs per day for unknown number of years reports currently cutting down.  Nicotine replacement therapy provided - nicotine patch 14 mg once daily as needed TOC consult for nicotine cessation  Alcohol use Reported 4-5 drinks several times a week ETOH use, alcohol level 64, advised to drink no more than 1 drink a day No signs of alcohol withdrawal on exam Due to concern for underreporting of alcohol use, start CIWA protocol and thiamine, FA and multivitamin supplemention  Other Stroke Risk Factors Obesity, Body mass index is 38.32 kg/m., BMI >/= 30 associated with  increased stroke risk, recommend weight loss, diet and exercise as appropriate  Family hx stroke (father)  Other Active Problems Insomnia, restarted trazadone 50 mg once at night Uninsured, [facility] consult  Hospital day # 0     ATTENDING NOTE: I reviewed above note and agree with the assessment and plan. Pt was seen and examined.   No family at bedside.  Patient lying bed, neuro intact, symptoms are resolved.  MRI showed left MCA and ACA scattered small infarcts.  Stroke pattern embolic, concerning for cardioembolic source.  Patient does have history of cardiomyopathy, chronic alcohol use and smoker, needed further cardiac embolic workup.  Schedule  TEE on Monday and possible loop placement before discharge.  Pending CT tomorrow morning, if no bleeding, consider DAPT.  Increase Lipitor from 20-40.  PT and OT no recommendations.  For detailed assessment and plan, please refer to above/below as I have made changes wherever appropriate.   Marvel Plan, MD PhD Stroke Neurology 06/26/2023 6:45 PM  This patient is critically ill due to left MCA and ACA stroke status post TNK, cardiomyopathy, chronic alcohol use and at significant risk of neurological worsening, death form recurrent stroke, hemorrhagic transformation, heart failure, alcohol withdrawal. This patient's care requires constant monitoring of vital signs, hemodynamics, respiratory and cardiac monitoring, review of multiple databases, neurological assessment, discussion with family, other specialists and medical decision making of high complexity. I spent 30 minutes of neurocritical care time in the care of this patient.    To contact Stroke Continuity provider, please refer to [email or web address]. After hours, contact General Neurology

## 2023-06-26 NOTE — ED Provider Notes (Signed)
[facility] Emergency Department Provider Note MRN:  [id number]  Arrival date & time: 06/26/23     Chief Complaint   Code stroke History of Present Illness   Taylor Black is a 52 y.o. year-old female with a history of CAD presenting to the ED with chief complaint of code stroke.  At about midnight patient called out yelling for her daughter.  Daughter immediately noticed that something was not right.  Weakness to the right side, not talking normally.  Review of Systems  A thorough review of systems was obtained and all systems are negative except as noted in the HPI and PMH.   Patient's Health History    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  06/13/2019   Chicken pox    Depression    GERD (gastroesophageal reflux disease)    LV dysfunction  06/13/2019    Past Surgical History:  Procedure Laterality Date   BIOPSY  05/29/2020   Procedure: BIOPSY;  Surgeon: Shellia Cleverly, DO;  Location: [facility];  Service: Gastroenterology;;   bowel obstruction  1989   COLONOSCOPY WITH PROPOFOL N/A 05/29/2020   Procedure: COLONOSCOPY WITH PROPOFOL;  Surgeon: Shellia Cleverly, DO;  Location: [facility];  Service: Gastroenterology;  Laterality: N/A;   ESOPHAGOGASTRODUODENOSCOPY (EGD) WITH PROPOFOL N/A 05/29/2020   Procedure: ESOPHAGOGASTRODUODENOSCOPY (EGD) WITH PROPOFOL;  Surgeon: Shellia Cleverly, DO;  Location[facility];  Service: Gastroenterology;  Laterality: N/A;   LEFT HEART CATH AND CORONARY ANGIOGRAPHY N/A 06/13/2019   Procedure: LEFT HEART CATH AND CORONARY ANGIOGRAPHY;  Surgeon: Lyn Records, MD;  Location: [facility];  Service: Cardiovascular;  Laterality: N/A;   POLYPECTOMY  05/29/2020   Procedure: POLYPECTOMY;  Surgeon: Shellia Cleverly, DO;  Location[facility];  Service: Gastroenterology;;   UMBILICAL HERNIA REPAIR  2017    Family History  Problem  Relation Age of Onset   Heart disease Father    Stroke Father    Hypertension Father    Kidney disease Father    Diabetes Father    Alcohol abuse Father    Depression Paternal Grandmother    Kidney disease Paternal Grandmother    Cancer Maternal Grandmother        Cervical    Social History   Socioeconomic History   Marital status: Legally Separated    Spouse name: Not on file   Number of children: Not on file   Years of education: Not on file   Highest education level: Not on file  Occupational History   Not on file  Tobacco Use   Smoking status: Every Day   Smokeless tobacco: Never  Vaping Use   Vaping status: Never Used  Substance and Sexual Activity   Alcohol use: Yes    Alcohol/week: 3.0 standard drinks of alcohol    Types: 3 Standard drinks or equivalent per week   Drug use: Yes    Types: Marijuana   Sexual activity: Not Currently    Comment: 1st intercourse 52 yo-5 partners  Other Topics Concern   Not on file  Social History Narrative   Not on file   Social Determinants of Health   Financial Resource Strain: Not on file  Food Insecurity: Not on file  Transportation Needs: Not on file  Physical Activity: Not on file  Stress: Not on file  Social Connections: Not on file  Intimate Partner Violence: Not on file  Physical Exam   Vitals:   06/26/23 0417 06/26/23 0430  BP: (!) 145/88 108/84  Pulse: 91 88  Resp: 14 18  Temp:    SpO2: 96% 96%    CONSTITUTIONAL: ill-appearing, NAD NEURO/PSYCH: Awake and alert, follows commands but nonverbal, significant weakness to right face, arm, leg EYES:  eyes equal and reactive ENT/NECK:  no LAD, no JVD CARDIO: Regular rate, well-perfused, normal S1 and S2 PULM:  CTAB no wheezing or rhonchi GI/GU:  non-distended, non-tender MSK/SPINE:  No gross deformities, no edema SKIN:  no rash, atraumatic   *Additional and/or pertinent findings included in MDM below  Diagnostic and Interventional Summary    EKG  Interpretation Date/Time:  Friday June 26 2023 03:36:36 EDT Ventricular Rate:  93 PR Interval:  172 QRS Duration:  107 QT Interval:  375 QTC Calculation: 467 R Axis:   -10  Text Interpretation: Sinus rhythm Confirmed by Kennis Carina [phone]) on 06/26/2023 4:57:30 AM       Labs Reviewed  APTT - Abnormal; Notable for the following components:      Result Value   aPTT 21 (*)    All other components within normal limits  CBC - Abnormal; Notable for the following components:   MCH 25.3 (*)    All other components within normal limits  COMPREHENSIVE METABOLIC PANEL - Abnormal; Notable for the following components:   Glucose, Bld 174 (*)    Calcium 8.4 (*)    All other components within normal limits  ETHANOL - Abnormal; Notable for the following components:   Alcohol, Ethyl (B) 64 (*)    All other components within normal limits  I-STAT CHEM 8, ED - Abnormal; Notable for the following components:   Glucose, Bld 168 (*)    Calcium, Ion 1.08 (*)    All other components within normal limits  CBG MONITORING, ED - Abnormal; Notable for the following components:   Glucose-Capillary 150 (*)    All other components within normal limits  PROTIME-INR  DIFFERENTIAL  HIV ANTIBODY (ROUTINE TESTING W REFLEX)  LIPID PANEL  HEMOGLOBIN A1C  HCG, SERUM, QUALITATIVE  HCG, QUANTITATIVE, PREGNANCY    CT ANGIO HEAD NECK W WO CM (CODE STROKE)  Final Result    CT HEAD CODE STROKE WO CONTRAST  Final Result    MR BRAIN WO CONTRAST    (Results Pending)    Medications  sodium chloride flush (NS) 0.9 % injection 3 mL (3 mLs Intravenous Not Given 06/26/23 0355)  labetalol (NORMODYNE) injection 20 mg (10 mg Intravenous Given 06/26/23 0316)    And  clevidipine (CLEVIPREX) infusion 0.5 mg/mL (has no administration in time range)   stroke: early stages of recovery book (has no administration in time range)  0.9 %  sodium chloride infusion ( Intravenous New Bag/Given 06/26/23 0356)  acetaminophen  (TYLENOL) tablet 650 mg (650 mg Oral Given 06/26/23 0404)    Or  acetaminophen (TYLENOL) 160 MG/5ML solution 650 mg ( Per Tube See Alternative 06/26/23 0404)    Or  acetaminophen (TYLENOL) suppository 650 mg ( Rectal See Alternative 06/26/23 0404)  senna-docusate (Senokot-S) tablet 1 tablet (has no administration in time range)  pantoprazole (PROTONIX) injection 40 mg (has no administration in time range)  labetalol (NORMODYNE) injection 20 mg (20 mg Intravenous Not Given 06/26/23 0357)    And  clevidipine (CLEVIPREX) infusion 0.5 mg/mL (has no administration in time range)  tenecteplase (TNKASE) injection for Stroke 22 mg (22 mg Intravenous Given 06/26/23 0317)  iohexol (OMNIPAQUE) 350 MG/ML  injection 75 mL (75 mLs Intravenous Contrast Given 06/26/23 0326)     Procedures  /  Critical Care .Critical Care  Performed by: Sabas Sous, MD Authorized by: Sabas Sous, MD   Critical care provider statement:    Critical care time (minutes):  35   Critical care was necessary to treat or prevent imminent or life-threatening deterioration of the following conditions: Acute ischemic stroke.   Critical care was time spent personally by me on the following activities:  Development of treatment plan with patient or surrogate, discussions with consultants, evaluation of patient's response to treatment, examination of patient, ordering and review of laboratory studies, ordering and review of radiographic studies, ordering and performing treatments and interventions, pulse oximetry, re-evaluation of patient's condition and review of old charts   ED Course and Medical Decision Making  Initial Impression and Ddx Exam is highly concerning for acute ischemic stroke, possibly LVO given the aphasia.  Patient quickly brought to CT, no signs of bleeding on CT head.  Neurology at bedside.  Patient given labetalol for blood pressure control, tenecteplase, Cleviprex.  To be admitted to neuro ICU for  monitoring.  Past medical/surgical history that increases complexity of ED encounter: CAD  Interpretation of Diagnostics I personally reviewed the EKG and my interpretation is as follows:  SR  Labs without significant blood count or electrolyte disturbance.  Patient Reassessment and Ultimate Disposition/Management     Neuro ICU admission.  On reassessment patient seems to be improving.  Patient management required discussion with the following services or consulting groups:  Neurology  Complexity of Problems Addressed Acute illness or injury that poses threat of life of bodily function  Additional Data Reviewed and Analyzed Further history obtained from: Further history from spouse/family member  Additional Factors Impacting ED Encounter Risk Consideration of hospitalization  Elmer Sow. Pilar Plate, MD [facility] Emergency Medicine [facility] [email or web address]  Final Clinical Impressions(s) / ED Diagnoses     ICD-10-CM   1. Acute ischemic stroke [facility])  I63.9       ED Discharge Orders     None        Discharge Instructions Discussed with and Provided to Patient:   Discharge Instructions   None      Sabas Sous, MD 06/26/23 [phone]

## 2023-06-26 NOTE — ED Notes (Signed)
One of pt's TNK reassessment was missed due to pt being in MRI

## 2023-06-27 ENCOUNTER — Inpatient Hospital Stay (HOSPITAL_COMMUNITY): Payer: Self-pay

## 2023-06-27 DIAGNOSIS — I639 Cerebral infarction, unspecified: Secondary | ICD-10-CM

## 2023-06-27 MED ORDER — ENOXAPARIN SODIUM 40 MG/0.4ML IJ SOSY
40.0000 mg | PREFILLED_SYRINGE | Freq: Every day | INTRAMUSCULAR | Status: DC
  Administered 2023-06-27 – 2023-06-29 (×3): 40 mg via SUBCUTANEOUS
  Filled 2023-06-27 (×3): qty 0.4

## 2023-06-27 MED ORDER — ASPIRIN 81 MG PO TBEC
81.0000 mg | DELAYED_RELEASE_TABLET | Freq: Every day | ORAL | Status: DC
  Administered 2023-06-27 – 2023-06-29 (×3): 81 mg via ORAL
  Filled 2023-06-27 (×3): qty 1

## 2023-06-27 MED ORDER — CLOPIDOGREL BISULFATE 75 MG PO TABS
75.0000 mg | ORAL_TABLET | Freq: Every day | ORAL | Status: DC
  Administered 2023-06-27 – 2023-06-29 (×3): 75 mg via ORAL
  Filled 2023-06-27 (×3): qty 1

## 2023-06-27 MED ORDER — CLOPIDOGREL BISULFATE 75 MG PO TABS
75.0000 mg | ORAL_TABLET | Freq: Every day | ORAL | Status: DC
Start: 2023-06-27 — End: 2023-06-27

## 2023-06-27 MED ORDER — PANTOPRAZOLE SODIUM 40 MG PO TBEC
40.0000 mg | DELAYED_RELEASE_TABLET | Freq: Every day | ORAL | Status: DC
  Administered 2023-06-28 – 2023-06-29 (×2): 40 mg via ORAL
  Filled 2023-06-27 (×2): qty 1

## 2023-06-27 NOTE — Progress Notes (Signed)
STROKE TEAM PROGRESS NOTE   BRIEF HPI Taylor Black is a 52 y.o. female past medical history of depression, hypertension, hypertensive cardiomyopathy, nonobstructive CAD, not on any medications presenting to the emergency department for sudden onset of right-sided weakness and speech difficulties. NIHSS 12 on arrival. Received TNK.    SIGNIFICANT HOSPITAL EVENTS 8/23 presented to ED code stroke. Received TNK  INTERIM HISTORY/SUBJECTIVE Patient was seen and evaluated this morning, reports that she is feeling much better. Actively working with PT. No complaints. Pending TEE and loop recorder scheduled for Monday     OBJECTIVE  CBC    Component Value Date/Time   WBC 6.2 06/26/2023 0307   RBC 4.87 06/26/2023 0307   HGB 13.6 06/26/2023 0314   HCT 40.0 06/26/2023 0314   PLT 312 06/26/2023 0307   MCV 82.1 06/26/2023 0307   MCH 25.3 (L) 06/26/2023 0307   MCHC 30.8 06/26/2023 0307   RDW 14.7 06/26/2023 0307   LYMPHSABS 1.8 06/26/2023 0307   MONOABS 0.6 06/26/2023 0307   EOSABS 0.1 06/26/2023 0307   BASOSABS 0.0 06/26/2023 0307    BMET    Component Value Date/Time   NA 142 06/26/2023 0314   K 4.2 06/26/2023 0314   CL 106 06/26/2023 0314   CO2 22 06/26/2023 0307   GLUCOSE 168 (H) 06/26/2023 0314   BUN 12 06/26/2023 0314   CREATININE 0.90 06/26/2023 0314   CALCIUM 8.4 (L) 06/26/2023 0307   GFRNONAA >60 06/26/2023 0307    IMAGING past 24 hours CT HEAD WO CONTRAST ( )  Result Date: 06/27/2023 CLINICAL DATA:  Stroke follow-up EXAM: CT HEAD WITHOUT CONTRAST TECHNIQUE: Contiguous axial images were obtained from the base of the skull through the vertex without intravenous contrast. RADIATION DOSE REDUCTION: This exam was performed according to the departmental dose-optimization program which includes automated exposure control, adjustment of the mA and/or kV according to patient size and/or use of iterative reconstruction technique. COMPARISON:  06/26/2023 FINDINGS: Brain:  There is no mass, hemorrhage or extra-axial collection. The size and configuration of the ventricles and extra-axial CSF spaces are normal. The brain parenchyma is normal, without acute or chronic infarction. Vascular: No abnormal hyperdensity of the major intracranial arteries or dural venous sinuses. No intracranial atherosclerosis. Skull: The visualized skull base, calvarium and extracranial soft tissues are normal. Sinuses/Orbits: No fluid levels or advanced mucosal thickening of the visualized paranasal sinuses. No mastoid or middle ear effusion. The orbits are normal. IMPRESSION: Normal head CT. Electronically Signed   By: Deatra Robinson M.D.   On: 06/27/2023 03:08   ECHOCARDIOGRAM COMPLETE BUBBLE STUDY  Result Date: 06/26/2023    ECHOCARDIOGRAM REPORT   Patient Name:   Taylor Black Date of Exam: 06/26/2023 Medical Rec #:  [id number]               Height:       60.0 in Accession #:    [id number]              Weight:       196.2 lb Date of Birth:  [birth year]               BSA:          1.851 m Patient Age:    52 years                BP:           132/91 mmHg Patient Gender: F  HR:           72 bpm. Exam Location:  Inpatient Procedure: 2D Echo, Color Doppler, Cardiac Doppler and Saline Contrast Bubble            Study Indications:    Stroke  History:        Patient has prior history of Echocardiogram examinations, most                 recent 12/14/2019. Dilated Cardiomyopathy, CAD; Risk                 Factors:Current Smoker, Hypertension and Dyslipidemia. GERD.  Sonographer:    Raeford Razor RDCS Referring Phys: [id number] ASHISH ARORA IMPRESSIONS  1. Left ventricular ejection fraction, by estimation, is 60 to 65%. The left ventricle has normal function. The left ventricle has no regional wall motion abnormalities. Left ventricular diastolic parameters are consistent with Grade I diastolic dysfunction (impaired relaxation).  2. Right ventricular systolic function is normal. The right  ventricular size is normal.  3. Left atrial size was mildly dilated.  4. The mitral valve is normal in structure. Trivial mitral valve regurgitation. No evidence of mitral stenosis.  5. The aortic valve is tricuspid. There is mild calcification of the aortic valve. Aortic valve regurgitation is trivial. No aortic stenosis is present.  6. The inferior vena cava is dilated in size with >50% respiratory variability, suggesting right atrial pressure of 8 mmHg.  7. Agitated saline contrast bubble study was negative, with no evidence of any interatrial shunt. FINDINGS  Left Ventricle: Left ventricular ejection fraction, by estimation, is 60 to 65%. The left ventricle has normal function. The left ventricle has no regional wall motion abnormalities. The left ventricular internal cavity size was normal in size. There is  no left ventricular hypertrophy. Left ventricular diastolic parameters are consistent with Grade I diastolic dysfunction (impaired relaxation). Right Ventricle: The right ventricular size is normal. No increase in right ventricular wall thickness. Right ventricular systolic function is normal. Left Atrium: Left atrial size was mildly dilated. Right Atrium: Right atrial size was normal in size. Pericardium: There is no evidence of pericardial effusion. Mitral Valve: The mitral valve is normal in structure. Trivial mitral valve regurgitation. No evidence of mitral valve stenosis. Tricuspid Valve: The tricuspid valve is normal in structure. Tricuspid valve regurgitation is trivial. No evidence of tricuspid stenosis. Aortic Valve: The aortic valve is tricuspid. There is mild calcification of the aortic valve. Aortic valve regurgitation is trivial. No aortic stenosis is present. Aortic valve peak gradient measures 10.5 mmHg. Pulmonic Valve: The pulmonic valve was normal in structure. Pulmonic valve regurgitation is not visualized. No evidence of pulmonic stenosis. Aorta: The aortic root is normal in size and  structure. Venous: The inferior vena cava is dilated in size with greater than 50% respiratory variability, suggesting right atrial pressure of 8 mmHg. IAS/Shunts: No atrial level shunt detected by color flow Doppler. Agitated saline contrast was given intravenously to evaluate for intracardiac shunting. Agitated saline contrast bubble study was negative, with no evidence of any interatrial shunt.  LEFT VENTRICLE PLAX 2D LVIDd:         4.40 cm      Diastology LVIDs:         2.80 cm      LV e' medial:    6.74 cm/s LV PW:         0.80 cm      LV E/e' medial:  12.4 LV IVS:  0.80 cm      LV e' lateral:   8.05 cm/s LVOT diam:     2.00 cm      LV E/e' lateral: 10.4 LV SV:         67 LV SV Index:   36 LVOT Area:     3.14 cm  LV Volumes (MOD) LV vol d, MOD A2C: 164.0 ml LV vol d, MOD A4C: 115.0 ml LV vol s, MOD A2C: 57.6 ml LV vol s, MOD A4C: 42.1 ml LV SV MOD A2C:     106.4 ml LV SV MOD A4C:     115.0 ml LV SV MOD BP:      89.0 ml RIGHT VENTRICLE             IVC RV Basal diam:  2.20 cm     IVC diam: 2.10 cm RV S prime:     12.40 cm/s TAPSE (M-mode): 3.1 cm LEFT ATRIUM             Index        RIGHT ATRIUM           Index LA diam:        3.60 cm 1.94 cm/m   RA Area:     13.80 cm LA Vol (A2C):   61.4 ml 33.16 ml/m  RA Volume:   33.10 ml  17.88 ml/m LA Vol (A4C):   51.7 ml 27.92 ml/m LA Biplane Vol: 57.4 ml 31.00 ml/m  AORTIC VALVE AV Area (Vmax): 2.02 cm AV Vmax:        162.00 cm/s AV Peak Grad:   10.5 mmHg LVOT Vmax:      104.00 cm/s LVOT Vmean:     66.600 cm/s LVOT VTI:       0.212 m  AORTA Ao Root diam: 2.80 cm Ao Asc diam:  3.30 cm MITRAL VALVE MV Area (PHT): 4.06 cm    SHUNTS MV Decel Time: 187 msec    Systemic VTI:  0.21 m MV E velocity: 83.60 cm/s  Systemic Diam: 2.00 cm MV A velocity: 99.80 cm/s MV E/A ratio:  0.84 Arvilla Meres MD Electronically signed by Arvilla Meres MD Signature Date/Time: 06/26/2023/12:49:19 PM    Final     Vitals:   06/27/23 0900 06/27/23 1000 06/27/23 1005 06/27/23 1010   BP: (!) 159/83 (!) 198/162 (!) 180/112 (!) 159/90  Pulse: (!) 59 (!) 58 (!) 57 (!) 53  Resp:      Temp:      TempSrc:      SpO2: 97% 98% 98% 96%  Weight:      Height:         PHYSICAL EXAM General:  Alert, well-nourished, well-developed patient in no acute distress Psych:  Mood and affect appropriate for situation CV: Regular rate and rhythm on monitor Respiratory:  Regular, unlabored respirations on room air GI: Abdomen soft and nontender   NEURO:  Mental Status: AA&Ox3, patient is able to give clear and coherent history Speech/Language: speech is without dysarthria or aphasia.  Naming, repetition, fluency, and comprehension intact.  Cranial Nerves:  II: PERRL. Visual fields full.  III, IV, VI: EOMI. Eyelids elevate symmetrically.  V: Sensation is intact to light touch and symmetrical to face.  VII: Face is symmetrical resting and smiling VIII: hearing intact to voice. IX, X: Palate elevates symmetrically. Phonation is normal.  LO:VFIEPPIR shrug 5/5. XII: tongue is midline without fasciculations. Motor: 5/5 strength to all muscle groups tested.  Tone: is normal and  bulk is normal Sensation- Intact to light touch bilaterally. Extinction absent to light touch to DSS.   Coordination: FTN intact bilaterally, HKS: no ataxia in BLE.No drift.  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 ASPECTS 10.    CTA head & neck no LVO or significant stenosis  MRI  Multiple small acute/early subacute infarcts within the left MCA territory and MCA/PCA watershed territory. Possible additional small acute left ACA territory infarct within the posterior left frontal lobe.  Repeat CT (s/p 24hr TNK) Stable, no hemorrhage  2D Echo EF 60-65%. Negative bubble. No  intracardiac source of emboli detected.  TEE scheduled Monday Consider loop recorder if workup negative LDL 98 HgbA1c 6.1 VTE prophylaxis - SCDs, s/p TNK <24hr No antithrombotic prior to admission, now on aspirin 81 mg daily and clopidogrel 75 mg daily s/p TNK (Repeat head CT negative for hemorrhage)  Therapy recommendations:  no PT f/u Disposition:  pending  History of dilated cardiomyopathy Last follow-up with Dr. Eden Emms in 12/2019 Hospitalized August 2020 with chest pain. Cath done 06/13/19 with no obstructive CAD Estimate EF 40-45% with global hypokinesis Started on diuretic, ARB and coreg.  Now EF 60 to 50% TEE scheduled Monday Consider loop recorder if workup negative  Hypertension Home meds:  losartan 100 once daily, carvedilol 25 twice daily Stable, not requiring clevidipine BP goal less than 180/105 post TNK Long-term BP goal normotensive  Hyperlipidemia Home meds:  atorvastatin 20 mg once daily, resumed in hospital LDL 98, goal < 70 Increased atorvastatin from 20 mg to 40 mg Continue statin at discharge  Tobacco Abuse Patient smokes 1 packs per day for unknown number of years reports currently cutting down.  Nicotine replacement therapy provided - nicotine patch 14 mg once daily as needed TOC consult for nicotine cessation  Alcohol use Reported 4-5 drinks several times a week ETOH use, alcohol level 64, advised to drink no more than 1 drink a day No signs of alcohol withdrawal on exam Due to concern for underreporting of alcohol use, start CIWA protocol and thiamine, FA and multivitamin supplemention  Other Stroke Risk Factors Obesity, Body mass index is 38.32 kg/m., BMI >/= 30 associated with increased stroke risk, recommend weight loss, diet and exercise as appropriate  Family hx stroke (father)  Other Active Problems Insomnia, restarted trazadone 50 mg once at night Uninsured, [facility] consult  Hospital day # 1      Windell Norfolk, MD  Stroke  Neurology 06/27/2023 11:40 AM  I, the attending neurologist, have personally obtained a history, examined the patient, evaluated laboratory data, individually viewed imaging studies and agree with radiology interpretations. Together with the NP/PA, we formulated the assessment and plan of care which reflects our mutual decision.  I have made any additions or clarifications directly to the above note and agree with the findings and plan as currently documented. I spent a total of 30 minutes in the care of this patient.    To contact Stroke Continuity provider, please refer to [email or web address]. After hours, contact General Neurology

## 2023-06-27 NOTE — Evaluation (Signed)
Occupational Therapy Evaluation Patient Details Name: Taylor Black MRN: [id number] DOB: Feb 18, 1971 Today's Date: 06/27/2023   History of Present Illness Pt is 52 yo presenting via EMS to [facility] due to R sided weakness and dysarthria. 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  Pt was given TNKase and admitted. PMH: depression, HTN, Hypertensive cardiomyopathy, nonobstructive CAD.   Clinical Impression   Pt reports ind at baseline with ADLs/functional mobility, lives with daughter and grandchildren and was working as a Public house manager for [employer]. Pt currently close to baseline, needing set up -CGA for ADLs, ind with bed mobility, and CGA for transfers without AD. BP reading 180/112 (129) during session, further mobility deferred, RN notified. Pt with decr cognition and mild weakness/coordination deficits in RUE. Pt presenting with impairments listed below, will follow acutely. Recommend OP OT at d/c.       If plan is discharge home, recommend the following: A little help with walking and/or transfers;A little help with bathing/dressing/bathroom;Direct supervision/assist for medications management;Direct supervision/assist for financial management    Functional Status Assessment  Patient has had a recent decline in their functional status and demonstrates the ability to make significant improvements in function in a reasonable and predictable amount of time.  Equipment Recommendations  None recommended by OT    Recommendations for Other Services       Precautions / Restrictions Precautions Precautions: None Restrictions Weight Bearing Restrictions: No      Mobility Bed Mobility Overal bed mobility: Independent                  Transfers Overall transfer level: Needs assistance Equipment used: None Transfers: Sit to/from Stand Sit to Stand: Contact guard assist                  Balance Overall balance assessment: No apparent  balance deficits (not formally assessed)                                         ADL either performed or assessed with clinical judgement   ADL Overall ADL's : Needs assistance/impaired Eating/Feeding: Set up   Grooming: Set up   Upper Body Bathing: Set up   Lower Body Bathing: Set up   Upper Body Dressing : Set up   Lower Body Dressing: Set up   Toilet Transfer: Contact guard assist   Toileting- Clothing Manipulation and Hygiene: Contact guard assist       Functional mobility during ADLs: Contact guard assist       Vision Baseline Vision/History: 1 Wears glasses (wears glasses for reading only) Vision Assessment?: No apparent visual deficits;Yes Eye Alignment: Within Functional Limits Ocular Range of Motion: Within Functional Limits Tracking/Visual Pursuits: Able to track stimulus in all quads without difficulty Additional Comments: able to read short paragraph in booklet with reading glasses donned     Perception Perception: Not tested       Praxis Praxis: Not tested       Pertinent Vitals/Pain Pain Assessment Pain Assessment: Faces Pain Score: 2  Faces Pain Scale: Hurts a little bit Pain Location: R UE Pain Descriptors / Indicators: Aching Pain Intervention(s): Limited activity within patient's tolerance, Monitored during session, Repositioned     Extremity/Trunk Assessment Upper Extremity Assessment Upper Extremity Assessment: Right hand dominant;RUE deficits/detail RUE Deficits / Details: 4/5 strength, decr coordination   Lower Extremity Assessment Lower Extremity  Assessment: Defer to PT evaluation   Cervical / Trunk Assessment Cervical / Trunk Assessment: Normal   Communication Communication Communication: No apparent difficulties   Cognition Arousal: Alert Behavior During Therapy: WFL for tasks assessed/performed Overall Cognitive Status: No family/caregiver present to determine baseline cognitive functioning                                  General Comments: decr short term memory, able to recall 2/3 words after a few mins. Reports frequently losing her train of thought     General Comments  BP 180/112 (129) during session, RN notified    Exercises     Shoulder Instructions      Home Living Family/patient expects to be discharged to:: Private residence Living Arrangements: Children;Other relatives Available Help at Discharge: Family;Available PRN/intermittently Type of Home: Mobile home Home Access: Stairs to enter Entrance Stairs-Number of Steps: 4 Entrance Stairs-Rails: Left Home Layout: One level     Bathroom Shower/Tub: Producer, television/film/video: Standard Bathroom Accessibility: No   Home Equipment: None   Additional Comments: 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      Prior Functioning/Environment Prior Level of Function : Independent/Modified Independent;Driving;Working/employed             Mobility Comments: Ambulatory without AD ADLs Comments: Independent        OT Problem List: Decreased strength;Decreased range of motion;Decreased activity tolerance;Impaired balance (sitting and/or standing);Decreased cognition      OT Treatment/Interventions: Self-care/ADL training;Therapeutic exercise;Energy conservation;DME and/or AE instruction;Therapeutic activities;Balance training;Patient/family education;Cognitive remediation/compensation;Visual/perceptual remediation/compensation    OT Goals(Current goals can be found in the care plan section) Acute Rehab OT Goals Patient Stated Goal: none stated OT Goal Formulation: With patient Time For Goal Achievement: 07/11/23 Potential to Achieve Goals: Good ADL Goals Pt Will Perform Upper Body Dressing: Independently;sitting Pt Will Perform Lower Body Dressing: Independently;sit to/from stand;sitting/lateral leans Pt Will Transfer to Toilet: Independently;ambulating;regular height  toilet Pt/caregiver will Perform Home Exercise Program: Increased ROM;Increased strength;Right Upper extremity;With written HEP provided;With Supervision Additional ADL Goal #1: pt will be able to follow 4 step trailmaking task in prep for ADLs  OT Frequency: Min 1X/week    Co-evaluation              AM-PAC OT "6 Clicks" Daily Activity     Outcome Measure Help from another person eating meals?: None Help from another person taking care of personal grooming?: A Little Help from another person toileting, which includes using toliet, bedpan, or urinal?: A Little Help from another person bathing (including washing, rinsing, drying)?: A Little Help from another person to put on and taking off regular upper body clothing?: A Little Help from another person to put on and taking off regular lower body clothing?: A Little 6 Click Score: 19   End of Session Nurse Communication: Mobility status (BP)  Activity Tolerance: Patient tolerated treatment well Patient left: with call bell/phone within reach;in bed  OT Visit Diagnosis: Unsteadiness on feet (R26.81);Other abnormalities of gait and mobility (R26.89);Muscle weakness (generalized) (M62.81)                Time: 1761-6073 OT Time Calculation (min): 20 min Charges:  OT General Charges $OT Visit: 1 Visit OT Evaluation $OT Eval Moderate Complexity: 1 Mod  Nini Cavan K, OTD, OTR/L SecureChat Preferred Acute Rehab [phone]   Francois Elk K Koonce 06/27/2023, 10:21 AM

## 2023-06-27 NOTE — Plan of Care (Signed)
Problem: Education: Goal: Knowledge of disease or condition will improve 06/27/2023 0339 by Raina Mina, RN Outcome: Progressing 06/27/2023 0338 by Raina Mina, RN Outcome: Progressing Goal: Knowledge of secondary prevention will improve (MUST DOCUMENT ALL) 06/27/2023 0339 by Raina Mina, RN Outcome: Progressing 06/27/2023 0338 by Raina Mina, RN Outcome: Progressing Goal: Knowledge of patient specific risk factors will improve Loraine Leriche N/A or DELETE if not current risk factor) 06/27/2023 0339 by Raina Mina, RN Outcome: Progressing 06/27/2023 0338 by Raina Mina, RN Outcome: Progressing   Problem: Ischemic Stroke/TIA Tissue Perfusion: Goal: Complications of ischemic stroke/TIA will be minimized 06/27/2023 1308 by Raina Mina, RN Outcome: Progressing 06/27/2023 0338 by Raina Mina, RN Outcome: Progressing   Problem: Coping: Goal: Will verbalize positive feelings about self 06/27/2023 0339 by Raina Mina, RN Outcome: Progressing 06/27/2023 0338 by Raina Mina, RN Outcome: Progressing Goal: Will identify appropriate support needs 06/27/2023 0339 by Raina Mina, RN Outcome: Progressing 06/27/2023 0338 by Raina Mina, RN Outcome: Progressing   Problem: Health Behavior/Discharge Planning: Goal: Ability to manage health-related needs will improve 06/27/2023 0339 by Raina Mina, RN Outcome: Progressing 06/27/2023 0338 by Raina Mina, RN Outcome: Progressing Goal: Goals will be collaboratively established with patient/family 06/27/2023 [phone] by Raina Mina, RN Outcome: Progressing 06/27/2023 0338 by Raina Mina, RN Outcome: Progressing   Problem: Self-Care: Goal: Ability to participate in self-care as condition permits will improve 06/27/2023 0339 by Raina Mina, RN Outcome: Progressing 06/27/2023 0338 by Raina Mina, RN Outcome: Progressing Goal: Verbalization of feelings and concerns over difficulty with self-care will  improve 06/27/2023 0339 by Raina Mina, RN Outcome: Progressing 06/27/2023 0338 by Raina Mina, RN Outcome: Progressing Goal: Ability to communicate needs accurately will improve 06/27/2023 0339 by Raina Mina, RN Outcome: Progressing 06/27/2023 0338 by Raina Mina, RN Outcome: Progressing   Problem: Nutrition: Goal: Risk of aspiration will decrease 06/27/2023 0339 by Raina Mina, RN Outcome: Progressing 06/27/2023 0338 by Raina Mina, RN Outcome: Progressing Goal: Dietary intake will improve 06/27/2023 0339 by Raina Mina, RN Outcome: Progressing 06/27/2023 0338 by Raina Mina, RN Outcome: Progressing   Problem: Education: Goal: Knowledge of General Education information will improve Description: Including pain rating scale, medication(s)/side effects and non-pharmacologic comfort measures 06/27/2023 0339 by Raina Mina, RN Outcome: Progressing 06/27/2023 0338 by Raina Mina, RN Outcome: Progressing   Problem: Health Behavior/Discharge Planning: Goal: Ability to manage health-related needs will improve 06/27/2023 0339 by Raina Mina, RN Outcome: Progressing 06/27/2023 0338 by Raina Mina, RN Outcome: Progressing   Problem: Clinical Measurements: Goal: Ability to maintain clinical measurements within normal limits will improve 06/27/2023 0339 by Raina Mina, RN Outcome: Progressing 06/27/2023 0338 by Raina Mina, RN Outcome: Progressing Goal: Will remain free from infection 06/27/2023 0339 by Raina Mina, RN Outcome: Progressing 06/27/2023 0338 by Raina Mina, RN Outcome: Progressing Goal: Diagnostic test results will improve 06/27/2023 0339 by Raina Mina, RN Outcome: Progressing 06/27/2023 0338 by Raina Mina, RN Outcome: Progressing Goal: Respiratory complications will improve 06/27/2023 0339 by Raina Mina, RN Outcome: Progressing 06/27/2023 0338 by Raina Mina, RN Outcome: Progressing Goal: Cardiovascular  complication will be avoided 06/27/2023 0339 by Raina Mina, RN Outcome: Progressing 06/27/2023 0338 by Raina Mina, RN Outcome: Progressing   Problem: Activity: Goal: Risk for activity intolerance will decrease 06/27/2023 0339 by Raina Mina, RN Outcome: Progressing  06/27/2023 4098 by Raina Mina, RN Outcome: Progressing   Problem: Nutrition: Goal: Adequate nutrition will be maintained 06/27/2023 0339 by Raina Mina, RN Outcome: Progressing 06/27/2023 0338 by Raina Mina, RN Outcome: Progressing   Problem: Coping: Goal: Level of anxiety will decrease 06/27/2023 0339 by Raina Mina, RN Outcome: Progressing 06/27/2023 0338 by Raina Mina, RN Outcome: Progressing   Problem: Elimination: Goal: Will not experience complications related to bowel motility 06/27/2023 0339 by Raina Mina, RN Outcome: Progressing 06/27/2023 0338 by Raina Mina, RN Outcome: Progressing Goal: Will not experience complications related to urinary retention 06/27/2023 0339 by Raina Mina, RN Outcome: Progressing 06/27/2023 0338 by Raina Mina, RN Outcome: Progressing   Problem: Pain Managment: Goal: General experience of comfort will improve 06/27/2023 0339 by Raina Mina, RN Outcome: Progressing 06/27/2023 0338 by Raina Mina, RN Outcome: Progressing   Problem: Safety: Goal: Ability to remain free from injury will improve 06/27/2023 0339 by Raina Mina, RN Outcome: Progressing 06/27/2023 0338 by Raina Mina, RN Outcome: Progressing   Problem: Skin Integrity: Goal: Risk for impaired skin integrity will decrease 06/27/2023 0339 by Raina Mina, RN Outcome: Progressing 06/27/2023 0338 by Raina Mina, RN Outcome: Progressing

## 2023-06-27 NOTE — Progress Notes (Signed)
   [facility] has been requested to perform a transesophageal echocardiogram on Taylor Black for stoke.  After careful review of history and examination, the risks and benefits of transesophageal echocardiogram have been explained including risks of esophageal damage, perforation (1:10,000 risk), bleeding, pharyngeal hematoma as well as other potential complications associated with conscious sedation including aspiration, arrhythmia, respiratory failure and death. Alternatives to treatment were discussed, questions were answered. Patient is willing to proceed.   Scheduled for Monday, NPO at [place] please  Marcelino Duster, PA  06/27/2023 1:50 PM

## 2023-06-27 NOTE — Plan of Care (Signed)
  Problem: Education: Goal: Knowledge of disease or condition will improve Outcome: Progressing   Problem: Coping: Goal: Will verbalize positive feelings about self Outcome: Progressing Goal: Will identify appropriate support needs Outcome: Progressing   Problem: Health Behavior/Discharge Planning: Goal: Ability to manage health-related needs will improve Outcome: Progressing Goal: Goals will be collaboratively established with patient/family Outcome: Progressing   Problem: Self-Care: Goal: Ability to communicate needs accurately will improve Outcome: Progressing   Problem: Nutrition: Goal: Risk of aspiration will decrease Outcome: Progressing Goal: Dietary intake will improve Outcome: Progressing   Problem: Education: Goal: Knowledge of General Education information will improve Description: Including pain rating scale, medication(s)/side effects and non-pharmacologic comfort measures Outcome: Progressing

## 2023-06-27 NOTE — Progress Notes (Signed)
Patient received from ICU, alert and orientedX4, vital signs stable, tele box connected, call bell within reach, will continue to monitor

## 2023-06-27 NOTE — Evaluation (Signed)
Speech Language Pathology Evaluation Patient Details Name: Armony Weinreich Talamantes MRN: [id number] DOB: 10-03-1971 Today's Date: 06/27/2023 Time: 2595-6387 SLP Time Calculation (min) (ACUTE ONLY): 24 min  Problem List:  Patient Active Problem List   Diagnosis Date Noted   Acute ischemic stroke (HCC) 06/26/2023   Polyp of transverse colon    Polyp of sigmoid colon    Polyp of rectum    Grade II hemorrhoids    Gastritis and gastroduodenitis    Hiatal hernia    Iron deficiency anemia    Diverticulosis of colon without hemorrhage    GI bleeding 05/26/2020   Unstable angina (HCC) due to hypertension  06/13/2019   HTN (hypertension) 06/13/2019   Tobacco use disorder 06/13/2019   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 06/13/19  06/13/2019   LV dysfunction  06/13/2019   NSTEMI (non-ST elevated myocardial infarction) (HCC) 06/12/2019   Knee pain, right 06/05/2017   Depression, major, single episode, mild (HCC) 06/05/2017   GERD (gastroesophageal reflux disease) 06/05/2017   Obesity, morbid, BMI 40.0-49.9 (HCC) 06/05/2017   Past Medical History:  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  06/13/2019   Chicken pox    Depression    GERD (gastroesophageal reflux disease)    LV dysfunction  06/13/2019   Past Surgical History:  Past Surgical History:  Procedure Laterality Date   BIOPSY  05/29/2020   Procedure: BIOPSY;  Surgeon: Shellia Cleverly, DO;  Location: [facility];  Service: Gastroenterology;;   bowel obstruction  1989   COLONOSCOPY WITH PROPOFOL N/A 05/29/2020   Procedure: COLONOSCOPY WITH PROPOFOL;  Surgeon: Shellia Cleverly, DO;  Location: [facility];  Service: Gastroenterology;  Laterality: N/A;   ESOPHAGOGASTRODUODENOSCOPY (EGD) WITH PROPOFOL N/A 05/29/2020   Procedure: ESOPHAGOGASTRODUODENOSCOPY (EGD) WITH PROPOFOL;  Surgeon: Shellia Cleverly, DO;  Location: [facility];  Service: Gastroenterology;   Laterality: N/A;   LEFT HEART CATH AND CORONARY ANGIOGRAPHY N/A 06/13/2019   Procedure: LEFT HEART CATH AND CORONARY ANGIOGRAPHY;  Surgeon: Lyn Records, MD;  Location: [facility];  Service: Cardiovascular;  Laterality: N/A;   POLYPECTOMY  05/29/2020   Procedure: POLYPECTOMY;  Surgeon: Shellia Cleverly, DO;  Location: [facility];  Service: Gastroenterology;;   UMBILICAL HERNIA REPAIR  2017   HPI:  Pt is 52 yo presenting via EMS to [facility] due to R sided weakness and dysarthria. 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 Pt was given TNKase and admitted. PMH: depression, HTN, Hypertensive cardiomyopathy, nonobstructive CAD. Marland Kitchen  Reports noncompliance to medications. Reports tobacco abuse. The pt reports having much difficulty with speech and language immediately following the stroke but no difficulty anymore   Assessment / Plan / Recommendation Clinical Impression  Pt seen for skilled ST services to address cognitive-linguistic assessment post stroke. The pt initially presented to the hospital with dysarthric and aphasiac speech, upon arrival the pt had neither characteristic was present in her speech. Pt reports her speech and language skills are back to baseline. Pt was given part of the [place] Aphasia Screening Test (MAST) and the [facility] Mental Status (SLUMS). The pt scored 100% in all areas of verbal expression (naming, automatic speech, repetition, and yes/no questions) on the MAST, indicating no expressive or receptive aphasia. The pt was administered the SLUMS, where she scored a 26/30, indicative of a mild cognitive impairment. The pt's relative areas of strength  immediate recall, orientation, executive function, visual spatial registration, numeric calculation. The pt's relative areas of weaknesses are attention and delayed recall. Given the results of the assessment, the pt presents with a mild cognitive impairment. The pt  expressed concerns for returning to work with her current level of "brain fog", pt educated on the process of spontaneous recovery and recommends receiving outpatient speech therapy if her concerns have not resolved by the time of discharge. The pt does not currently present with an acute need for continue speech given, no SLP f/u necessary at this time. If changes to her cognition or language arise, please reconsult.    SLP Assessment  SLP Recommendation/Assessment: All further Speech Lanaguage Pathology  needs can be addressed in the next venue of care SLP Visit Diagnosis: Cognitive communication deficit (R41.841)    Recommendations for follow up therapy are one component of a multi-disciplinary discharge planning process, led by the attending physician.  Recommendations may be updated based on patient status, additional functional criteria and insurance authorization.    Follow Up Recommendations  No SLP follow up    Assistance Recommended at Discharge     Functional Status Assessment Patient has had a recent decline in their functional status and demonstrates the ability to make significant improvements in function in a reasonable and predictable amount of time.  Frequency and Duration           SLP Evaluation Cognition  Overall Cognitive Status: No family/caregiver present to determine baseline cognitive functioning Arousal/Alertness: Awake/alert Orientation Level: Oriented X4 Year: 2024 Day of Week: Correct Attention: Sustained;Alternating Memory: Impaired Memory Impairment: Decreased short term memory Decreased Short Term Memory: Verbal basic Problem Solving: Appears intact Executive Function: Reasoning Reasoning: Appears intact Safety/Judgment: Appears intact       Comprehension  Auditory Comprehension Overall Auditory Comprehension: Appears within functional limits for tasks assessed Yes/No Questions: Within Functional Limits Commands: Within Functional  Limits Conversation: Complex EffectiveTechniques: Extra processing time;Slowed speech Visual Recognition/Discrimination Discrimination: Within Function Limits Reading Comprehension Reading Status: Not tested    Expression Expression Primary Mode of Expression: Verbal Verbal Expression Overall Verbal Expression: Appears within functional limits for tasks assessed Initiation: No impairment Automatic Speech: Day of week;Name Level of Generative/Spontaneous Verbalization: Conversation Repetition: No impairment Naming: No impairment Pragmatics: No impairment   Oral / Equities trader Overall Motor Speech: Appears within functional limits for tasks assessed            Dione Housekeeper M.S. CCC-SLP

## 2023-06-28 NOTE — H&P (View-Only) (Signed)
STROKE TEAM PROGRESS NOTE   BRIEF HPI Taylor Black is a 52 y.o. female past medical history of depression, hypertension, hypertensive cardiomyopathy, nonobstructive CAD, not on any medications presenting to the emergency department for sudden onset of right-sided weakness and speech difficulties. NIHSS 12 on arrival. Received TNK.    SIGNIFICANT HOSPITAL EVENTS 8/23 presented to ED code stroke. Received TNK 8/24- out of ICU, TEE Monday- may need loop as well  INTERIM HISTORY/SUBJECTIVE Patient was seen and evaluated this morning, reports that she is feeling much better. Actively working with PT. No complaints. Pending TEE and loop recorder scheduled for Monday     OBJECTIVE  CBC    Component Value Date/Time   WBC 6.2 06/26/2023 0307   RBC 4.87 06/26/2023 0307   HGB 13.6 06/26/2023 0314   HCT 40.0 06/26/2023 0314   PLT 312 06/26/2023 0307   MCV 82.1 06/26/2023 0307   MCH 25.3 (L) 06/26/2023 0307   MCHC 30.8 06/26/2023 0307   RDW 14.7 06/26/2023 0307   LYMPHSABS 1.8 06/26/2023 0307   MONOABS 0.6 06/26/2023 0307   EOSABS 0.1 06/26/2023 0307   BASOSABS 0.0 06/26/2023 0307    BMET    Component Value Date/Time   NA 142 06/26/2023 0314   K 4.2 06/26/2023 0314   CL 106 06/26/2023 0314   CO2 22 06/26/2023 0307   GLUCOSE 168 (H) 06/26/2023 0314   BUN 12 06/26/2023 0314   CREATININE 0.90 06/26/2023 0314   CALCIUM 8.4 (L) 06/26/2023 0307   GFRNONAA >60 06/26/2023 0307    IMAGING past 24 hours No results found.  Vitals:   06/27/23 2343 06/28/23 0354 06/28/23 0816 06/28/23 1128  BP: (!) 166/90 (!) 148/71 (!) 173/96 (!) 143/81  Pulse: 78 (!) 57 69 68  Resp: 18 18 17 15   Temp: 98.1 F (36.7 C) 97.9 F (36.6 C) 98.2 F (36.8 C) 98 F (36.7 C)  TempSrc: Oral Oral Oral Oral  SpO2: 99% 97% 100% 97%  Weight:      Height:         PHYSICAL EXAM General:  Alert, well-nourished, well-developed patient in no acute distress Psych:  Mood and affect appropriate for  situation CV: Regular rate and rhythm on monitor Respiratory:  Regular, unlabored respirations on room air GI: Abdomen soft and nontender   NEURO:  Mental Status: AA&Ox3, patient is able to give clear and coherent history Speech/Language: speech is without dysarthria or aphasia.  Naming, repetition, fluency, and comprehension intact.  Cranial Nerves:  II: PERRL. Visual fields full.  III, IV, VI: EOMI. Eyelids elevate symmetrically.  V: Sensation is intact to light touch and symmetrical to face.  VII: Face is symmetrical resting and smiling VIII: hearing intact to voice. IX, X: Palate elevates symmetrically. Phonation is normal.  NW:GNFAOZHY shrug 5/5. XII: tongue is midline without fasciculations. Motor: 5/5 strength to all muscle groups tested.  Tone: is normal and bulk is normal Sensation- Intact to light touch bilaterally. Extinction absent to light touch to DSS.   Coordination: FTN intact bilaterally, HKS: no ataxia in BLE.No drift.  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 ASPECTS 10.    CTA head & neck no LVO or significant stenosis  MRI  Multiple small acute/early subacute infarcts within the left MCA territory and MCA/PCA watershed  territory. Possible additional small acute left ACA territory infarct within the posterior left frontal lobe.  Repeat CT (s/p 24hr TNK) Stable, no hemorrhage  2D Echo EF 60-65%. Negative bubble. No intracardiac source of emboli detected.  TEE scheduled Monday Consider loop recorder if workup negative LDL 98 HgbA1c 6.1 VTE prophylaxis - SCDs, s/p TNK <24hr No antithrombotic prior to admission, now on aspirin 81 mg daily and clopidogrel 75 mg daily s/p TNK (Repeat head CT negative for hemorrhage)  Therapy  recommendations:  no PT f/u Disposition:  pending  History of dilated cardiomyopathy Last follow-up with Dr. Eden Emms in 12/2019 Hospitalized August 2020 with chest pain. Cath done 06/13/19 with no obstructive CAD Estimate EF 40-45% with global hypokinesis Started on diuretic, ARB and coreg.  Now EF 60 to 50% TEE scheduled Monday Consider loop recorder if workup negative  Hypertension Home meds:  losartan 100 once daily, carvedilol 25 twice daily Stable, not requiring clevidipine BP goal less than 180/105 post TNK Long-term BP goal normotensive  Hyperlipidemia Home meds:  atorvastatin 20 mg once daily, resumed in hospital LDL 98, goal < 70 Increased atorvastatin from 20 mg to 40 mg Continue statin at discharge  Tobacco Abuse Patient smokes 1 packs per day for unknown number of years reports currently cutting down.  Nicotine replacement therapy provided - nicotine patch 14 mg once daily as needed TOC consult for nicotine cessation  Alcohol use Reported 4-5 drinks several times a week ETOH use, alcohol level 64, advised to drink no more than 1 drink a day No signs of alcohol withdrawal on exam Due to concern for underreporting of alcohol use, start CIWA protocol and thiamine, FA and multivitamin supplemention  Other Stroke Risk Factors Obesity, Body mass index is 38.32 kg/m., BMI >/= 30 associated with increased stroke risk, recommend weight loss, diet and exercise as appropriate  Family hx stroke (father)  Other Active Problems Insomnia, restarted trazadone 50 mg once at night Uninsured, [facility] consult  Hospital day # 2   Patient seen and examined by NP/APP with MD. MD to update note as needed.   Elmer Picker, DNP, FNP-BC [facility] Pager: [phone]   ATTENDING NOTE: I reviewed above note and agree with the assessment and plan. Pt was seen and examined.  Doing better, sitting comfortable on the bed. Actively working with PT. Plan for TEE and loop  recorder hopefully tomorrow. Spent a total of 30 minutes dedicated to the care of this patient   For detailed assessment and plan, please refer to above/below as I have made changes wherever appropriate.    Windell Norfolk, MD  Stroke Neurology 06/28/2023 2:02 PM    To contact Stroke Continuity provider, please refer to [email or web address]. After hours, contact General Neurology

## 2023-06-28 NOTE — Plan of Care (Signed)
  Problem: Education: Goal: Knowledge of disease or condition will improve Outcome: Progressing   Problem: Ischemic Stroke/TIA Tissue Perfusion: Goal: Complications of ischemic stroke/TIA will be minimized Outcome: Progressing   Problem: Coping: Goal: Will verbalize positive feelings about self Outcome: Progressing   Problem: Health Behavior/Discharge Planning: Goal: Ability to manage health-related needs will improve Outcome: Progressing Goal: Goals will be collaboratively established with patient/family Outcome: Progressing   Problem: Nutrition: Goal: Risk of aspiration will decrease Outcome: Progressing Goal: Dietary intake will improve Outcome: Progressing   Problem: Clinical Measurements: Goal: Respiratory complications will improve Outcome: Progressing   Problem: Activity: Goal: Risk for activity intolerance will decrease Outcome: Progressing   Problem: Elimination: Goal: Will not experience complications related to urinary retention Outcome: Progressing   Problem: Skin Integrity: Goal: Risk for impaired skin integrity will decrease Outcome: Progressing

## 2023-06-28 NOTE — Progress Notes (Addendum)
STROKE TEAM PROGRESS NOTE   BRIEF HPI Taylor Black is a 52 y.o. female past medical history of depression, hypertension, hypertensive cardiomyopathy, nonobstructive CAD, not on any medications presenting to the emergency department for sudden onset of right-sided weakness and speech difficulties. NIHSS 12 on arrival. Received TNK.    SIGNIFICANT HOSPITAL EVENTS 8/23 presented to ED code stroke. Received TNK 8/24- out of ICU, TEE Monday- may need loop as well  INTERIM HISTORY/SUBJECTIVE Patient was seen and evaluated this morning, reports that she is feeling much better. Actively working with PT. No complaints. Pending TEE and loop recorder scheduled for Monday     OBJECTIVE  CBC    Component Value Date/Time   WBC 6.2 06/26/2023 0307   RBC 4.87 06/26/2023 0307   HGB 13.6 06/26/2023 0314   HCT 40.0 06/26/2023 0314   PLT 312 06/26/2023 0307   MCV 82.1 06/26/2023 0307   MCH 25.3 (L) 06/26/2023 0307   MCHC 30.8 06/26/2023 0307   RDW 14.7 06/26/2023 0307   LYMPHSABS 1.8 06/26/2023 0307   MONOABS 0.6 06/26/2023 0307   EOSABS 0.1 06/26/2023 0307   BASOSABS 0.0 06/26/2023 0307    BMET    Component Value Date/Time   NA 142 06/26/2023 0314   K 4.2 06/26/2023 0314   CL 106 06/26/2023 0314   CO2 22 06/26/2023 0307   GLUCOSE 168 (H) 06/26/2023 0314   BUN 12 06/26/2023 0314   CREATININE 0.90 06/26/2023 0314   CALCIUM 8.4 (L) 06/26/2023 0307   GFRNONAA >60 06/26/2023 0307    IMAGING past 24 hours No results found.  Vitals:   06/27/23 2343 06/28/23 0354 06/28/23 0816 06/28/23 1128  BP: (!) 166/90 (!) 148/71 (!) 173/96 (!) 143/81  Pulse: 78 (!) 57 69 68  Resp: 18 18 17 15   Temp: 98.1 F (36.7 C) 97.9 F (36.6 C) 98.2 F (36.8 C) 98 F (36.7 C)  TempSrc: Oral Oral Oral Oral  SpO2: 99% 97% 100% 97%  Weight:      Height:         PHYSICAL EXAM General:  Alert, well-nourished, well-developed patient in no acute distress Psych:  Mood and affect appropriate for  situation CV: Regular rate and rhythm on monitor Respiratory:  Regular, unlabored respirations on room air GI: Abdomen soft and nontender   NEURO:  Mental Status: AA&Ox3, patient is able to give clear and coherent history Speech/Language: speech is without dysarthria or aphasia.  Naming, repetition, fluency, and comprehension intact.  Cranial Nerves:  II: PERRL. Visual fields full.  III, IV, VI: EOMI. Eyelids elevate symmetrically.  V: Sensation is intact to light touch and symmetrical to face.  VII: Face is symmetrical resting and smiling VIII: hearing intact to voice. IX, X: Palate elevates symmetrically. Phonation is normal.  NW:GNFAOZHY shrug 5/5. XII: tongue is midline without fasciculations. Motor: 5/5 strength to all muscle groups tested.  Tone: is normal and bulk is normal Sensation- Intact to light touch bilaterally. Extinction absent to light touch to DSS.   Coordination: FTN intact bilaterally, HKS: no ataxia in BLE.No drift.  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 ASPECTS 10.    CTA head & neck no LVO or significant stenosis  MRI  Multiple small acute/early subacute infarcts within the left MCA territory and MCA/PCA watershed  territory. Possible additional small acute left ACA territory infarct within the posterior left frontal lobe.  Repeat CT (s/p 24hr TNK) Stable, no hemorrhage  2D Echo EF 60-65%. Negative bubble. No intracardiac source of emboli detected.  TEE scheduled Monday Consider loop recorder if workup negative LDL 98 HgbA1c 6.1 VTE prophylaxis - SCDs, s/p TNK <24hr No antithrombotic prior to admission, now on aspirin 81 mg daily and clopidogrel 75 mg daily s/p TNK (Repeat head CT negative for hemorrhage)  Therapy  recommendations:  no PT f/u Disposition:  pending  History of dilated cardiomyopathy Last follow-up with Dr. Eden Emms in 12/2019 Hospitalized August 2020 with chest pain. Cath done 06/13/19 with no obstructive CAD Estimate EF 40-45% with global hypokinesis Started on diuretic, ARB and coreg.  Now EF 60 to 50% TEE scheduled Monday Consider loop recorder if workup negative  Hypertension Home meds:  losartan 100 once daily, carvedilol 25 twice daily Stable, not requiring clevidipine BP goal less than 180/105 post TNK Long-term BP goal normotensive  Hyperlipidemia Home meds:  atorvastatin 20 mg once daily, resumed in hospital LDL 98, goal < 70 Increased atorvastatin from 20 mg to 40 mg Continue statin at discharge  Tobacco Abuse Patient smokes 1 packs per day for unknown number of years reports currently cutting down.  Nicotine replacement therapy provided - nicotine patch 14 mg once daily as needed TOC consult for nicotine cessation  Alcohol use Reported 4-5 drinks several times a week ETOH use, alcohol level 64, advised to drink no more than 1 drink a day No signs of alcohol withdrawal on exam Due to concern for underreporting of alcohol use, start CIWA protocol and thiamine, FA and multivitamin supplemention  Other Stroke Risk Factors Obesity, Body mass index is 38.32 kg/m., BMI >/= 30 associated with increased stroke risk, recommend weight loss, diet and exercise as appropriate  Family hx stroke (father)  Other Active Problems Insomnia, restarted trazadone 50 mg once at night Uninsured, [facility] consult  Hospital day # 2   Patient seen and examined by NP/APP with MD. MD to update note as needed.   Elmer Picker, DNP, FNP-BC [facility] Pager: [phone]   ATTENDING NOTE: I reviewed above note and agree with the assessment and plan. Pt was seen and examined.  Doing better, sitting comfortable on the bed. Actively working with PT. Plan for TEE and loop  recorder hopefully tomorrow. Spent a total of 30 minutes dedicated to the care of this patient   For detailed assessment and plan, please refer to above/below as I have made changes wherever appropriate.    Windell Norfolk, MD  Stroke Neurology 06/28/2023 2:02 PM    To contact Stroke Continuity provider, please refer to [email or web address]. After hours, contact General Neurology

## 2023-06-29 ENCOUNTER — Inpatient Hospital Stay (HOSPITAL_COMMUNITY): Payer: Self-pay | Admitting: Anesthesiology

## 2023-06-29 ENCOUNTER — Other Ambulatory Visit: Payer: Self-pay

## 2023-06-29 ENCOUNTER — Other Ambulatory Visit (HOSPITAL_COMMUNITY): Payer: Self-pay

## 2023-06-29 ENCOUNTER — Other Ambulatory Visit: Payer: Self-pay | Admitting: Physician Assistant

## 2023-06-29 ENCOUNTER — Inpatient Hospital Stay (HOSPITAL_COMMUNITY): Payer: Self-pay

## 2023-06-29 ENCOUNTER — Encounter (HOSPITAL_COMMUNITY)
Admission: EM | Disposition: A | Discharge status: Home / Self Care | Payer: Self-pay | Source: Home / Self Care | Attending: Neurology

## 2023-06-29 ENCOUNTER — Encounter (HOSPITAL_COMMUNITY): Payer: Self-pay | Admitting: Student in an Organized Health Care Education/Training Program

## 2023-06-29 DIAGNOSIS — F17210 Nicotine dependence, cigarettes, uncomplicated: Secondary | ICD-10-CM

## 2023-06-29 DIAGNOSIS — I10 Essential (primary) hypertension: Secondary | ICD-10-CM

## 2023-06-29 DIAGNOSIS — I25119 Atherosclerotic heart disease of native coronary artery with unspecified angina pectoris: Secondary | ICD-10-CM

## 2023-06-29 DIAGNOSIS — I351 Nonrheumatic aortic (valve) insufficiency: Secondary | ICD-10-CM

## 2023-06-29 DIAGNOSIS — I639 Cerebral infarction, unspecified: Secondary | ICD-10-CM

## 2023-06-29 HISTORY — PX: TEE WITHOUT CARDIOVERSION: SHX5443

## 2023-06-29 LAB — ECHO TEE

## 2023-06-29 SURGERY — ECHOCARDIOGRAM, TRANSESOPHAGEAL
Anesthesia: Monitor Anesthesia Care

## 2023-06-29 MED ORDER — THIAMINE HCL 100 MG PO TABS
100.0000 mg | ORAL_TABLET | Freq: Every day | ORAL | 1 refills | Status: DC
  Filled 2023-06-29: qty 30, 30d supply, fill #0
  Filled 2023-08-24: qty 30, 30d supply, fill #1

## 2023-06-29 MED ORDER — TRAZODONE HCL 50 MG PO TABS
50.0000 mg | ORAL_TABLET | Freq: Every evening | ORAL | 0 refills | Status: DC | PRN
  Filled 2023-06-29: qty 30, 30d supply, fill #0

## 2023-06-29 MED ORDER — ACETAMINOPHEN 10 MG/ML IV SOLN
1000.0000 mg | Freq: Once | INTRAVENOUS | Status: DC
  Filled 2023-06-29: qty 100

## 2023-06-29 MED ORDER — PROPOFOL 500 MG/50ML IV EMUL
INTRAVENOUS | Status: DC | PRN
  Administered 2023-06-29: 30 mg via INTRAVENOUS
  Administered 2023-06-29: 25 mg via INTRAVENOUS
  Administered 2023-06-29: 125 ug/kg/min via INTRAVENOUS

## 2023-06-29 MED ORDER — ADULT MULTIVITAMIN W/MINERALS CH
1.0000 | ORAL_TABLET | Freq: Every day | ORAL | 1 refills | Status: DC
  Filled 2023-06-29: qty 130, 130d supply, fill #0

## 2023-06-29 MED ORDER — ASPIRIN 81 MG PO TBEC
81.0000 mg | DELAYED_RELEASE_TABLET | Freq: Every day | ORAL | 12 refills | Status: DC
Start: 2023-06-30 — End: 2023-07-27
  Filled 2023-06-29: qty 120, 120d supply, fill #0

## 2023-06-29 MED ORDER — CLOPIDOGREL BISULFATE 75 MG PO TABS
75.0000 mg | ORAL_TABLET | Freq: Every day | ORAL | 0 refills | Status: DC
  Filled 2023-06-29: qty 21, 21d supply, fill #0

## 2023-06-29 MED ORDER — SODIUM CHLORIDE 0.9 % IV SOLN: INTRAVENOUS | Status: DC

## 2023-06-29 MED ORDER — ATORVASTATIN CALCIUM 40 MG PO TABS
40.0000 mg | ORAL_TABLET | Freq: Every day | ORAL | 2 refills | Status: DC
  Filled 2023-06-29: qty 30, 30d supply, fill #0

## 2023-06-29 MED ORDER — NICOTINE 14 MG/24HR TD PT24
14.0000 mg | MEDICATED_PATCH | Freq: Every day | TRANSDERMAL | 0 refills | Status: DC | PRN
Start: 2023-06-29 — End: 2023-07-27
  Filled 2023-06-29: qty 28, 28d supply, fill #0

## 2023-06-29 NOTE — Progress Notes (Signed)
Occupational Therapy Treatment Patient Details Name: Taylor Black MRN: [id number] DOB: [birth year] Today's Date: 06/29/2023   History of present illness Pt is 52 yo presenting via EMS to [facility] due to R sided weakness and dysarthria. 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  Pt was given TNKase and admitted. PMH: depression, HTN, Hypertensive cardiomyopathy, nonobstructive CAD.   OT comments  Patient up in recliner upon entry. Patient able to perform mobility and transfers without an assistive device and CGA to supervision. Patient was instructed in RUE HEP with yellow therapy band with patient demonstrating good understanding and carryover of exercises. Discharge recommendations continue to be appropriate. Acute OT to continue to follow.       If plan is discharge home, recommend the following:  A little help with walking and/or transfers;A little help with bathing/dressing/bathroom;Direct supervision/assist for medications management;Direct supervision/assist for financial management   Equipment Recommendations  None recommended by OT    Recommendations for Other Services      Precautions / Restrictions Precautions Precautions: None Restrictions Weight Bearing Restrictions: No       Mobility Bed Mobility Overal bed mobility: Independent             General bed mobility comments: OOB in recliner    Transfers Overall transfer level: Needs assistance Equipment used: None Transfers: Sit to/from Stand Sit to Stand: Contact guard assist           General transfer comment: CGA for safety, no LOB     Balance Overall balance assessment: No apparent balance deficits (not formally assessed)                                         ADL either performed or assessed with clinical judgement   ADL Overall ADL's : Needs assistance/impaired                         Toilet Transfer: Contact guard  assist Toilet Transfer Details (indicate cue type and reason): simulated           General ADL Comments: performed self care tasks earlier    [facility] Assessment              Vision       Perception     Praxis      Cognition Arousal: Alert Behavior During Therapy: WFL for tasks assessed/performed Overall Cognitive Status: No family/caregiver present to determine baseline cognitive functioning                                 General Comments: alert and oriented, able to follow directions        Exercises Exercises: General Upper Extremity General Exercises - Upper Extremity Shoulder Flexion: Strengthening, Right, 10 reps, Seated, Theraband Theraband Level (Shoulder Flexion): Level 1 (Yellow) Shoulder ABduction: Strengthening, Right, 10 reps, Seated, Theraband Theraband Level (Shoulder Abduction): Level 1 (Yellow) Elbow Flexion: Strengthening, Right, 10 reps, Seated, Theraband Theraband Level (Elbow Flexion): Level 1 (Yellow) Elbow Extension: Strengthening, Right, 10 reps, Seated, Theraband Theraband Level (Elbow Extension): Level 1 (Yellow)    Shoulder Instructions       General Comments      Pertinent Vitals/ Pain       Pain Assessment Pain Assessment: Faces Faces Pain Scale: No hurt  Pain Intervention(s): Monitored during session  Home Living                                          Prior Functioning/Environment              Frequency  Min 1X/week        Progress Toward Goals  OT Goals(current goals can now be found in the care plan section)  Progress towards OT goals: Progressing toward goals  Acute Rehab OT Goals Patient Stated Goal: get better OT Goal Formulation: With patient Time For Goal Achievement: 07/11/23 Potential to Achieve Goals: Good ADL Goals Pt Will Perform Upper Body Dressing: Independently;sitting Pt Will Perform Lower Body Dressing: Independently;sit to/from  stand;sitting/lateral leans Pt Will Transfer to Toilet: Independently;ambulating;regular height toilet Pt/caregiver will Perform Home Exercise Program: Increased ROM;Increased strength;Right Upper extremity;With written HEP provided;With Supervision Additional ADL Goal #1: pt will be able to follow 4 step trailmaking task in prep for ADLs  Plan      Co-evaluation                 AM-PAC OT "6 Clicks" Daily Activity     Outcome Measure   Help from another person eating meals?: None Help from another person taking care of personal grooming?: A Little Help from another person toileting, which includes using toliet, bedpan, or urinal?: A Little Help from another person bathing (including washing, rinsing, drying)?: A Little Help from another person to put on and taking off regular upper body clothing?: A Little Help from another person to put on and taking off regular lower body clothing?: A Little 6 Click Score: 19    End of Session    OT Visit Diagnosis: Unsteadiness on feet (R26.81);Other abnormalities of gait and mobility (R26.89);Muscle weakness (generalized) (M62.81)   Activity Tolerance Patient tolerated treatment well   Patient Left in chair;with call bell/phone within reach   Nurse Communication Mobility status        Time: 4166-0630 OT Time Calculation (min): 28 min  Charges: OT General Charges $OT Visit: 1 Visit OT Treatments $Therapeutic Activity: 8-22 mins $Therapeutic Exercise: 8-22 mins  Alfonse Flavors, OTA Acute Rehabilitation Services  Office [phone]   Dewain Penning 06/29/2023, 10:19 AM

## 2023-06-29 NOTE — Interval H&P Note (Signed)
History and Physical Interval Note:  06/29/2023 9:34 AM  Taylor Black  has presented today for surgery, with the diagnosis of stroke.  The various methods of treatment have been discussed with the patient and family. After consideration of risks, benefits and other options for treatment, the patient has consented to  Procedure(s): TRANSESOPHAGEAL ECHOCARDIOGRAM (N/A) as a surgical intervention.  The patient's history has been reviewed, patient examined, no change in status, stable for surgery.  I have reviewed the patient's chart and labs.  Questions were answered to the patient's satisfaction.     [employer]

## 2023-06-29 NOTE — Anesthesia Preprocedure Evaluation (Addendum)
Anesthesia Evaluation  Patient identified by MRN, date of birth, ID band Patient awake    Reviewed: Allergy & Precautions, NPO status , Patient's Chart, lab work & pertinent test results  Airway Mallampati: I  TM Distance: >3 FB Neck ROM: Full    Dental no notable dental hx. (+) Teeth Intact, Dental Advisory Given   Pulmonary Current Smoker and Patient abstained from smoking.   Pulmonary exam normal breath sounds clear to auscultation       Cardiovascular hypertension, Pt. on medications + angina  + CAD and + Past MI  Normal cardiovascular exam Rhythm:Regular Rate:Normal  Echo 2/21  1. Left ventricular ejection fraction, by estimation, is 55 to 60%. The left ventricle has normal function. The left ventrical has no regional wall motion abnormalities. Left ventricular diastolic parameters were normal.   2. Right ventricular systolic function is normal. The right ventricular size is normal. There is normal pulmonary artery systolic pressure.   3. Trivial mitral valve regurgitation.   4. The aortic valve is normal in structure and function. Aortic valve regurgitation is trivial . No aortic stenosis is present.   5. The inferior vena cava is normal in size with greater than 50% respiratory variability, suggesting right atrial pressure of 3 mmHg.    CATH 8/20 Normal left main. Eccentric proximal LAD the 30 to 40% range.  Some views appeared to show a lucency in individual frames.  TIMI grade III flow noted. Normal circumflex, dominant. Nondominant right coronary Mild global hypokinesis with EF 40 to 45%.  LVEDP is normal.    Neuro/Psych  PSYCHIATRIC DISORDERS Anxiety Depression    CVA    GI/Hepatic ,GERD  Medicated,,  Endo/Other    Morbid obesity  Renal/GU      Musculoskeletal negative musculoskeletal ROS (+)    Abdominal  (+) + obese  Peds  Hematology  (+) Blood dyscrasia, anemia   Anesthesia Other Findings    Reproductive/Obstetrics negative OB ROS                             Anesthesia Physical Anesthesia Plan  ASA: 3  Anesthesia Plan: MAC   Post-op Pain Management: Minimal or no pain anticipated   Induction: Intravenous  PONV Risk Score and Plan:   Airway Management Planned: Mask  Additional Equipment:   Intra-op Plan:   Post-operative Plan:   Informed Consent: I have reviewed the patients History and Physical, chart, labs and discussed the procedure including the risks, benefits and alternatives for the proposed anesthesia with the patient or authorized representative who has indicated his/her understanding and acceptance.     Dental advisory given  Plan Discussed with: CRNA  Anesthesia Plan Comments:         Anesthesia Quick Evaluation

## 2023-06-29 NOTE — Progress Notes (Signed)
error 

## 2023-06-29 NOTE — Discharge Summary (Addendum)
Stroke Discharge Summary  Patient ID: Loralie Schlereth valecia hackert   MRN: [id number]      DOB: 09/13/1971  Date of Admission: 06/26/2023 Date of Discharge: 06/29/2023  Attending Physician:  Stroke, Md, MD Consultant(s):    None  Patient's PCP:  [place], [name], MD  DISCHARGE PRIMARY DIAGNOSIS:  Acute Ischemic Infarct: multiple infarcts within the left MCA territory and MCA/PCA watershed territory s/p TNK  Cryptogenic stroke  Secondary Diagnoses: Hypertension Hyperlipidemia Tobacco abuse Obesity   Allergies as of 06/29/2023       Reactions   Morphine And Codeine Hives, Other (See Comments)   Profuse sweating        Medication List     STOP taking these medications    diphenhydramine-acetaminophen 25-500 MG Tabs tablet Commonly known as: TYLENOL PM   omeprazole 20 MG tablet Commonly known as: PRILOSEC OTC       TAKE these medications    acetaminophen 500 MG tablet Commonly known as: TYLENOL Take 2,000 mg by mouth daily as needed for moderate pain, headache or fever.   aspirin EC 81 MG tablet Take 1 tablet (81 mg total) by mouth daily. Swallow whole. Start taking on: June 30, 2023   atorvastatin 40 MG tablet Commonly known as: LIPITOR Take 1 tablet (40 mg total) by mouth daily. What changed:  medication strength how much to take additional instructions   clopidogrel 75 MG tablet Commonly known as: PLAVIX Take 1 tablet (75 mg total) by mouth daily. Start taking on: June 30, 2023   multivitamin with minerals Tabs tablet Take 1 tablet by mouth daily. Start taking on: June 30, 2023   nicotine 14 mg/24hr patch Commonly known as: NICODERM CQ - dosed in mg/24 hours Place 1 patch (14 mg total) onto the skin daily as needed (if patient wants it. remove prior to sleeping please.).   thiamine 100 MG tablet Commonly known as: Vitamin B-1 Take 1 tablet (100 mg total) by mouth daily. Start taking on: June 30, 2023   traZODone 50 MG tablet Commonly  known as: DESYREL Take 1 tablet (50 mg total) by mouth at bedtime as needed for sleep. What changed: See the new instructions.        LABORATORY STUDIES CBC    Component Value Date/Time   WBC 6.2 06/26/2023 0307   RBC 4.87 06/26/2023 0307   HGB 13.6 06/26/2023 0314   HCT 40.0 06/26/2023 0314   PLT 312 06/26/2023 0307   MCV 82.1 06/26/2023 0307   MCH 25.3 (L) 06/26/2023 0307   MCHC 30.8 06/26/2023 0307   RDW 14.7 06/26/2023 0307   LYMPHSABS 1.8 06/26/2023 0307   MONOABS 0.6 06/26/2023 0307   EOSABS 0.1 06/26/2023 0307   BASOSABS 0.0 06/26/2023 0307   CMP    Component Value Date/Time   NA 142 06/26/2023 0314   K 4.2 06/26/2023 0314   CL 106 06/26/2023 0314   CO2 22 06/26/2023 0307   GLUCOSE 168 (H) 06/26/2023 0314   BUN 12 06/26/2023 0314   CREATININE 0.90 06/26/2023 0314   CALCIUM 8.4 (L) 06/26/2023 0307   PROT 6.8 06/26/2023 0307   ALBUMIN 3.8 06/26/2023 0307   AST 31 06/26/2023 0307   ALT 20 06/26/2023 0307   ALKPHOS 67 06/26/2023 0307   BILITOT 0.3 06/26/2023 0307   GFRNONAA >60 06/26/2023 0307   GFRAA >60 05/29/2020 0214   COAGS Lab Results  Component Value Date   INR 0.9 06/26/2023   Lipid Panel  Component Value Date/Time   CHOL 190 06/26/2023 0447   TRIG 147 06/26/2023 0447   HDL 63 06/26/2023 0447   CHOLHDL 3.0 06/26/2023 0447   VLDL 29 06/26/2023 0447   LDLCALC 98 06/26/2023 0447   HgbA1C  Lab Results  Component Value Date   HGBA1C 6.1 (H) 06/26/2023   Urine Drug Screen positive for THC Alcohol Level    Component Value Date/Time   ETH 64 (H) 06/26/2023 0307     SIGNIFICANT DIAGNOSTIC STUDIES ECHO TEE  Result Date: 06/29/2023    TRANSESOPHOGEAL ECHO REPORT   Patient Name:   Jesslyn KEASHA SIRMONS Date of Exam: 06/29/2023 Medical Rec #:  [id number]               Height:       60.0 in Accession #:    [id number]              Weight:       196.2 lb Date of Birth:  02-14-1971               BSA:          1.851 m Patient Age:    52 years                 BP:           166/140 mmHg Patient Gender: F                       HR:           86 bpm. Exam Location:  Inpatient Procedure: Transesophageal Echo, Cardiac Doppler and Color Doppler Indications:    Stroke  History:        Patient has prior history of Echocardiogram examinations, most                 recent 06/26/2023. Cardiomyopathy, CAD, Stroke; Risk                 Factors:Hypertension and Dyslipidemia. ETOH, tobacco use.  Sonographer:    Milda Smart Referring Phys: [id number] ANGELA NICOLE DUKE PROCEDURE: After discussion of the risks and benefits of a TEE, an informed consent was obtained from the patient. TEE procedure time was 6 minutes. The transesophogeal probe was passed without difficulty through the esophogus of the patient. Imaged were  obtained with the patient in a left lateral decubitus position. Sedation performed by different physician. The patient was monitored while under deep sedation. Anesthestetic sedation was provided intravenously by Anesthesiology: 217.8mg  of Propofol. Image quality was good. The patient's vital signs; including heart rate, blood pressure, and oxygen saturation; remained stable throughout the procedure. The patient developed no complications during the procedure.  IMPRESSIONS  1. Left ventricular ejection fraction, by estimation, is 60 to 65%. The left ventricle has normal function. The left ventricle has no regional wall motion abnormalities.  2. Right ventricular systolic function is normal. The right ventricular size is normal.  3. No left atrial/left atrial appendage thrombus was detected.  4. The mitral valve is normal in structure. Trivial mitral valve regurgitation. No evidence of mitral stenosis.  5. The aortic valve is tricuspid. Aortic valve regurgitation is mild. No aortic stenosis is present.  6. The inferior vena cava is normal in size with greater than 50% respiratory variability, suggesting right atrial pressure of 3 mmHg.  7. Agitated saline  contrast bubble study was negative, with no evidence of any interatrial shunt. Conclusion(s)/Recommendation(s): No LA/LAA thrombus identified. Negative bubble  study for interatrial shunt. No intracardiac source of embolism detected on this on this transesophageal echocardiogram. FINDINGS  Left Ventricle: Left ventricular ejection fraction, by estimation, is 60 to 65%. The left ventricle has normal function. The left ventricle has no regional wall motion abnormalities. The left ventricular internal cavity size was normal in size. There is  no left ventricular hypertrophy. Right Ventricle: The right ventricular size is normal. No increase in right ventricular wall thickness. Right ventricular systolic function is normal. Left Atrium: Left atrial size was normal in size. No left atrial/left atrial appendage thrombus was detected. Right Atrium: Right atrial size was normal in size. Pericardium: There is no evidence of pericardial effusion. Mitral Valve: The mitral valve is normal in structure. Trivial mitral valve regurgitation. No evidence of mitral valve stenosis. Tricuspid Valve: The tricuspid valve is normal in structure. Tricuspid valve regurgitation is trivial. No evidence of tricuspid stenosis. Aortic Valve: The aortic valve is tricuspid. Aortic valve regurgitation is mild. No aortic stenosis is present. Pulmonic Valve: The pulmonic valve was normal in structure. Pulmonic valve regurgitation is not visualized. No evidence of pulmonic stenosis. Aorta: The aortic root is normal in size and structure. Venous: The inferior vena cava is normal in size with greater than 50% respiratory variability, suggesting right atrial pressure of 3 mmHg. IAS/Shunts: No atrial level shunt detected by color flow Doppler. Agitated saline contrast was given intravenously to evaluate for intracardiac shunting. Agitated saline contrast bubble study was negative, with no evidence of any interatrial shunt. Additional Comments: Spectral  Doppler performed. LEFT VENTRICLE PLAX 2D LVOT diam:     2.30 cm LVOT Area:     4.15 cm   AORTA Ao Root diam: 2.90 cm  SHUNTS Systemic Diam: 2.30 cm Donato Schultz MD Electronically signed by Donato Schultz MD Signature Date/Time: 06/29/2023/11:00:41 AM    Final    EP STUDY  Result Date: 06/29/2023 See surgical note for result.  CT HEAD WO CONTRAST ( )  Result Date: 06/27/2023 CLINICAL DATA:  Stroke follow-up EXAM: CT HEAD WITHOUT CONTRAST TECHNIQUE: Contiguous axial images were obtained from the base of the skull through the vertex without intravenous contrast. RADIATION DOSE REDUCTION: This exam was performed according to the departmental dose-optimization program which includes automated exposure control, adjustment of the mA and/or kV according to patient size and/or use of iterative reconstruction technique. COMPARISON:  06/26/2023 FINDINGS: Brain: There is no mass, hemorrhage or extra-axial collection. The size and configuration of the ventricles and extra-axial CSF spaces are normal. The brain parenchyma is normal, without acute or chronic infarction. Vascular: No abnormal hyperdensity of the major intracranial arteries or dural venous sinuses. No intracranial atherosclerosis. Skull: The visualized skull base, calvarium and extracranial soft tissues are normal. Sinuses/Orbits: No fluid levels or advanced mucosal thickening of the visualized paranasal sinuses. No mastoid or middle ear effusion. The orbits are normal. IMPRESSION: Normal head CT. Electronically Signed   By: Deatra Robinson M.D.   On: 06/27/2023 03:08   ECHOCARDIOGRAM COMPLETE BUBBLE STUDY  Result Date: 06/26/2023    ECHOCARDIOGRAM REPORT   Patient Name:   AMALEE THWEATT Date of Exam: 06/26/2023 Medical Rec #:  [id number]               Height:       60.0 in Accession #:    [id number]              Weight:       196.2 lb Date of Birth:  Oct 01, 1971  BSA:          1.851 m Patient Age:    52 years                BP:            132/91 mmHg Patient Gender: F                       HR:           72 bpm. Exam Location:  Inpatient Procedure: 2D Echo, Color Doppler, Cardiac Doppler and Saline Contrast Bubble            Study Indications:    Stroke  History:        Patient has prior history of Echocardiogram examinations, most                 recent 12/14/2019. Dilated Cardiomyopathy, CAD; Risk                 Factors:Current Smoker, Hypertension and Dyslipidemia. GERD.  Sonographer:    Raeford Razor RDCS Referring Phys: [id number] ASHISH ARORA IMPRESSIONS  1. Left ventricular ejection fraction, by estimation, is 60 to 65%. The left ventricle has normal function. The left ventricle has no regional wall motion abnormalities. Left ventricular diastolic parameters are consistent with Grade I diastolic dysfunction (impaired relaxation).  2. Right ventricular systolic function is normal. The right ventricular size is normal.  3. Left atrial size was mildly dilated.  4. The mitral valve is normal in structure. Trivial mitral valve regurgitation. No evidence of mitral stenosis.  5. The aortic valve is tricuspid. There is mild calcification of the aortic valve. Aortic valve regurgitation is trivial. No aortic stenosis is present.  6. The inferior vena cava is dilated in size with >50% respiratory variability, suggesting right atrial pressure of 8 mmHg.  7. Agitated saline contrast bubble study was negative, with no evidence of any interatrial shunt. FINDINGS  Left Ventricle: Left ventricular ejection fraction, by estimation, is 60 to 65%. The left ventricle has normal function. The left ventricle has no regional wall motion abnormalities. The left ventricular internal cavity size was normal in size. There is  no left ventricular hypertrophy. Left ventricular diastolic parameters are consistent with Grade I diastolic dysfunction (impaired relaxation). Right Ventricle: The right ventricular size is normal. No increase in right ventricular wall thickness. Right  ventricular systolic function is normal. Left Atrium: Left atrial size was mildly dilated. Right Atrium: Right atrial size was normal in size. Pericardium: There is no evidence of pericardial effusion. Mitral Valve: The mitral valve is normal in structure. Trivial mitral valve regurgitation. No evidence of mitral valve stenosis. Tricuspid Valve: The tricuspid valve is normal in structure. Tricuspid valve regurgitation is trivial. No evidence of tricuspid stenosis. Aortic Valve: The aortic valve is tricuspid. There is mild calcification of the aortic valve. Aortic valve regurgitation is trivial. No aortic stenosis is present. Aortic valve peak gradient measures 10.5 mmHg. Pulmonic Valve: The pulmonic valve was normal in structure. Pulmonic valve regurgitation is not visualized. No evidence of pulmonic stenosis. Aorta: The aortic root is normal in size and structure. Venous: The inferior vena cava is dilated in size with greater than 50% respiratory variability, suggesting right atrial pressure of 8 mmHg. IAS/Shunts: No atrial level shunt detected by color flow Doppler. Agitated saline contrast was given intravenously to evaluate for intracardiac shunting. Agitated saline contrast bubble study was negative, with no evidence of any interatrial shunt.  LEFT  VENTRICLE PLAX 2D LVIDd:         4.40 cm      Diastology LVIDs:         2.80 cm      LV e' medial:    6.74 cm/s LV PW:         0.80 cm      LV E/e' medial:  12.4 LV IVS:        0.80 cm      LV e' lateral:   8.05 cm/s LVOT diam:     2.00 cm      LV E/e' lateral: 10.4 LV SV:         67 LV SV Index:   36 LVOT Area:     3.14 cm  LV Volumes (MOD) LV vol d, MOD A2C: 164.0 ml LV vol d, MOD A4C: 115.0 ml LV vol s, MOD A2C: 57.6 ml LV vol s, MOD A4C: 42.1 ml LV SV MOD A2C:     106.4 ml LV SV MOD A4C:     115.0 ml LV SV MOD BP:      89.0 ml RIGHT VENTRICLE             IVC RV Basal diam:  2.20 cm     IVC diam: 2.10 cm RV S prime:     12.40 cm/s TAPSE (M-mode): 3.1 cm LEFT  ATRIUM             Index        RIGHT ATRIUM           Index LA diam:        3.60 cm 1.94 cm/m   RA Area:     13.80 cm LA Vol (A2C):   61.4 ml 33.16 ml/m  RA Volume:   33.10 ml  17.88 ml/m LA Vol (A4C):   51.7 ml 27.92 ml/m LA Biplane Vol: 57.4 ml 31.00 ml/m  AORTIC VALVE AV Area (Vmax): 2.02 cm AV Vmax:        162.00 cm/s AV Peak Grad:   10.5 mmHg LVOT Vmax:      104.00 cm/s LVOT Vmean:     66.600 cm/s LVOT VTI:       0.212 m  AORTA Ao Root diam: 2.80 cm Ao Asc diam:  3.30 cm MITRAL VALVE MV Area (PHT): 4.06 cm    SHUNTS MV Decel Time: 187 msec    Systemic VTI:  0.21 m MV E velocity: 83.60 cm/s  Systemic Diam: 2.00 cm MV A velocity: 99.80 cm/s MV E/A ratio:  0.84 Arvilla Meres MD Electronically signed by Arvilla Meres MD Signature Date/Time: 06/26/2023/12:49:19 PM    Final    MR BRAIN WO CONTRAST  Result Date: 06/26/2023 CLINICAL DATA:  Provided history: Stroke, follow-up. Right-sided weakness. Speech difficulty. EXAM: MRI HEAD WITHOUT CONTRAST TECHNIQUE: Multiplanar, multiecho pulse sequences of the brain and surrounding structures were obtained without intravenous contrast. COMPARISON:  Non-contrast head CT and CT angiogram head/neck 06/26/2023. FINDINGS: Brain: No age advanced or lobar predominant parenchymal atrophy. There are multiple small acute/early subacute cortical and subcortical infarcts within the left cerebral hemisphere, within the left frontal, parietal and occipital lobes (affecting the left MCA territory and left MCA/PCA watershed territory) (see annotations on series 2). An additional small acute cortical infarct is questioned within the posterior left frontal lobe (ACA territory) (series 2, image 43). Mild multifocal T2 FLAIR hyperintense signal abnormality elsewhere within the cerebral white matter, nonspecific but compatible with chronic small vessel ischemic disease.  Small chronic infarct within the right cerebellar hemisphere. No evidence of an intracranial mass. No chronic  intracranial blood products. No extra-axial fluid collection. No midline shift. Vascular: Maintained flow voids within the proximal large arterial vessels. Skull and upper cervical spine: No focal suspicious marrow lesion. Incompletely assessed cervical spondylosis. Sinuses/Orbits: No mass or acute finding within the imaged orbits. Mild mucosal thickening within a posterior right ethmoid air cell. IMPRESSION: 1. Multiple small acute/early subacute infarcts within the left MCA territory and MCA/PCA watershed territory. 2. Possible additional small acute left ACA territory infarct within the posterior left frontal lobe. 3. Background mild cerebral white matter chronic small vessel ischemic disease. 4. Small chronic infarct within the right cerebellar hemisphere. Electronically Signed   By: Jackey Loge D.O.   On: 06/26/2023 10:57   CT ANGIO HEAD NECK W WO CM (CODE STROKE)  Result Date: 06/26/2023 CLINICAL DATA:  Right-sided deficits, stroke suspected EXAM: CT ANGIOGRAPHY HEAD AND NECK WITH AND WITHOUT CONTRAST TECHNIQUE: Multidetector CT imaging of the head and neck was performed using the standard protocol during bolus administration of intravenous contrast. Multiplanar CT image reconstructions and MIPs were obtained to evaluate the vascular anatomy. Carotid stenosis measurements (when applicable) are obtained utilizing NASCET criteria, using the distal internal carotid diameter as the denominator. RADIATION DOSE REDUCTION: This exam was performed according to the departmental dose-optimization program which includes automated exposure control, adjustment of the mA and/or kV according to patient size and/or use of iterative reconstruction technique. CONTRAST:  75mL OMNIPAQUE IOHEXOL 350 MG/ML SOLN COMPARISON:  No prior CTA available, correlation is made with CT head 06/26/2023 FINDINGS: CT HEAD FINDINGS For noncontrast findings, please see same day CT head. CTA NECK FINDINGS Aortic arch: Two-vessel arch with a  common origin of the brachiocephalic and left common carotid arteries. Imaged portion shows no evidence of aneurysm or dissection. No significant stenosis of the major arch vessel origins. Right carotid system: No evidence of dissection, occlusion, or hemodynamically significant stenosis (greater than 50%). Left carotid system: No evidence of dissection, occlusion, or hemodynamically significant stenosis (greater than 50%). Vertebral arteries: No evidence of dissection, occlusion, or hemodynamically significant stenosis (greater than 50%). Skeleton: No acute osseous abnormality. Degenerative changes in the cervical spine. Other neck: No acute finding. Upper chest: No focal pulmonary opacity or pleural effusion. Review of the MIP images confirms the above findings CTA HEAD FINDINGS Anterior circulation: Both internal carotid arteries are patent to the termini, without significant stenosis. Aplastic right A1. Patent left A1. Normal anterior communicating artery. Anterior cerebral arteries are patent to their distal aspects without significant stenosis. No M1 stenosis or occlusion. MCA branches perfused to their distal aspects without significant stenosis. Posterior circulation: Vertebral arteries patent to the vertebrobasilar junction without significant stenosis. Posterior inferior cerebellar arteries patent proximally. Basilar patent to its distal aspect without significant stenosis. Superior cerebellar arteries patent proximally. Patent P1 segments. PCAs perfused to their distal aspects without significant stenosis. The bilateral posterior communicating arteries are diminutive but patent. Venous sinuses: As permitted by contrast timing, patent. Anatomic variants: None significant. Review of the MIP images confirms the above findings IMPRESSION: 1. No intracranial large vessel occlusion or significant stenosis. 2. No hemodynamically significant stenosis in the neck. Electronically Signed   By: Wiliam Ke M.D.    On: 06/26/2023 03:35   CT HEAD CODE STROKE WO CONTRAST  Result Date: 06/26/2023 CLINICAL DATA:  Code stroke.  Right-sided deficits EXAM: CT HEAD WITHOUT CONTRAST TECHNIQUE: Contiguous axial images were obtained from the base  of the skull through the vertex without intravenous contrast. RADIATION DOSE REDUCTION: This exam was performed according to the departmental dose-optimization program which includes automated exposure control, adjustment of the mA and/or kV according to patient size and/or use of iterative reconstruction technique. COMPARISON:  None Available. FINDINGS: Brain: No evidence of acute infarction, hemorrhage, mass, mass effect, or midline shift. No hydrocephalus or extra-axial collection. Vascular: No hyperdense vessel. Skull: Negative for fracture or focal lesion. Sinuses/Orbits: No acute finding. Other: The mastoid air cells are well aerated. ASPECTS [facility] Stroke Program Early CT Score) - Ganglionic level infarction (caudate, lentiform nuclei, internal capsule, insula, M1-M3 cortex): 7 - Supraganglionic infarction (M4-M6 cortex): 3 Total score (0-10 with 10 being normal): 10 IMPRESSION: No evidence of acute intracranial abnormality. ASPECTS is 10. Imaging results were communicated on 06/26/2023 at 3:13 am to provider Dr. Wilford Corner via secure text paging. Electronically Signed   By: Wiliam Ke M.D.   On: 06/26/2023 03:14       HISTORY OF PRESENT ILLNESS 52 y.o. patient with history of depression, hypertension, hypertensive cardiomyopathy and CAD was admitted with sudden onset right-sided weakness and speech difficulties.  HOSPITAL COURSE Patient was treated for her stroke with TNK.  She was found to have multiple infarcts within the left MCA territory and MCA/PCA watershed territory on MRI.  She was evaluated with a TEE, which was negative for acute abnormality and was sent home with a 30-day cardiac monitor.  Acute Ischemic Infarct: multiple infarcts within the left MCA territory and  MCA/PCA watershed territory s/p TNK Etiology:  likely cardioembolic (given multiple infarct pattern and Hx of cardiomyopathy)  Code Stroke CT head No acute abnormality. ASPECTS 10.    CTA head & neck no LVO or significant stenosis  MRI  Multiple small acute/early subacute infarcts within the left MCA territory and MCA/PCA watershed territory. Possible additional small acute left ACA territory infarct within the posterior left frontal lobe.  Repeat CT (s/p 24hr TNK) Stable, no hemorrhage  2D Echo EF 60-65%. Negative bubble. No intracardiac source of emboli detected.  TEE EF 65% with trace mitral regurgitation, no interatrial shunt 3-day cardiac monitor given to patient, she is not a candidate for loop recorder at this time as she does not have insurance LDL 98 HgbA1c 6.1 VTE prophylaxis - SCDs, s/p TNK <24hr No antithrombotic prior to admission, now on aspirin 81 mg daily and clopidogrel 75 mg daily s/p TNK (Repeat head CT negative for hemorrhage)  Therapy recommendations:  no PT f/u Disposition: Home   History of dilated cardiomyopathy Last follow-up with Dr. Eden Emms in 12/2019 Hospitalized August 2020 with chest pain. Cath done 06/13/19 with no obstructive CAD Estimate EF 40-45% with global hypokinesis Started on diuretic, ARB and coreg.  Now EF 60 to 50% TEE scheduled Monday 30-day outpatient cardiac monitor   Hypertension Home meds:  losartan 100 once daily, carvedilol 25 twice daily Stable, not requiring clevidipine BP goal less than 180/105 post TNK Long-term BP goal normotensive   Hyperlipidemia Home meds:  atorvastatin 20 mg once daily, resumed in hospital LDL 98, goal < 70 Increased atorvastatin from 20 mg to 40 mg Continue statin at discharge   Tobacco Abuse Patient smokes 1 packs per day for unknown number of years reports currently cutting down.  Nicotine replacement therapy provided - nicotine patch 14 mg once daily as needed TOC consult for nicotine cessation    Alcohol use Reported 4-5 drinks several times a week ETOH use, alcohol level 64, advised to drink  no more than 1 drink a day No signs of alcohol withdrawal on exam Due to concern for underreporting of alcohol use, start CIWA protocol and thiamine, FA and multivitamin supplemention   Other Stroke Risk Factors Obesity, Body mass index is 38.32 kg/m., BMI >/= 30 associated with increased stroke risk, recommend weight loss, diet and exercise as appropriate  Family hx stroke (father)   Other Active Problems Insomnia, restarted trazadone 50 mg once at night Uninsured, TOC consult  RN Pressure Injury Documentation:     DISCHARGE EXAM  PHYSICAL EXAM General:  Alert, well-nourished, well-developed patient in no acute distress Psych:  Mood and affect appropriate for situation CV: Regular rate and rhythm on monitor Respiratory:  Regular, unlabored respirations on room air GI: Abdomen soft and nontender  NEURO:  Mental Status: AA&Ox3  Speech/Language: speech is without dysarthria or aphasia.    Cranial Nerves:  II: PERRL. Visual fields full.  III, IV, VI: EOMI. Eyelids elevate symmetrically.  V: Sensation is intact to light touch and symmetrical to face.  VII: Smile is symmetrical.  VIII: hearing intact to voice. IX, X:Phonation is normal.  XII: tongue is midline without fasciculations. Motor: 5/5 strength to all muscle groups tested.  Tone: is normal and bulk is normal Sensation- Intact to light touch bilaterally.  Coordination: FTN intact bilaterally.No drift.  Gait- deferred   Discharge Diet       There are no active orders of the following types: Diet, Nourishments.   liquids  DISCHARGE PLAN Disposition: Home aspirin 81 mg daily and clopidogrel 75 mg daily for secondary stroke prevention for 3 weeks then aspirin 81 mg daily alone. Ongoing stroke risk factor control by Primary Care Physician at time of discharge Follow-up PCP [place], [name], MD in 2 weeks. Follow-up  in [facility] in 8 weeks, office to schedule an appointment.   35 minutes were spent preparing discharge.  Cortney E Ernestina Columbia , MSN, AGACNP-BC [facility] See Amion for schedule and pager information 06/29/2023 1:46 PM  I have personally obtained history,examined this patient, reviewed notes, independently viewed imaging studies, participated in medical decision making and plan of care.ROS completed by me personally and pertinent positives fully documented  I have made any additions or clarifications directly to the above note. Agree with note above.    Delia Heady, MD Medical Director [facility] Pager: [phone] 06/29/2023 4:26 PM

## 2023-06-29 NOTE — Transfer of Care (Signed)
Immediate Anesthesia Transfer of Care Note  Patient: Taylor Black  Procedure(s) Performed: TRANSESOPHAGEAL ECHOCARDIOGRAM  Patient Location: Cath Lab  Anesthesia Type:MAC  Level of Consciousness: drowsy and patient cooperative  Airway & Oxygen Therapy: Patient Spontanous Breathing and Patient connected to nasal cannula oxygen  Post-op Assessment: Report given to RN and Post -op Vital signs reviewed and stable  Post vital signs: Reviewed and stable  Last Vitals:  Vitals Value Taken Time  BP    Temp    Pulse    Resp    SpO2      Last Pain:  Vitals:   06/29/23 0928  TempSrc: Temporal  PainSc: 0-No pain      Patients Stated Pain Goal: 0 (06/28/23 2125)  Complications: No notable events documented.

## 2023-06-29 NOTE — CV Procedure (Signed)
   Transesophageal Echocardiogram  Indications: Stroke  Time out performed  During this procedure the patient was administered propofol under anesthesiology supervision to achieve and maintain moderate sedation.  The patient's heart rate, blood pressure, and oxygen saturation are monitored continuously during the procedure.   Findings:  Left Ventricle: Normal EF 65%  Mitral Valve: Trace MR, normal  Aortic Valve: Normal trileaflet  Tricuspid Valve: No significant TR  Left Atrium: Normal, no left atrial appendage thrombus  Right Atrium: Normal  Intraatrial septum: Normal no shunt  Bubble Contrast Study: Negative study  Impression: Normal echocardiogram  Donato Schultz, MD

## 2023-06-29 NOTE — Progress Notes (Signed)
Delivered patient's TOC meds to the front desk per patient's nurse Sonya's instructions. Also made Flow RN aware.

## 2023-06-29 NOTE — TOC Transition Note (Signed)
Transition of Care [facility]) - CM/SW Discharge Note   Patient Details  Name: Barclay Gendreau Sit MRN: [id number] Date of Birth: [birth year]  Transition of Care [facility]) CM/SW Contact:  Kermit Balo, RN Phone Number: 06/29/2023, 2:36 PM   Clinical Narrative:     Pt is from home with her daughter and grandchildren. 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  No DME.  She manages her own medications and denies any issues.  Pt drives self as needed.  Currently without health insurance. CM was able to get her an appt at [facility]. Information on the AVS. CM also added information about [facility] to assist with the cost of medications.  Pt has transportation home.  Final next level of care: OP Rehab Barriers to Discharge: Inadequate or no insurance, Barriers Unresolved (comment)   Patient Goals and CMS Choice   Choice offered to / list presented to : Patient  Discharge Placement                         Discharge Plan and Services Additional resources added to the After Visit Summary for                                       Social Determinants of Health (SDOH) Interventions SDOH Screenings   Food Insecurity: No Food Insecurity (06/26/2023)  Housing: Low Risk  (06/26/2023)  Transportation Needs: No Transportation Needs (06/26/2023)  Utilities: At Risk (06/26/2023)  Tobacco Use: High Risk (06/29/2023)     Readmission Risk Interventions     No data to display

## 2023-06-29 NOTE — Progress Notes (Signed)
Patient maintained NPO since midnight, per MD order.

## 2023-06-29 NOTE — TOC CAGE-AID Note (Signed)
Transition of Care [facility]) - CAGE-AID Screening   Patient Details  Name: Taylor Black MRN: [id number] Date of Birth: 10-26-1971  Transition of Care [facility]) CM/SW Contact:    Kermit Balo, RN Phone Number: 06/29/2023, 2:39 PM   Clinical Narrative: Pt states she is done with smoking and alcohol. She declined the need for inpatient/ outpt alcohol counseling resources.    CAGE-AID Screening:    Have You Ever Felt You Ought to Cut Down on Your Drinking or Drug Use?: Yes Have People Annoyed You By Critizing Your Drinking Or Drug Use?: No Have You Felt Bad Or Guilty About Your Drinking Or Drug Use?: No Have You Ever Had a Drink or Used Drugs First Thing In The Morning to Steady Your Nerves or to Get Rid of a Hangover?: No CAGE-AID Score: 1  Substance Abuse Education Offered: Yes (declined)

## 2023-06-29 NOTE — Progress Notes (Signed)
  Echocardiogram Echocardiogram Transesophageal has been performed.  Milda Smart 06/29/2023, 10:16 AM

## 2023-06-29 NOTE — Plan of Care (Signed)

## 2023-06-29 NOTE — Plan of Care (Signed)
  Problem: Ischemic Stroke/TIA Tissue Perfusion: Goal: Complications of ischemic stroke/TIA will be minimized Outcome: Progressing   Problem: Coping: Goal: Will verbalize positive feelings about self Outcome: Progressing   Problem: Nutrition: Goal: Risk of aspiration will decrease Outcome: Progressing   Problem: Activity: Goal: Risk for activity intolerance will decrease Outcome: Progressing   Problem: Nutrition: Goal: Adequate nutrition will be maintained Outcome: Progressing

## 2023-06-29 NOTE — Discharge Instructions (Addendum)
Taylor Black, you were admitted with sudden onset speech difficulties and right sided weakness.  You were given TNK to treat your stroke.  An MRI was performed, and you were found to have scattered strokes in the left side of your brain.  You underwent a TEE to determine if a cardiac abnormality was the cause of your strokes, and it was negative.  You will need to wear a 30 day cardiac monitor after discharge to determine if you have an arrhythmia which could have caused these strokes.  You will need to take aspirin and Plavix for 3 weeks and then aspirin alone indefinitely.  Please follow up in the stroke clinic in 6-8 weeks.  You will also need to follow up with outpatient OT.

## 2023-06-30 ENCOUNTER — Telehealth: Payer: Self-pay

## 2023-06-30 ENCOUNTER — Encounter (HOSPITAL_COMMUNITY): Payer: Self-pay | Admitting: Cardiology

## 2023-06-30 NOTE — Anesthesia Postprocedure Evaluation (Signed)
Anesthesia Post Note  Patient: Taylor Black  Procedure(s) Performed: TRANSESOPHAGEAL ECHOCARDIOGRAM     Anesthesia Type: MAC Anesthetic complications: no   No notable events documented.  Last Vitals:  Vitals:   06/29/23 1030 06/29/23 1118  BP: (!) 173/99 (!) 164/78  Pulse: 72 (!) 59  Resp: 19 18  Temp:  37.1 C  SpO2: 99% 99%    Last Pain:  Vitals:   06/29/23 1200  TempSrc:   PainSc: 4                  Lucca Greggs [employer]

## 2023-06-30 NOTE — Transitions of Care (Post Inpatient/ED Visit) (Signed)
   06/30/2023  Name: Taylor Black MRN: [id number] DOB: 09-29-1971  Today's TOC FU Call Status: Today's TOC FU Call Status:: Successful TOC FU Call Completed TOC FU Call Complete Date: 06/30/23 Patient's Name and Date of Birth confirmed.  Transition Care Management Follow-up Telephone Call Date of Discharge: 06/29/23 Discharge Facility: [facility]) Type of Discharge: Inpatient Admission Primary Inpatient Discharge Diagnosis:: Acute ischemic stroke How have you been since you were released from the hospital?: Better Any questions or concerns?: No  Items Reviewed: Did you receive and understand the discharge instructions provided?: Yes Medications obtained,verified, and reconciled?: Yes (Medications Reviewed) Any new allergies since your discharge?: No Dietary orders reviewed?: Yes Do you have support at home?: Yes  Medications Reviewed Today: Medications Reviewed Today     Reviewed by Merleen Nicely, LPN (Licensed Practical Nurse) on 06/30/23 at 1123  Med List Status: <None>   Medication Order Taking? Sig Documenting Provider Last Dose Status Informant  acetaminophen (TYLENOL) 500 MG tablet [id number] Yes Take 2,000 mg by mouth daily as needed for moderate pain, headache or fever. [provider] Taking Active Pharmacy Records, Self  aspirin EC 81 MG tablet [id number] Yes Take 1 tablet (81 mg total) by mouth daily. Swallow whole. de Saintclair Halsted, Cortney E, NP Taking Active   atorvastatin (LIPITOR) 40 MG tablet [id number] Yes Take 1 tablet (40 mg total) by mouth daily. de Saintclair Halsted, Cortney E, NP Taking Active   clopidogrel (PLAVIX) 75 MG tablet [id number] Yes Take 1 tablet (75 mg total) by mouth daily. de Saintclair Halsted, Cortney E, NP Taking Active   Multiple Vitamin (MULTIVITAMIN WITH MINERALS) TABS tablet [id number] Yes Take 1 tablet by mouth daily. de Saintclair Halsted, Cortney E, NP Taking Active   nicotine (NICODERM CQ - DOSED IN MG/24 HOURS) 14 mg/24hr patch [id number] Yes  Place 1 patch (14 mg total) onto the skin daily as needed (if patient wants it. remove prior to sleeping please.). de Saintclair Halsted, Cortney E, NP Taking Active   thiamine (VITAMIN B1) 100 MG tablet [id number] Yes Take 1 tablet (100 mg total) by mouth daily. de Saintclair Halsted, Cortney E, NP Taking Active   traZODone (DESYREL) 50 MG tablet [id number] Yes Take 1 tablet (50 mg total) by mouth at bedtime as needed for sleep. de Saintclair Halsted, Lennox Solders, NP Taking Active             Home Care and Equipment/Supplies: Were Home Health Services Ordered?: No Any new equipment or medical supplies ordered?: No  Functional Questionnaire: Do you need assistance with bathing/showering or dressing?: No Do you need assistance with meal preparation?: No Do you need assistance with eating?: No Do you have difficulty maintaining continence: No Do you need assistance with getting out of bed/getting out of a chair/moving?: No Do you have difficulty managing or taking your medications?: No  Follow up appointments reviewed: PCP Follow-up appointment confirmed?: No ([facility] -she has no insurance at this time) MD Provider Line Number:[phone] Given: Yes Specialist Hospital Follow-up appointment confirmed?: Yes Follow-Up Specialty Provider:: Dr Pearlean Brownie Do you need transportation to your follow-up appointment?: No Do you understand care options if your condition(s) worsen?: Yes-patient verbalized understanding    SIGNATURE  Woodfin Ganja LPN [facility] Nurse Health Advisor Direct Dial [phone]

## 2023-07-15 ENCOUNTER — Encounter: Payer: Self-pay | Admitting: Occupational Therapy

## 2023-07-15 ENCOUNTER — Ambulatory Visit: Payer: Self-pay | Attending: Neurology | Admitting: Occupational Therapy

## 2023-07-15 ENCOUNTER — Other Ambulatory Visit: Payer: Self-pay

## 2023-07-15 DIAGNOSIS — R278 Other lack of coordination: Secondary | ICD-10-CM | POA: Insufficient documentation

## 2023-07-15 DIAGNOSIS — R41842 Visuospatial deficit: Secondary | ICD-10-CM | POA: Insufficient documentation

## 2023-07-15 DIAGNOSIS — I639 Cerebral infarction, unspecified: Secondary | ICD-10-CM | POA: Insufficient documentation

## 2023-07-15 DIAGNOSIS — R41844 Frontal lobe and executive function deficit: Secondary | ICD-10-CM | POA: Insufficient documentation

## 2023-07-15 DIAGNOSIS — R41840 Attention and concentration deficit: Secondary | ICD-10-CM | POA: Insufficient documentation

## 2023-07-15 DIAGNOSIS — M6281 Muscle weakness (generalized): Secondary | ICD-10-CM | POA: Insufficient documentation

## 2023-07-15 NOTE — Patient Instructions (Signed)

## 2023-07-15 NOTE — Therapy (Unsigned)
OUTPATIENT OCCUPATIONAL THERAPY NEURO EVALUATION  Patient Name: Taylor Black MRN: [id number] DOB:11-Feb-1971, 52 y.o., female Today's Date: 07/15/2023  PCP: [place], [name], MD REFERRING PROVIDER: Micki Riley, MD  END OF SESSION:  OT End of Session - 07/15/23 1017     Visit Number 1    Number of Visits 7   including evaluation   Authorization Type Self Pay    OT Start Time 1023    OT Stop Time 1115    OT Time Calculation (min) 52 min    Equipment Utilized During Treatment Testing material    Activity Tolerance Patient tolerated treatment well    Behavior During Therapy [facility] for tasks assessed/performed             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  06/13/2019   Chicken pox    Depression    GERD (gastroesophageal reflux disease)    LV dysfunction  06/13/2019   Past Surgical History:  Procedure Laterality Date   BIOPSY  05/29/2020   Procedure: BIOPSY;  Surgeon: Shellia Cleverly, DO;  Location: [facility];  Service: Gastroenterology;;   bowel obstruction  1989   COLONOSCOPY WITH PROPOFOL N/A 05/29/2020   Procedure: COLONOSCOPY WITH PROPOFOL;  Surgeon: Shellia Cleverly, DO;  Location: [facility];  Service: Gastroenterology;  Laterality: N/A;   ESOPHAGOGASTRODUODENOSCOPY (EGD) WITH PROPOFOL N/A 05/29/2020   Procedure: ESOPHAGOGASTRODUODENOSCOPY (EGD) WITH PROPOFOL;  Surgeon: Shellia Cleverly, DO;  Location: [facility];  Service: Gastroenterology;  Laterality: N/A;   LEFT HEART CATH AND CORONARY ANGIOGRAPHY N/A 06/13/2019   Procedure: LEFT HEART CATH AND CORONARY ANGIOGRAPHY;  Surgeon: Lyn Records, MD;  Location: [facility] INVASIVE CV LAB;  Service: Cardiovascular;  Laterality: N/A;   POLYPECTOMY  05/29/2020   Procedure: POLYPECTOMY;  Surgeon: Shellia Cleverly, DO;  Location: [facility]OS[facility];  Service: Gastroenterology;;   TEE WITHOUT CARDIOVERSION N/A 06/29/2023   Procedure: TRANSESOPHAGEAL  ECHOCARDIOGRAM;  Surgeon: Jake Bathe, MD;  Location: [facility]VAS[facility] CV LAB;  Service: Cardiovascular;  Laterality: N/A;   UMBILICAL HERNIA REPAIR  2017   Patient Active Problem List   Diagnosis Date Noted   Acute ischemic stroke (HCC) 06/26/2023   Polyp of transverse colon    Polyp of sigmoid colon    Polyp of rectum    Grade II hemorrhoids    Gastritis and gastroduodenitis    Hiatal hernia    Iron deficiency anemia    Diverticulosis of colon without hemorrhage    GI bleeding 05/26/2020   Unstable angina (HCC) due to hypertension  06/13/2019   HTN (hypertension) 06/13/2019   Tobacco use disorder 06/13/2019   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 06/13/19  06/13/2019   LV dysfunction  06/13/2019   NSTEMI (non-ST elevated myocardial infarction) (HCC) 06/12/2019   Knee pain, right 06/05/2017   Depression, major, single episode, mild (HCC) 06/05/2017   GERD (gastroesophageal reflux disease) 06/05/2017   Obesity, morbid, BMI 40.0-49.9 (HCC) 06/05/2017    ONSET DATE: 06/29/2023 onset 06/26/23  REFERRING DIAG: I63.9 (ICD-10-CM) - Acute ischemic stroke  THERAPY DIAG:  Muscle weakness (generalized)  Other lack of coordination  Frontal lobe and executive function deficit  Attention and concentration deficit  Visuospatial deficit  Rationale for Evaluation and Treatment: Rehabilitation  SUBJECTIVE:   SUBJECTIVE STATEMENT: This was my first stroke Pt accompanied by: self  PERTINENT HISTORY: ***  PRECAUTIONS: {Therapy precautions:24002}  WEIGHT BEARING  RESTRICTIONS: No  PAIN:  Are you having pain? Dull ache in L arm at eval 4/10 (took Tylenol before coming) Increased pain since her stroke  Yes: NPRS scale: 5-7/10 Pain location: neck, arms, shoulders, knees  Pain description: stabbing sometimes, achy  Aggravating factors: Stress Relieving factors: Tylenol  FALLS: Has patient fallen in last 6 months? 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 Transfers/ambulation related to ADLs: Eating: *** Grooming: *** UB Dressing: *** LB Dressing: *** Toileting: *** Bathing: *** Tub Shower transfers: *** Equipment: {equipment:25573}  IADLs: Driving: Still driving, taking it easy, hasn't gone back to work Shopping: Hospital doctor yesterday and was wiped out by 12:30 Light housekeeping: Has resumed Meal Prep: If tired - she rests, has a Cabin crew mobility: Ind without AE Medication management: Daughter fills her medicine pill box due to mistakes when she has done it. Financial management: Patient has stuff written on chalkbaord to help her and daughter (who works at [employer]) Handwriting:  Higher education careers adviser gts tired after journalling around 1/2 page or so  MOBILITY STATUS: Independent  POSTURE COMMENTS:  forward head Sitting balance: WNL  ACTIVITY TOLERANCE: Activity tolerance: Tires more easily  FUNCTIONAL OUTCOME MEASURES: {OTFUNCTIONALMEASURES:27238}  UPPER EXTREMITY ROM:    {AROM/PROM:27142} ROM Right eval Left eval  Shoulder flexion    Shoulder abduction    Shoulder adduction    Shoulder extension    Shoulder internal rotation    Shoulder external rotation    Elbow flexion    Elbow extension    Wrist flexion    Wrist extension    Wrist ulnar deviation    Wrist radial deviation    Wrist pronation    Wrist supination    (Blank rows = not tested)  UPPER EXTREMITY MMT:     MMT Right eval Left eval  Shoulder flexion    Shoulder abduction    Shoulder adduction    Shoulder extension    Shoulder internal rotation    Shoulder external rotation    Middle trapezius    Lower  trapezius    Elbow flexion    Elbow extension    Wrist flexion    Wrist extension    Wrist ulnar deviation    Wrist radial deviation    Wrist pronation    Wrist supination    (Blank rows = not tested)  HAND FUNCTION: Grip strength: Right: 60.4, 68.3, 72.7 lbs; Left: 46.5, 54.8, 55.7 lbs  COORDINATION: {otcoordination:27237}  SENSATION: WFL  EDEMA: NA  MUSCLE TONE: {UETONE:25567}  COGNITION: Overall cognitive status: {cognition:24006}  VISION: Subjective report: *** Baseline vision: {OTBASELINEVISION:25363} Visual history: {OTVISUALHISTORY:25364}  VISION ASSESSMENT: {visionassessment:27231}  Patient has difficulty with following activities due to following visual impairments: ***  PERCEPTION: {Perception:25564}  PRAXIS: {Praxis:25565}  OBSERVATIONS: ***   TODAY'S TREATMENT:  DATE: ***   PATIENT EDUCATION: Education details: *** Person educated: {Person educated:25204} Education method: {Education Method:25205} Education comprehension: {Education Comprehension:25206}  HOME EXERCISE PROGRAM: ***   GOALS: Goals reviewed with patient? {yes/no:20286}  SHORT TERM GOALS: Target date: ***  *** Baseline: Goal status: INITIAL  2.  *** Baseline:  Goal status: INITIAL  3.  *** Baseline:  Goal status: INITIAL  4.  *** Baseline:  Goal status: INITIAL  5.  *** Baseline:  Goal status: INITIAL  6.  *** Baseline:  Goal status: INITIAL  LONG TERM GOALS: Target date: ***  *** Baseline:  Goal status: INITIAL  2.  *** Baseline:  Goal status: INITIAL  3.  *** Baseline:  Goal status: INITIAL  4.  *** Baseline:  Goal status: INITIAL  5.  *** Baseline:  Goal status: INITIAL  6.  *** Baseline:  Goal status: INITIAL  ASSESSMENT:  CLINICAL IMPRESSION: Patient is a *** y.o. *** who was seen today for  occupational therapy evaluation for ***.   PERFORMANCE DEFICITS: in functional skills including {OT physical skills:25468}, cognitive skills including {OT cognitive skills:25469}, and psychosocial skills including {OT psychosocial skills:25470}.   IMPAIRMENTS: are limiting patient from {OT performance deficits:25471}.   CO-MORBIDITIES: {Comorbidities:25485} that affects occupational performance. Patient will benefit from skilled OT to address above impairments and improve overall function.  MODIFICATION OR ASSISTANCE TO COMPLETE EVALUATION: {OT modification:25474}  OT OCCUPATIONAL PROFILE AND HISTORY: {OT PROFILE AND HISTORY:25484}  CLINICAL DECISION MAKING: {OT CDM:25475}  REHAB POTENTIAL: {rehabpotential:25112}  EVALUATION COMPLEXITY: {Evaluation complexity:25115}    PLAN:  OT FREQUENCY: {rehab frequency:25116}  OT DURATION: {rehab duration:25117}  PLANNED INTERVENTIONS: {OT Interventions:25467}  RECOMMENDED OTHER SERVICES: ***  CONSULTED AND AGREED WITH PLAN OF CARE: {ZOX:09604}  PLAN FOR NEXT SESSION: ***   Victorino Sparrow, OT 07/15/2023, 12:23 PM

## 2023-07-22 ENCOUNTER — Ambulatory Visit: Payer: Self-pay | Admitting: Occupational Therapy

## 2023-07-22 DIAGNOSIS — M6281 Muscle weakness (generalized): Secondary | ICD-10-CM

## 2023-07-22 DIAGNOSIS — R41844 Frontal lobe and executive function deficit: Secondary | ICD-10-CM

## 2023-07-22 DIAGNOSIS — R278 Other lack of coordination: Secondary | ICD-10-CM

## 2023-07-22 NOTE — Therapy (Signed)
OUTPATIENT OCCUPATIONAL THERAPY NEURO TREATMENT  Patient Name: Taylor Black MRN: [id number] DOB:28-Mar-1971, 52 y.o., female Today's Date: 07/22/2023  PCP: [place], [name], MD REFERRING PROVIDER: Micki Riley, MD  END OF SESSION:  OT End of Session - 07/22/23 1239     Visit Number 2    Number of Visits 7   including evaluation   Authorization Type Self Pay    OT Start Time 1239    OT Stop Time 1317    OT Time Calculation (min) 38 min    Activity Tolerance Patient tolerated treatment well    Behavior During Therapy [facility] for tasks assessed/performed              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  06/13/2019   Chicken pox    Depression    GERD (gastroesophageal reflux disease)    LV dysfunction  06/13/2019   Past Surgical History:  Procedure Laterality Date   BIOPSY  05/29/2020   Procedure: BIOPSY;  Surgeon: Shellia Cleverly, DO;  [facility];  Service: Gastroenterology;;   bowel obstruction  1989   COLONOSCOPY WITH PROPOFOL N/A 05/29/2020   Procedure: COLONOSCOPY WITH PROPOFOL;  Surgeon: Shellia Cleverly, DO;  Location: [facility];  Service: Gastroenterology;  Laterality: N/A;   ESOPHAGOGASTRODUODENOSCOPY (EGD) WITH PROPOFOL N/A 05/29/2020   Procedure: ESOPHAGOGASTRODUODENOSCOPY (EGD) WITH PROPOFOL;  Surgeon: Shellia Cleverly, DO;  [facility];  Service: Gastroenterology;  Laterality: N/A;   LEFT HEART CATH AND CORONARY ANGIOGRAPHY N/A 06/13/2019   Procedure: LEFT HEART CATH AND CORONARY ANGIOGRAPHY;  Surgeon: Lyn Records, MD;  [facility];  Service: Cardiovascular;  Laterality: N/A;   POLYPECTOMY  05/29/2020   Procedure: POLYPECTOMY;  Surgeon: Shellia Cleverly, DO;  [facility];  Service: Gastroenterology;;   TEE WITHOUT CARDIOVERSION N/A 06/29/2023   Procedure: TRANSESOPHAGEAL ECHOCARDIOGRAM;  Surgeon: Jake Bathe, MD;   [facility];  Service: Cardiovascular;  Laterality: N/A;   UMBILICAL HERNIA REPAIR  2017   Patient Active Problem List   Diagnosis Date Noted   Acute ischemic stroke (HCC) 06/26/2023   Polyp of transverse colon    Polyp of sigmoid colon    Polyp of rectum    Grade II hemorrhoids    Gastritis and gastroduodenitis    Hiatal hernia    Iron deficiency anemia    Diverticulosis of colon without hemorrhage    GI bleeding 05/26/2020   Unstable angina (HCC) due to hypertension  06/13/2019   HTN (hypertension) 06/13/2019   Tobacco use disorder 06/13/2019   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 06/13/19  06/13/2019   LV dysfunction  06/13/2019   NSTEMI (non-ST elevated myocardial infarction) (HCC) 06/12/2019   Knee pain, right 06/05/2017   Depression, major, single episode, mild (HCC) 06/05/2017   GERD (gastroesophageal reflux disease) 06/05/2017   Obesity, morbid, BMI 40.0-49.9 (HCC) 06/05/2017    ONSET DATE: 06/29/2023 onset 06/26/23  REFERRING DIAG: I63.9 (ICD-10-CM) - Acute ischemic stroke  THERAPY DIAG:  Muscle weakness (generalized)  Other lack of coordination  Frontal lobe and executive function deficit  Rationale for Evaluation and Treatment: Rehabilitation  SUBJECTIVE:   SUBJECTIVE STATEMENT: She has been using timers at home to help her to remember to take medications and to turn off the stove.   Pt accompanied by: self  PERTINENT HISTORY:   History of depression, hypertension,  hypertensive cardiomyopathy and CAD, admitted 06/26/23 with sudden onset of right-sided weakness and speech difficulties.  Patient was treated for her stroke with TNK.  She was found to have multiple infarcts within the left MCA territory and MCA/PCA watershed territory on MRI.     Acute Ischemic Infarct: multiple infarcts within the left MCA territory and MCA/PCA watershed territory s/p TNK Etiology:  likely cardioembolic (given multiple infarct pattern and Hx of  cardiomyopathy)   PRECAUTIONS: None  WEIGHT BEARING RESTRICTIONS: No  PAIN:  Are you having pain?  No  FALLS: Has patient fallen in last 6 months? 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  IADLs: Driving: Still driving, taking it easy though ie) no long drives and hasn't gone back to work Shopping: [employer] yesterday and felt wiped out by 12:30 Light housekeeping: Has resumed light housekeeping.  Has a dog to take care of also. Meal Prep: She has been doing this but if she is tired - she reports taking more rests than usual Community mobility: Ind without AE Medication management: Daughter fills her medicine pill box due to mistakes when she has done it. Financial management: Patient has stuff written on chalkbaord to help her and daughter (who works at [employer]) Handwriting:  Higher education careers adviser gets tired after journalling around 1/2 page or so compared to PLOF  MOBILITY STATUS: Independent - no AE  POSTURE COMMENTS:  forward head Sitting balance: WNL  ACTIVITY TOLERANCE: Activity tolerance: Tires more easily  FUNCTIONAL OUTCOME MEASURES: FOTO: 74   UPPER EXTREMITY ROM:    UE Active ROM - WFL    UPPER EXTREMITY MMT:     MMT Right eval Left eval  Shoulder flexion 4 3+  Shoulder abduction    Shoulder adduction    Shoulder extension    Shoulder internal rotation    Shoulder external rotation    Middle trapezius    Lower trapezius    Elbow flexion 4 3+  Elbow extension    Wrist flexion    Wrist extension    Wrist ulnar deviation    Wrist radial deviation     Wrist pronation    Wrist supination    (Blank rows = not tested)  HAND FUNCTION: Grip strength: Right: 60.4, 68.3, 72.7 lbs; Left: 46.5, 54.8, 55.7 lbs Average: Right: 67.1 lbs, Left: 52.3 lbs  COORDINATION: 07/22/2023: writing - 100% legible  9-hole peg: Right - 22 seconds; Left - 21 seconds  SENSATION: WFL  EDEMA: NA  MUSCLE TONE: WFL  COGNITION: Overall cognitive status: Impaired Patient completed portions of the Montreal Cognitive Assessment [facility]) and is noted to have difficulty with immediate memory and delayed recall, requiring information repeated, a category cue and even multiple choice for recall. In addition, she had difficulty with attention for subtracting 7 serially starting at 60 ie) stated 53, 46, 39, 27 (X), 20 (X)  Delayed recall 3/5  VISION: Subjective report: No changes Baseline vision: No visual deficits Visual history:  NA  VISION ASSESSMENT: Not tested  Patient has no difficulty with activities due to visual impairments.  PERCEPTION: Not tested  PRAXIS: Not tested  OBSERVATIONS: Pt  ambulates with no AE and no loss of balance. The pt appears well kept and has fingernails don (by her report, she applied the nails herself).     TODAY'S TREATMENT:                   - Self-care/home management completed for duration as noted below including:                                                                                                             Reviewed Memory Compensation Strategy Education as listed in Pt Instructions.  Pt requiring min cueing for proper recall.   OT suggested memory HEP ideas such as playing Memory game or Uno with grandchildren.  - Therapeutic exercises completed for duration as noted below including:  OT initiated bilateral green theraputty exercises (search, rolling, grip, pinch, pinch and pull, and key grip) as noted in patient instructions for coordination and strength   PATIENT EDUCATION: Education details: Putty  HEP; Memory Compensation Strategies Person educated: Patient Education method: Explanation, Verbal cues, and Handouts Education comprehension: verbalized understanding, returned demonstration, verbal cues required, and needs further education  HOME EXERCISE PROGRAM: 07/15/23: Memory Compensation Strategies 07/22/2023: green putty  GOALS: Goals reviewed with patient? Yes - will review specific goals at next appt.  LONG TERM GOALS: Target date: 08/14/23  Patient will demonstrate updated UE HEP with visual cues/images for proper execution - including theraband and putty. Baseline: New to outpt OT. Patient tires easily Goal status: INITIAL  2.  Pt will write a page in her journal with no significant decrease in size and maintain 100% legibility. Baseline: Legibility - TBD but pt report tiring after 1/2 page at this time. Goal status: INITIAL  3.  Pt will verbalize understanding of 3-5 ways to keep thinking skills sharp and ways to compensate for STM changes. Baseline: Self-reported need for help with her memory Goal status: MET - 07/22/2023 07/15/23: Provided WARM Memory Strategies handout  4.  Pt will be able to sort 6+ pill bottles successfully into pillbox without duplicating medications. Baseline: Daughter performs task for her due to mistakes. Goal status: INITIAL  5.  Pt will report improved comfort in BUEs through gentle ROM stretching and strength HEP. Baseline: Dull ache in L arm at eval 4/10 (took Tylenol before coming).  Increased pain since her stroke Goal status: INITIAL  6.  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  Baseline: Delayed Recall 3/5 Goal status: INITIAL  ASSESSMENT:  CLINICAL IMPRESSION: Patient demonstrates good understanding of HEP as needed to progress towards goals.  Coordination is WNL according to 9 hole peg though she does seem to demonstrate impaired activity tolerance.    PERFORMANCE DEFICITS: in functional skills including IADLs, coordination, dexterity, strength, pain, Fine motor control, endurance, and UE functional use, cognitive skills including attention, learn, memory, problem solving, safety awareness, sequencing, and thought, and psychosocial skills including coping strategies, environmental adaptation, and routines and behaviors.   IMPAIRMENTS: are  limiting patient from IADLs, work, and leisure.   CO-MORBIDITIES: may have co-morbidities  that affects occupational performance. Patient will benefit from skilled OT to address above impairments and improve overall function.  REHAB POTENTIAL: Excellent  PLAN:  OT FREQUENCY: 1x/week  OT DURATION: 4 weeks  PLANNED INTERVENTIONS: self care/ADL training, therapeutic exercise, therapeutic activity, neuromuscular re-education, moist heat, patient/family education, cognitive remediation/compensation, energy conservation, coping strategies training, and DME and/or AE instructions  RECOMMENDED OTHER SERVICES: Patient may benefit from ST referral - pt aware of suggestion and will determine need at next appt s/p implementing Memory Strategy Suggestions.  CONSULTED AND AGREED WITH PLAN OF CARE: Patient  PLAN FOR NEXT SESSION:  HEP - strength/coordination/endurance - review putty, initiate theraband   Strategies for medication management  Delana Meyer, OT 07/22/2023, 1:27 PM

## 2023-07-27 ENCOUNTER — Other Ambulatory Visit: Payer: Self-pay

## 2023-07-27 ENCOUNTER — Encounter: Payer: Self-pay | Admitting: Nurse Practitioner

## 2023-07-27 ENCOUNTER — Ambulatory Visit: Payer: Self-pay | Attending: Nurse Practitioner | Admitting: Nurse Practitioner

## 2023-07-27 VITALS — BP 144/89 | HR 64 | Ht 60.0 in | Wt 189.6 lb

## 2023-07-27 DIAGNOSIS — F5101 Primary insomnia: Secondary | ICD-10-CM

## 2023-07-27 DIAGNOSIS — I639 Cerebral infarction, unspecified: Secondary | ICD-10-CM

## 2023-07-27 DIAGNOSIS — E7800 Pure hypercholesterolemia, unspecified: Secondary | ICD-10-CM

## 2023-07-27 DIAGNOSIS — F17200 Nicotine dependence, unspecified, uncomplicated: Secondary | ICD-10-CM

## 2023-07-27 DIAGNOSIS — I10 Essential (primary) hypertension: Secondary | ICD-10-CM

## 2023-07-27 MED ORDER — LOSARTAN POTASSIUM 50 MG PO TABS
50.0000 mg | ORAL_TABLET | Freq: Every day | ORAL | 1 refills | Status: DC
Start: 2023-07-27 — End: 2023-09-17
  Filled 2023-07-27: qty 90, 90d supply, fill #0

## 2023-07-27 MED ORDER — ATORVASTATIN CALCIUM 40 MG PO TABS
40.0000 mg | ORAL_TABLET | Freq: Every day | ORAL | 1 refills | Status: DC
  Filled 2023-07-27: qty 90, 90d supply, fill #0
  Filled 2023-10-23: qty 90, 90d supply, fill #1

## 2023-07-27 MED ORDER — TRAZODONE HCL 50 MG PO TABS
50.0000 mg | ORAL_TABLET | Freq: Every evening | ORAL | 1 refills | Status: DC | PRN
  Filled 2023-07-27: qty 90, 90d supply, fill #0
  Filled 2023-10-25: qty 90, 90d supply, fill #1

## 2023-07-27 MED ORDER — CLOPIDOGREL BISULFATE 75 MG PO TABS
75.0000 mg | ORAL_TABLET | Freq: Every day | ORAL | 1 refills | Status: DC
  Filled 2023-07-27: qty 90, 90d supply, fill #0

## 2023-07-27 MED ORDER — CARVEDILOL 3.125 MG PO TABS
3.1250 mg | ORAL_TABLET | Freq: Two times a day (BID) | ORAL | 3 refills | Status: AC
Start: 2023-07-27 — End: ?
  Filled 2023-07-27: qty 60, 30d supply, fill #0
  Filled 2023-08-24: qty 60, 30d supply, fill #1
  Filled 2023-09-18: qty 60, 30d supply, fill #2
  Filled 2023-10-23: qty 60, 30d supply, fill #3

## 2023-07-27 MED ORDER — NICOTINE 14 MG/24HR TD PT24
14.0000 mg | MEDICATED_PATCH | Freq: Every day | TRANSDERMAL | 0 refills | Status: DC | PRN
  Filled 2023-07-27: qty 28, 28d supply, fill #0

## 2023-07-27 MED ORDER — ADULT MULTIVITAMIN W/MINERALS CH
1.0000 | ORAL_TABLET | Freq: Every day | ORAL | 1 refills | Status: DC
  Filled 2023-07-27: qty 130, 130d supply, fill #0
  Filled 2024-01-06: qty 90, 90d supply, fill #0
  Filled 2024-04-20: qty 90, 90d supply, fill #1
  Filled 2024-05-04 – 2024-06-08 (×4): qty 90, 90d supply, fill #0

## 2023-07-27 MED ORDER — ASPIRIN 81 MG PO TBEC
81.0000 mg | DELAYED_RELEASE_TABLET | Freq: Every day | ORAL | 12 refills | Status: AC
  Filled 2023-07-27 – 2024-05-09 (×4): qty 120, 120d supply, fill #0

## 2023-07-27 NOTE — Progress Notes (Unsigned)
Assessment & Plan:  Taylor Black was seen today for new patient (initial visit).  Diagnoses and all orders for this visit:  Primary hypertension Send BP readings via mychart -     losartan (COZAAR) 50 MG tablet; Take 1 tablet (50 mg total) by mouth daily. -     carvedilol (COREG) 3.125 MG tablet; Take 1 tablet (3.125 mg total) by mouth 2 (two) times daily with a meal.  Hypercholesterolemia -     atorvastatin (LIPITOR) 40 MG tablet; Take 1 tablet (40 mg total) by mouth daily. Lab Results  Component Value Date   LDLCALC 98 06/26/2023     Primary insomnia -     traZODone (DESYREL) 50 MG tablet; Take 1 tablet (50 mg total) by mouth at bedtime as needed for sleep.  Tobacco dependence -     nicotine (NICODERM CQ - DOSED IN MG/24 HOURS) 14 mg/24hr patch; Place 1 patch (14 mg total) onto the skin daily as needed (if patient wants it. remove prior to sleeping please.).  Acute ischemic stroke (HCC) Follow up with Neurology  No residual deficits She is requesting FMLA papers be filled out. I have deferred this to Neuro as it is due to her stroke -     aspirin EC 81 MG tablet; Take 1 tablet (81 mg total) by mouth daily. Swallow whole. -     clopidogrel (PLAVIX) 75 MG tablet; Take 1 tablet (75 mg total) by mouth daily.  Other orders -     Multiple Vitamin (MULTIVITAMIN WITH MINERALS) TABS tablet; Take 1 tablet by mouth daily.    Patient has been counseled on age-appropriate routine health concerns for screening and prevention. These are reviewed and up-to-date. Referrals have been placed accordingly. Immunizations are up-to-date or declined.    Subjective:   Chief Complaint  Patient presents with   New Patient (Initial Visit)   HPI Taylor Black 52 y.o. female presents to office today to establish care.   She has a past medical history of Anxiety, 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 06/13/19  (06/13/2019), CM,  Depression, GERD,  and LV dysfunction  (06/13/2019).    Taylor Black was admitted to the hospital on 06-26-2023 with sudden onset of right sided weakness. Diagnosed with Left hemispheric infarct. She was administered TNK. MRI revealed multiple infarcts.  TEE negative and she was sent home with 30 day monitor which is present today on exam. She was discharged home 3 days later on ASA and plavix. Did not require any home therapies. BP meds were resumed: losartan and carvedilol and she will need to follow up with Dr. Eden Emms whom she hasn't seen in several years.     Blood pressures is elevated today and she reports similar readings at home. Will resume losartan and carvedilol at low doses. She is encouraged to abstain from smoking.  BP Readings from Last 3 Encounters:  07/27/23 (!) 144/89  06/29/23 (!) 164/78  12/04/20 128/84      Review of Systems  Constitutional:  Negative for fever, malaise/fatigue and weight loss.  HENT: Negative.  Negative for nosebleeds.   Eyes: Negative.  Negative for blurred vision, double vision and photophobia.  Respiratory: Negative.  Negative for cough and shortness of breath.   Cardiovascular: Negative.  Negative for chest pain, palpitations and leg swelling.  Gastrointestinal: Negative.  Negative for heartburn, nausea and vomiting.  Musculoskeletal: Negative.  Negative for myalgias.  Neurological: Negative.  Negative for dizziness, focal weakness, seizures and headaches.  Psychiatric/Behavioral:  Negative.  Negative for suicidal ideas.     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  06/13/2019   Chicken pox    Depression    GERD (gastroesophageal reflux disease)    LV dysfunction  06/13/2019    Past Surgical History:  Procedure Laterality Date   BIOPSY  05/29/2020   Procedure: BIOPSY;  Surgeon: Shellia Cleverly, DO;  [facility];  Service: Gastroenterology;;   bowel obstruction  1989   COLONOSCOPY WITH PROPOFOL N/A 05/29/2020    Procedure: COLONOSCOPY WITH PROPOFOL;  Surgeon: Shellia Cleverly, DO;  [facility];  Service: Gastroenterology;  Laterality: N/A;   ESOPHAGOGASTRODUODENOSCOPY (EGD) WITH PROPOFOL N/A 05/29/2020   Procedure: ESOPHAGOGASTRODUODENOSCOPY (EGD) WITH PROPOFOL;  Surgeon: Shellia Cleverly, DO;  [facility];  Service: Gastroenterology;  Laterality: N/A;   LEFT HEART CATH AND CORONARY ANGIOGRAPHY N/A 06/13/2019   Procedure: LEFT HEART CATH AND CORONARY ANGIOGRAPHY;  Surgeon: Lyn Records, MD;  [facility];  Service: Cardiovascular;  Laterality: N/A;   POLYPECTOMY  05/29/2020   Procedure: POLYPECTOMY;  Surgeon: Shellia Cleverly, DO;  [facility];  Service: Gastroenterology;;   TEE WITHOUT CARDIOVERSION N/A 06/29/2023   Procedure: TRANSESOPHAGEAL ECHOCARDIOGRAM;  Surgeon: Jake Bathe, MD;  [facility];  Service: Cardiovascular;  Laterality: N/A;   UMBILICAL HERNIA REPAIR  2017    Family History  Problem Relation Age of Onset   Heart disease Father    Stroke Father    Hypertension Father    Kidney disease Father    Diabetes Father    Alcohol abuse Father    Depression Paternal Grandmother    Kidney disease Paternal Grandmother    Cancer Maternal Grandmother        Cervical    Social History Reviewed with no changes to be made today.   Outpatient Medications Prior to Visit  Medication Sig Dispense Refill   acetaminophen (TYLENOL) 500 MG tablet Take 2,000 mg by mouth daily as needed for moderate pain, headache or fever.     thiamine (VITAMIN B1) 100 MG tablet Take 1 tablet (100 mg total) by mouth daily. 30 tablet 1   aspirin EC 81 MG tablet Take 1 tablet (81 mg total) by mouth daily. Swallow whole. 120 tablet 12   atorvastatin (LIPITOR) 40 MG tablet Take 1 tablet (40 mg total) by mouth daily. 30 tablet 2   clopidogrel (PLAVIX) 75 MG tablet Take 1 tablet (75 mg total) by mouth daily. 21 tablet 0   Multiple Vitamin  (MULTIVITAMIN WITH MINERALS) TABS tablet Take 1 tablet by mouth daily. 130 tablet 1   nicotine (NICODERM CQ - DOSED IN MG/24 HOURS) 14 mg/24hr patch Place 1 patch (14 mg total) onto the skin daily as needed (if patient wants it. remove prior to sleeping please.). 28 patch 0   traZODone (DESYREL) 50 MG tablet Take 1 tablet (50 mg total) by mouth at bedtime as needed for sleep. 30 tablet 0   No facility-administered medications prior to visit.    Allergies  Allergen Reactions   Morphine And Codeine Hives and Other (See Comments)    Profuse sweating       Objective:    BP (!) 144/89   Pulse 64   Ht 5' (1.524 m)   Wt 189 lb 9.6 oz (86 kg)   LMP 06/11/2020   SpO2 97%   BMI  37.03 kg/m  Wt Readings from Last 3 Encounters:  07/27/23 189 lb 9.6 oz (86 kg)  06/29/23 194 lb (88 kg)  07/26/20 194 lb (88 kg)    Physical Exam Vitals and nursing note reviewed.  Constitutional:      Appearance: She is well-developed.  HENT:     Head: Normocephalic and atraumatic.  Cardiovascular:     Rate and Rhythm: Normal rate and regular rhythm.     Heart sounds: Normal heart sounds. No murmur heard.    No friction rub. No gallop.  Pulmonary:     Effort: Pulmonary effort is normal. No tachypnea or respiratory distress.     Breath sounds: Normal breath sounds. No decreased breath sounds, wheezing, rhonchi or rales.  Chest:     Chest wall: No tenderness.  Abdominal:     General: Bowel sounds are normal.     Palpations: Abdomen is soft.  Musculoskeletal:        General: Normal range of motion.     Cervical back: Normal range of motion.  Skin:    General: Skin is warm and dry.  Neurological:     Mental Status: She is alert and oriented to person, place, and time.     Coordination: Coordination normal.  Psychiatric:        Behavior: Behavior normal. Behavior is cooperative.        Thought Content: Thought content normal.        Judgment: Judgment normal.          Patient has been  counseled extensively about nutrition and exercise as well as the importance of adherence with medications and regular follow-up. The patient was given clear instructions to go to ER or return to medical center if symptoms don't improve, worsen or new problems develop. The patient verbalized understanding.   Follow-up: Return in about 3 months (around 10/26/2023).   Claiborne Rigg, FNP-BC [facility] [place], [place] [phone]   07/30/2023, 2:57 PM

## 2023-07-29 ENCOUNTER — Ambulatory Visit: Payer: Self-pay | Admitting: Occupational Therapy

## 2023-07-29 DIAGNOSIS — M6281 Muscle weakness (generalized): Secondary | ICD-10-CM

## 2023-07-29 DIAGNOSIS — R41844 Frontal lobe and executive function deficit: Secondary | ICD-10-CM

## 2023-07-29 DIAGNOSIS — R41842 Visuospatial deficit: Secondary | ICD-10-CM

## 2023-07-29 DIAGNOSIS — R278 Other lack of coordination: Secondary | ICD-10-CM

## 2023-07-29 DIAGNOSIS — R41840 Attention and concentration deficit: Secondary | ICD-10-CM

## 2023-07-29 NOTE — Therapy (Signed)
OUTPATIENT OCCUPATIONAL THERAPY NEURO TREATMENT  Patient Name: Taylor Black MRN: [id number] DOB:02-May-1971, 52 y.o., female Today's Date: 07/29/2023  PCP: [place], [name], MD REFERRING PROVIDER: Micki Riley, MD  END OF SESSION:  OT End of Session - 07/29/23 1233     Visit Number 3    Number of Visits 7   including evaluation   Authorization Type Self Pay    OT Start Time 1233    OT Stop Time 1312    OT Time Calculation (min) 39 min    Activity Tolerance Patient tolerated treatment well    Behavior During Therapy [facility] for tasks assessed/performed              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  06/13/2019   Chicken pox    Depression    GERD (gastroesophageal reflux disease)    LV dysfunction  06/13/2019   Past Surgical History:  Procedure Laterality Date   BIOPSY  05/29/2020   Procedure: BIOPSY;  Surgeon: Shellia Cleverly, DO;  [facility];  Service: Gastroenterology;;   bowel obstruction  1989   COLONOSCOPY WITH PROPOFOL N/A 05/29/2020   Procedure: COLONOSCOPY WITH PROPOFOL;  Surgeon: Shellia Cleverly, DO;  [facility];  Service: Gastroenterology;  Laterality: N/A;   ESOPHAGOGASTRODUODENOSCOPY (EGD) WITH PROPOFOL N/A 05/29/2020   Procedure: ESOPHAGOGASTRODUODENOSCOPY (EGD) WITH PROPOFOL;  Surgeon: Shellia Cleverly, DO;  [facility];  Service: Gastroenterology;  Laterality: N/A;   LEFT HEART CATH AND CORONARY ANGIOGRAPHY N/A 06/13/2019   Procedure: LEFT HEART CATH AND CORONARY ANGIOGRAPHY;  Surgeon: Lyn Records, MD;  [facility];  Service: Cardiovascular;  Laterality: N/A;   POLYPECTOMY  05/29/2020   Procedure: POLYPECTOMY;  Surgeon: Shellia Cleverly, DO;  [facility];  Service: Gastroenterology;;   TEE WITHOUT CARDIOVERSION N/A 06/29/2023   Procedure: TRANSESOPHAGEAL ECHOCARDIOGRAM;  Surgeon: Jake Bathe, MD;   [facility];  Service: Cardiovascular;  Laterality: N/A;   UMBILICAL HERNIA REPAIR  2017   Patient Active Problem List   Diagnosis Date Noted   Acute ischemic stroke (HCC) 06/26/2023   Polyp of transverse colon    Polyp of sigmoid colon    Polyp of rectum    Grade II hemorrhoids    Gastritis and gastroduodenitis    Hiatal hernia    Iron deficiency anemia    Diverticulosis of colon without hemorrhage    GI bleeding 05/26/2020   Unstable angina (HCC) due to hypertension  06/13/2019   HTN (hypertension) 06/13/2019   Tobacco use disorder 06/13/2019   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 06/13/19  06/13/2019   LV dysfunction  06/13/2019   NSTEMI (non-ST elevated myocardial infarction) (HCC) 06/12/2019   Knee pain, right 06/05/2017   Depression, major, single episode, mild (HCC) 06/05/2017   GERD (gastroesophageal reflux disease) 06/05/2017   Obesity, morbid, BMI 40.0-49.9 (HCC) 06/05/2017    ONSET DATE: 06/29/2023 onset 06/26/23  REFERRING DIAG: I63.9 (ICD-10-CM) - Acute ischemic stroke  THERAPY DIAG:  Muscle weakness (generalized)  Other lack of coordination  Frontal lobe and executive function deficit  Attention and concentration deficit  Visuospatial deficit  Rationale for Evaluation and Treatment: Rehabilitation  SUBJECTIVE:   SUBJECTIVE STATEMENT: She has been using timers at home to help her to remember to take medications and to turn off the stove.   Pt accompanied by: self  PERTINENT HISTORY:   History of depression, hypertension, hypertensive cardiomyopathy and CAD, admitted 06/26/23 with sudden onset of right-sided weakness and speech difficulties.  Patient was treated for her stroke with TNK.  She was found to have multiple infarcts within the left MCA territory and MCA/PCA watershed territory on MRI.     Acute Ischemic Infarct: multiple infarcts within the left MCA territory and MCA/PCA watershed territory s/p TNK Etiology:  likely  cardioembolic (given multiple infarct pattern and Hx of cardiomyopathy)   PRECAUTIONS: None  WEIGHT BEARING RESTRICTIONS: No  PAIN:  Are you having pain?  No  FALLS: Has patient fallen in last 6 months? 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  IADLs: Driving: Still driving, taking it easy though ie) no long drives and hasn't gone back to work Shopping: [employer] yesterday and felt wiped out by 12:30 Light housekeeping: Has resumed light housekeeping.  Has a dog to take care of also. Meal Prep: She has been doing this but if she is tired - she reports taking more rests than usual Community mobility: Ind without AE Medication management: Daughter fills her medicine pill box due to mistakes when she has done it. Financial management: Patient has stuff written on chalkbaord to help her and daughter (who works at [employer]) Handwriting:  Higher education careers adviser gets tired after journalling around 1/2 page or so compared to PLOF  MOBILITY STATUS: Independent - no AE  POSTURE COMMENTS:  forward head Sitting balance: WNL  ACTIVITY TOLERANCE: Activity tolerance: Tires more easily  FUNCTIONAL OUTCOME MEASURES: FOTO: 74   UPPER EXTREMITY ROM:    UE Active ROM - WFL    UPPER EXTREMITY MMT:     MMT Right eval Left eval  Shoulder flexion 4 3+  Shoulder abduction    Shoulder adduction    Shoulder extension    Shoulder internal rotation    Shoulder external rotation    Middle trapezius    Lower trapezius    Elbow flexion 4 3+  Elbow extension    Wrist flexion    Wrist extension     Wrist ulnar deviation    Wrist radial deviation    Wrist pronation    Wrist supination    (Blank rows = not tested)  HAND FUNCTION: Grip strength: Right: 60.4, 68.3, 72.7 lbs; Left: 46.5, 54.8, 55.7 lbs Average: Right: 67.1 lbs, Left: 52.3 lbs  COORDINATION: 07/22/2023: writing - 100% legible  9-hole peg: Right - 22 seconds; Left - 21 seconds  SENSATION: WFL  EDEMA: NA  MUSCLE TONE: WFL  COGNITION: Overall cognitive status: Impaired Patient completed portions of the Montreal Cognitive Assessment [facility]) and is noted to have difficulty with immediate memory and delayed recall, requiring information repeated, a category cue and even multiple choice for recall. In addition, she had difficulty with attention for subtracting 7 serially starting at 60 ie) stated 53, 46, 39, 27 (X), 20 (X)  Delayed recall 3/5  VISION: Subjective report: No changes Baseline vision: No visual deficits Visual history:  NA  VISION ASSESSMENT: Not tested  Patient has no difficulty with activities due to visual impairments.  PERCEPTION: Not  tested  PRAXIS: Not tested  OBSERVATIONS: Pt ambulates with no AE and no loss of balance. The pt appears well kept and has fingernails don (by her report, she applied the nails herself).     TODAY'S TREATMENT:                   - Therapeutic exercises completed for duration as noted below including:  OT reviewed bilateral green theraputty exercises (search, rolling, grip, pinch, pinch and pull, and key grip) as noted in patient instructions for coordination and strength. Pt required min cueing for proper completion.  OT initiated green Thera-Band HEP including shoulder flexion, horizontal abd/add, bicep curl, and tricep extension to promote strengthening of affected extremity and overall endurance as noted in pt instructions.    For hand strength, pt completed opening of various jars, which pt completed with only mild difficulty.   PATIENT  EDUCATION: Education details: Putty and theraband HEPs Person educated: Patient Education method: Explanation, Verbal cues, and Handouts Education comprehension: verbalized understanding, returned demonstration, verbal cues required, and needs further education  HOME EXERCISE PROGRAM: 07/15/23: Memory Compensation Strategies 07/22/2023: green putty 07/29/2023: green theraband  GOALS: Goals reviewed with patient? Yes - will review specific goals at next appt.  LONG TERM GOALS: Target date: 08/14/23  Patient will demonstrate updated UE HEP with visual cues/images for proper execution - including theraband and putty. Baseline: New to outpt OT. Patient tires easily Goal status: INITIAL  2.  Pt will write a page in her journal with no significant decrease in size and maintain 100% legibility. Baseline: Legibility - TBD but pt report tiring after 1/2 page at this time. Goal status: INITIAL  3.  Pt will verbalize understanding of 3-5 ways to keep thinking skills sharp and ways to compensate for STM changes. Baseline: Self-reported need for help with her memory Goal status: MET - 07/22/2023 07/15/23: Provided WARM Memory Strategies handout  4.  Pt will be able to sort 6+ pill bottles successfully into pillbox without duplicating medications. Baseline: Daughter performs task for her due to mistakes. Goal status: INITIAL  5.  Pt will report improved comfort in BUEs through gentle ROM stretching and strength HEP. Baseline: Dull ache in L arm at eval 4/10 (took Tylenol before coming).  Increased pain since her stroke Goal status: INITIAL  6.  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  Baseline: Delayed Recall 3/5 Goal status: INITIAL  ASSESSMENT:  CLINICAL IMPRESSION: Patient demonstrates good understanding of HEP as needed to progress towards goals.  Added upper body strengthening to help with weakness and overall  endurance. She would benefit from functional activity completion to simulate work-related tasks. Demonstrates   PERFORMANCE DEFICITS: in functional skills including IADLs, coordination, dexterity, strength, pain, Fine motor control, endurance, and UE functional use, cognitive skills including attention, learn, memory, problem solving, safety awareness, sequencing, and thought, and psychosocial skills including coping strategies, environmental adaptation, and routines and behaviors.   IMPAIRMENTS: are limiting patient from IADLs, work, and leisure.   CO-MORBIDITIES: may have co-morbidities  that affects occupational performance. Patient will benefit from skilled OT to address above impairments and improve overall function.  REHAB POTENTIAL: Excellent  PLAN:  OT FREQUENCY: 1x/week  OT DURATION: 4 weeks  PLANNED INTERVENTIONS: self care/ADL training, therapeutic exercise, therapeutic activity, neuromuscular re-education, moist heat, patient/family education, cognitive remediation/compensation, energy conservation, coping strategies training, and DME and/or AE instructions  RECOMMENDED OTHER SERVICES: Patient may  benefit from ST referral - pt aware of suggestion and will determine need at next appt s/p implementing Memory Strategy Suggestions.  CONSULTED AND AGREED WITH PLAN OF CARE: Patient  PLAN FOR NEXT SESSION:  HEP - strength/coordination/endurance - review theraband   Strategies for medication management  Consider ST referral -- depending on finances  Delana Meyer, OT 07/29/2023, 1:40 PM

## 2023-07-29 NOTE — Patient Instructions (Addendum)
Upper Body Strengthening Exercises Comments Sit upright, away from the back of your chair. Keep abdominal muscles engaged i.e. pull belly button to spine.  FOR ALL EXERCISES: Repeat 10 Times  Hold 3 Seconds  Complete 1 Set  Perform 2 Times a Day  ELASTIC BAND FLEXION   Place unaffected arm on your leg or hip. With your affected arm, hold elastic band in front of you and pull the band upward towards the ceiling as shown.    ELASTIC BAND HORIZONTAL ABDUCTION - SCAPULAR RETRACTION  Start by holding elastic band in front of your chest with your elbows straight. Then, pull your arms apart and towards the side while squeezing your shoulder blades together. Return to starting position and repeat.             ELASTIC BAND TRICEPS EXTENSION  While seated, hold and fixate one end of an elastic band against your chest. Hold the other end with your opposite hand with your elbow bent and arm by your side.   Start by pulling the band downward so that the elbow goes from a bent position to a straightened position as shown. Return to starting position and repeat.   BICEPS CURL WITH BAND  While sitting in an upright position, put an elastic band underneath your feet (as pictured)  OR underneath your thighs. Grip each end of the band, keep the elbows tucked to the body, and bring hands to shoulders by bending at the elbows.

## 2023-07-30 ENCOUNTER — Encounter: Payer: Self-pay | Admitting: Nurse Practitioner

## 2023-08-05 ENCOUNTER — Ambulatory Visit: Payer: Self-pay | Attending: Neurology | Admitting: Occupational Therapy

## 2023-08-05 DIAGNOSIS — R41840 Attention and concentration deficit: Secondary | ICD-10-CM | POA: Insufficient documentation

## 2023-08-05 DIAGNOSIS — R41844 Frontal lobe and executive function deficit: Secondary | ICD-10-CM | POA: Insufficient documentation

## 2023-08-05 DIAGNOSIS — M6281 Muscle weakness (generalized): Secondary | ICD-10-CM | POA: Insufficient documentation

## 2023-08-05 DIAGNOSIS — R278 Other lack of coordination: Secondary | ICD-10-CM | POA: Insufficient documentation

## 2023-08-05 NOTE — Therapy (Unsigned)
OUTPATIENT OCCUPATIONAL THERAPY NEURO TREATMENT  Patient Name: Caro Brundidge MRN: [id number] DOB:22-Nov-1970, 52 y.o., female Today's Date: 08/05/2023  PCP: [place], [name], MD REFERRING PROVIDER: Micki Riley, MD  END OF SESSION:  OT End of Session - 08/05/23 1236     Visit Number 4    Number of Visits 7   including evaluation   Authorization Type Self Pay    OT Start Time 1235    OT Stop Time 1315    OT Time Calculation (min) 40 min    Activity Tolerance Patient tolerated treatment well    Behavior During Therapy [facility] for tasks assessed/performed              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  06/13/2019   Chicken pox    Depression    GERD (gastroesophageal reflux disease)    LV dysfunction  06/13/2019   Past Surgical History:  Procedure Laterality Date   BIOPSY  05/29/2020   Procedure: BIOPSY;  Surgeon: Shellia Cleverly, DO;  [facility];  Service: Gastroenterology;;   bowel obstruction  1989   COLONOSCOPY WITH PROPOFOL N/A 05/29/2020   Procedure: COLONOSCOPY WITH PROPOFOL;  Surgeon: Shellia Cleverly, DO;  [facility];  Service: Gastroenterology;  Laterality: N/A;   ESOPHAGOGASTRODUODENOSCOPY (EGD) WITH PROPOFOL N/A 05/29/2020   Procedure: ESOPHAGOGASTRODUODENOSCOPY (EGD) WITH PROPOFOL;  Surgeon: Shellia Cleverly, DO;  [facility];  Service: Gastroenterology;  Laterality: N/A;   LEFT HEART CATH AND CORONARY ANGIOGRAPHY N/A 06/13/2019   Procedure: LEFT HEART CATH AND CORONARY ANGIOGRAPHY;  Surgeon: Lyn Records, MD;  [facility];  Service: Cardiovascular;  Laterality: N/A;   POLYPECTOMY  05/29/2020   Procedure: POLYPECTOMY;  Surgeon: Shellia Cleverly, DO;  [facility];  Service: Gastroenterology;;   TEE WITHOUT CARDIOVERSION N/A 06/29/2023   Procedure: TRANSESOPHAGEAL ECHOCARDIOGRAM;  Surgeon: Jake Bathe, MD;   [facility];  Service: Cardiovascular;  Laterality: N/A;   UMBILICAL HERNIA REPAIR  2017   Patient Active Problem List   Diagnosis Date Noted   Acute ischemic stroke (HCC) 06/26/2023   Polyp of transverse colon    Polyp of sigmoid colon    Polyp of rectum    Grade II hemorrhoids    Gastritis and gastroduodenitis    Hiatal hernia    Iron deficiency anemia    Diverticulosis of colon without hemorrhage    GI bleeding 05/26/2020   Unstable angina (HCC) due to hypertension  06/13/2019   HTN (hypertension) 06/13/2019   Tobacco use disorder 06/13/2019   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 06/13/19  06/13/2019   LV dysfunction  06/13/2019   NSTEMI (non-ST elevated myocardial infarction) (HCC) 06/12/2019   Knee pain, right 06/05/2017   Depression, major, single episode, mild (HCC) 06/05/2017   GERD (gastroesophageal reflux disease) 06/05/2017   Obesity, morbid, BMI 40.0-49.9 (HCC) 06/05/2017    ONSET DATE: 06/29/2023 onset 06/26/23  REFERRING DIAG: I63.9 (ICD-10-CM) - Acute ischemic stroke  THERAPY DIAG:  Frontal lobe and executive function deficit  Other lack of coordination  Rationale for Evaluation and Treatment: Rehabilitation  SUBJECTIVE:   SUBJECTIVE STATEMENT: She reports being sore - feet (neuropathy), hands/R thumb, neck, shoulders.  Had a headache this morning but took Tylenol.  She reports being more sad/tearful over the past couple of weeks.   Pt accompanied by: self  PERTINENT  HISTORY:   History of depression, hypertension, hypertensive cardiomyopathy and CAD, admitted 06/26/23 with sudden onset of right-sided weakness and speech difficulties.  Patient was treated for her stroke with TNK.  She was found to have multiple infarcts within the left MCA territory and MCA/PCA watershed territory on MRI.     Acute Ischemic Infarct: multiple infarcts within the left MCA territory and MCA/PCA watershed territory s/p TNK Etiology:  likely  cardioembolic (given multiple infarct pattern and Hx of cardiomyopathy)   PRECAUTIONS: None  WEIGHT BEARING RESTRICTIONS: No  PAIN:  Are you having pain? Took Tylenol at 10:30 AM Yes: NPRS scale: 8/10 Pain location: Neck/shoulders are the worst, next is hands and then feet Pain description: Like I worked out really hard Aggravating factors: Loudness of grandchildren Relieving factors: relaxing  FALLS: Has patient fallen in last 6 months? 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  IADLs: Driving: Still driving, taking it easy though ie) no long drives and hasn't gone back to work Shopping: [employer] yesterday and felt wiped out by 12:30 Light housekeeping: Has resumed light housekeeping.  Has a dog to take care of also. Meal Prep: She has been doing this but if she is tired - she reports taking more rests than usual Community mobility: Ind without AE Medication management: Daughter fills her medicine pill box due to mistakes when she has done it. Financial management: Patient has stuff written on chalkbaord to help her and daughter (who works at [employer]) Handwriting:  Higher education careers adviser gets tired after journalling around 1/2 page or so compared to PLOF  MOBILITY STATUS: Independent - no AE  POSTURE COMMENTS:  forward head Sitting balance: WNL  ACTIVITY TOLERANCE: Activity tolerance: Tires more easily  FUNCTIONAL OUTCOME MEASURES: FOTO: 74   UPPER EXTREMITY ROM:    UE Active ROM - WFL    UPPER EXTREMITY MMT:     MMT Right eval Left eval  Shoulder flexion 4 3+   Shoulder abduction    Shoulder adduction    Shoulder extension    Shoulder internal rotation    Shoulder external rotation    Middle trapezius    Lower trapezius    Elbow flexion 4 3+  Elbow extension    Wrist flexion    Wrist extension    Wrist ulnar deviation    Wrist radial deviation    Wrist pronation    Wrist supination    (Blank rows = not tested)  HAND FUNCTION: Grip strength: Right: 60.4, 68.3, 72.7 lbs; Left: 46.5, 54.8, 55.7 lbs Average: Right: 67.1 lbs, Left: 52.3 lbs  COORDINATION: 07/22/2023: writing - 100% legible  9-hole peg: Right - 22 seconds; Left - 21 seconds  SENSATION: WFL  EDEMA: NA  MUSCLE TONE: WFL  COGNITION: Overall cognitive status: Impaired Patient completed portions of the Montreal Cognitive Assessment [facility]) and is noted to have difficulty with immediate memory and delayed recall, requiring information repeated, a category cue and even multiple choice for recall. In addition, she had difficulty with attention for subtracting 7 serially starting at 60 ie) stated 53, 46, 39, 27 (X), 20 (X)  Delayed recall 3/5  VISION: Subjective report: No changes Baseline vision: No visual deficits Visual history:  NA  VISION ASSESSMENT: Not tested  Patient has no difficulty with activities due to visual impairments.  PERCEPTION: Not tested  PRAXIS: Not tested  OBSERVATIONS: Pt ambulates with no AE and no loss of balance. The pt appears well kept and has fingernails don (by her report, she applied the nails herself).     TODAY'S TREATMENT:                   - Therapeutic exercises completed for duration as noted below including:  OT reviewed bilateral green theraputty exercises (search, rolling, grip, pinch, pinch and pull, and key grip) as noted in patient instructions for coordination and strength. Pt required min cueing for proper completion.  OT initiated green Thera-Band HEP including shoulder flexion, horizontal abd/add, bicep curl, and  tricep extension to promote strengthening of affected extremity and overall endurance as noted in pt instructions.    For hand strength, pt completed opening of various jars, which pt completed with only mild difficulty.   PATIENT EDUCATION: Education details: Putty and theraband HEPs Person educated: Patient Education method: Explanation, Verbal cues, and Handouts Education comprehension: verbalized understanding, returned demonstration, verbal cues required, and needs further education  HOME EXERCISE PROGRAM: 07/15/23: Memory Compensation Strategies 07/22/2023: green putty 07/29/2023: green theraband  GOALS: Goals reviewed with patient? Yes - will review specific goals at next appt.  LONG TERM GOALS: Target date: 08/14/23  Patient will demonstrate updated UE HEP with visual cues/images for proper execution - including theraband and putty. Baseline: New to outpt OT. Patient tires easily Goal status: IN Progress  2.  Pt will write a page in her journal with no significant decrease in size and maintain 100% legibility. Baseline: Legibility - TBD but pt report tiring after 1/2 page at this time. Goal status: MET  3.  Pt will verbalize understanding of 3-5 ways to keep thinking skills sharp and ways to compensate for STM changes. Baseline: Self-reported need for help with her memory Goal status: MET - 07/22/2023 07/15/23: Provided WARM Memory Strategies handout  4.  Pt will be able to sort 6+ pill bottles successfully into pillbox without duplicating medications. Baseline: Daughter performs task for her due to mistakes. Goal status: IN progress  5.  Pt will report improved comfort in BUEs through gentle ROM stretching and strength HEP. Baseline: Dull ache in L arm at eval 4/10 (took Tylenol before coming).  Increased pain since her stroke Goal status: IN Progress  6.  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   Baseline: Delayed Recall 3/5 Goal status: IN Progress  ASSESSMENT:  CLINICAL IMPRESSION: Patient demonstrates good understanding of HEP as needed to progress towards goals.  Added upper body strengthening to help with weakness and overall endurance. She would benefit from functional activity completion to simulate work-related tasks. Demonstrates   PERFORMANCE DEFICITS: in functional skills including IADLs, coordination, dexterity, strength, pain, Fine motor control, endurance, and UE functional use, cognitive skills including attention, learn, memory, problem solving, safety awareness, sequencing, and thought, and psychosocial skills including coping strategies, environmental adaptation, and routines and behaviors.   IMPAIRMENTS: are limiting patient from IADLs, work, and leisure.   CO-MORBIDITIES: may have co-morbidities  that affects occupational performance. Patient will benefit from skilled OT to address above impairments and improve overall function.  REHAB POTENTIAL: Excellent  PLAN:  OT  FREQUENCY: 1x/week  OT DURATION: 4 weeks  PLANNED INTERVENTIONS: self care/ADL training, therapeutic exercise, therapeutic activity, neuromuscular re-education, moist heat, patient/family education, cognitive remediation/compensation, energy conservation, coping strategies training, and DME and/or AE instructions  RECOMMENDED OTHER SERVICES: Patient may benefit from ST referral - pt aware of suggestion and will determine need at next appt s/p implementing Memory Strategy Suggestions.  CONSULTED AND AGREED WITH PLAN OF CARE: Patient  PLAN FOR NEXT SESSION:  HEP - strength/coordination/endurance - review theraband   Strategies for medication management  Consider ST referral -- depending on finances  Victorino Sparrow, OT 08/05/2023, 5:45 PM

## 2023-08-05 NOTE — Patient Instructions (Signed)

## 2023-08-06 ENCOUNTER — Encounter: Payer: Self-pay | Admitting: Neurology

## 2023-08-06 ENCOUNTER — Other Ambulatory Visit: Payer: Self-pay

## 2023-08-06 ENCOUNTER — Ambulatory Visit (INDEPENDENT_AMBULATORY_CARE_PROVIDER_SITE_OTHER): Payer: Self-pay | Admitting: Neurology

## 2023-08-06 VITALS — BP 129/75 | HR 56 | Ht 60.0 in | Wt 188.4 lb

## 2023-08-06 DIAGNOSIS — F32A Depression, unspecified: Secondary | ICD-10-CM

## 2023-08-06 DIAGNOSIS — I639 Cerebral infarction, unspecified: Secondary | ICD-10-CM

## 2023-08-06 MED ORDER — CLOPIDOGREL BISULFATE 75 MG PO TABS
75.0000 mg | ORAL_TABLET | Freq: Every day | ORAL | 3 refills | Status: DC
Start: 2023-08-06 — End: 2024-08-16
  Filled 2023-08-06 – 2023-09-18 (×2): qty 90, 90d supply, fill #0
  Filled 2024-01-06: qty 90, 90d supply, fill #1
  Filled 2024-04-20: qty 90, 90d supply, fill #2
  Filled 2024-05-04 – 2024-05-09 (×2): qty 90, 90d supply, fill #0
  Filled 2024-08-05: qty 90, 90d supply, fill #1

## 2023-08-06 MED ORDER — DULOXETINE HCL 30 MG PO CPEP
30.0000 mg | ORAL_CAPSULE | Freq: Every day | ORAL | 0 refills | Status: DC
  Filled 2023-08-06: qty 30, 30d supply, fill #0

## 2023-08-06 MED ORDER — DULOXETINE HCL 60 MG PO CPEP
60.0000 mg | ORAL_CAPSULE | Freq: Every day | ORAL | 3 refills | Status: DC
  Filled 2023-08-06: qty 90, 90d supply, fill #0

## 2023-08-06 NOTE — Progress Notes (Signed)
Chief Complaint  Patient presents with   Follow-up    Rm 14. With daughter, [name]. Confusion has been an issue, pain in bottom of feet, following the stroke, taking up to four extra strength tylenol, some coordination issues, this morning coughed up some blood, but has not smoked or drinking since 08.23.2024. Wants to discuss work, patient is an Public house manager.       ASSESSMENT AND PLAN  Rileigh June Black is a 52 y.o. female   Stroke August 2024  Multiple infarction within the left MCA, MCA PCA Territory, status post TNK,  History of cardiomyopathy, suspicious for cardioembolic event,  Complete 30 days cardiac monitoring, cardiology follow-up is pending,  Will continue on current dual antiplatelet agent aspirin 81+ Plavix 75 mg for now, depend on the cardiac monitoring result, we will make decision about long-term anticoagulation versus antiplatelet agent treatment, if there is no cardiac arrhythmia found, she should be on single antiplatelet agent, prior to her stroke, she was not on any, if 30 days cardiac monitoring is negative, would suggest loop recorder, aspirin 81 mg daily.   Fatigue, body achy pain, anxiety,  Cymbalta 30 mg daily, titrating to 60 mg daily, may continue managed by her primary care physician,   DIAGNOSTIC DATA (LABS, IMAGING, TESTING) - I reviewed patient records, labs, notes, testing and imaging myself where available.   MEDICAL HISTORY:  Taylor Black, is a 52 year old female, seen in request by her primary care physician Dr. [place], Betty for evaluation of bilateral feet paresthesia, accompanied by her daughter [name] at today's visit August 06, 2023  History is obtained from the patient and review of electronic medical records. I personally reviewed pertinent available imaging films in PACS.   PMHx of  Smoke 1ppd Alcohol 6-12 cans beer daily CAD GERD History of GI bleeding,   She used to work as a Orthoptist, drinks beers regularly, also  smoke about a pack a day, hospital admission on June 26, 2023, she was in the sleep, when daughter checked on her, she was found difficulty changing her position, face turned blue, had sudden onset right side weakness, speech difficulty, 911 was called  MRI of the brain showed multiple infarction within the left MCA territory, MCA PCA watershed territory, received TNK, consider likely cardioembolic, she did have a history of cardiomyopathy  Echocardiogram and TEE showed normal ejection fraction, no intra-arterial shunt,  She just completed her 30 days cardiac monitoring, no report yet, will follow-up with cardiologist,  LDL 98, A1c 6.1, she was treated with aspirin 81+ Plavix 75 mg daily, depend on the cardiac monitor result, we will make decision about anticoagulation versus antiplatelet agent treatment  She had a history of cardiomyopathy, hospitalization and cardiac cath in August 2020 showed ejection fraction 40 to 45% with global hypokinesia, cardiac cath showed eccentric proximal LAD 30 to 40% range  She used to work as a Orthoptist, now was not able to go back to her previous job, emotional crying during today's interview, complains of loss of appetite, tired quickly, spent a lot of time sleeping, lack of motivation, did quit smoking drinking since her stroke  PHYSICAL EXAM:   Vitals:   08/06/23 0926 08/06/23 0937  BP: (!) 153/89 129/75  Pulse: 73 (!) 56  Weight: 188 lb 6.4 oz (85.5 kg)   Height: 5' (1.524 m)    Not recorded     Body mass index is 36.79 kg/m.  PHYSICAL EXAMNIATION:  Gen: NAD, conversant, well nourised, well groomed  Cardiovascular: Regular rate rhythm, no peripheral edema, warm, nontender. Eyes: Conjunctivae clear without exudates or hemorrhage Neck: Supple, no carotid bruits. Pulmonary: Clear to auscultation bilaterally   NEUROLOGICAL EXAM:  MENTAL STATUS: Speech/cognition: Emotional tearful middle-age female, awake, alert,  oriented to history taking and casual conversation CRANIAL NERVES: CN II: Visual fields are full to confrontation. Pupils are round equal and briskly reactive to light. CN III, IV, VI: extraocular movement are normal. No ptosis. CN V: Facial sensation is intact to light touch CN VII: Face is symmetric with normal eye closure  CN VIII: Hearing is normal to causal conversation. CN IX, X: Phonation is normal. CN XI: Head turning and shoulder shrug are intact  MOTOR: There is no pronator drift of out-stretched arms. Muscle bulk and tone are normal. Muscle strength is normal.  REFLEXES: Reflexes are 2+ and symmetric at the biceps, triceps, knees, and ankles. Plantar responses are flexor.  SENSORY: Intact to light touch, pinprick and vibratory sensation are intact in fingers and toes.  COORDINATION: There is no trunk or limb dysmetria noted.  GAIT/STANCE: Posture is normal. Gait is steady with normal steps, base, arm swing, and turning. Heel and toe walking are normal. Tandem gait is normal.  Romberg is absent.  REVIEW OF SYSTEMS:  Full 14 system review of systems performed and notable only for as above All other review of systems were negative.   ALLERGIES: Allergies  Allergen Reactions   Morphine And Codeine Hives and Other (See Comments)    Profuse sweating    HOME MEDICATIONS: Current Outpatient Medications  Medication Sig Dispense Refill   acetaminophen (TYLENOL) 500 MG tablet Take 2,000 mg by mouth daily as needed for moderate pain, headache or fever.     aspirin EC 81 MG tablet Take 1 tablet (81 mg total) by mouth daily. Swallow whole. 120 tablet 12   atorvastatin (LIPITOR) 40 MG tablet Take 1 tablet (40 mg total) by mouth daily. 90 tablet 1   carvedilol (COREG) 3.125 MG tablet Take 1 tablet (3.125 mg total) by mouth 2 (two) times daily with a meal. 60 tablet 3   clopidogrel (PLAVIX) 75 MG tablet Take 1 tablet (75 mg total) by mouth daily. 90 tablet 1   losartan  (COZAAR) 50 MG tablet Take 1 tablet (50 mg total) by mouth daily. 90 tablet 1   Multiple Vitamin (MULTIVITAMIN WITH MINERALS) TABS tablet Take 1 tablet by mouth daily. 130 tablet 1   nicotine (NICODERM CQ - DOSED IN MG/24 HOURS) 14 mg/24hr patch Place 1 patch (14 mg total) onto the skin daily as needed (if patient wants it. remove prior to sleeping please.). 28 patch 0   traZODone (DESYREL) 50 MG tablet Take 1 tablet (50 mg total) by mouth at bedtime as needed for sleep. 90 tablet 1   thiamine (VITAMIN B1) 100 MG tablet Take 1 tablet (100 mg total) by mouth daily. (Patient not taking: Reported on 08/06/2023) 30 tablet 1   No current facility-administered medications for this visit.    PAST MEDICAL HISTORY: 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  06/13/2019   Chicken pox    Depression    GERD (gastroesophageal reflux disease)    LV dysfunction  06/13/2019    PAST SURGICAL HISTORY: Past Surgical History:  Procedure Laterality Date   BIOPSY  05/29/2020   Procedure: BIOPSY;  Surgeon: Shellia Cleverly, DO;  Location: [facility];  Service: Gastroenterology;;  bowel obstruction  1989   COLONOSCOPY WITH PROPOFOL N/A 05/29/2020   Procedure: COLONOSCOPY WITH PROPOFOL;  Surgeon: Shellia Cleverly, DO;  Location: [facility];  Service: Gastroenterology;  Laterality: N/A;   ESOPHAGOGASTRODUODENOSCOPY (EGD) WITH PROPOFOL N/A 05/29/2020   Procedure: ESOPHAGOGASTRODUODENOSCOPY (EGD) WITH PROPOFOL;  Surgeon: Shellia Cleverly, DO;  Location: [facility];  Service: Gastroenterology;  Laterality: N/A;   LEFT HEART CATH AND CORONARY ANGIOGRAPHY N/A 06/13/2019   Procedure: LEFT HEART CATH AND CORONARY ANGIOGRAPHY;  Surgeon: Lyn Records, MD;  [facility];  Service: Cardiovascular;  Laterality: N/A;   POLYPECTOMY  05/29/2020   Procedure: POLYPECTOMY;  Surgeon: Shellia Cleverly, DO;  Location: [facility];  Service:  Gastroenterology;;   TEE WITHOUT CARDIOVERSION N/A 06/29/2023   Procedure: TRANSESOPHAGEAL ECHOCARDIOGRAM;  Surgeon: Jake Bathe, MD;  [facility];  Service: Cardiovascular;  Laterality: N/A;   UMBILICAL HERNIA REPAIR  2017    FAMILY HISTORY: Family History  Problem Relation Age of Onset   Heart disease Father    Stroke Father    Hypertension Father    Kidney disease Father    Diabetes Father    Alcohol abuse Father    Depression Paternal Grandmother    Kidney disease Paternal Grandmother    Cancer Maternal Grandmother        Cervical    SOCIAL HISTORY: Social History   Socioeconomic History   Marital status: Legally Separated    Spouse name: Not on file   Number of children: Not on file   Years of education: Not on file   Highest education level: Not on file  Occupational History   Not on file  Tobacco Use   Smoking status: Former    Current packs/day: 0.00    Types: Cigarettes    Quit date: 06/26/2023    Years since quitting: 0.1   Smokeless tobacco: Never  Vaping Use   Vaping status: Never Used  Substance and Sexual Activity   Alcohol use: Not Currently    Alcohol/week: 3.0 standard drinks of alcohol    Types: 3 Standard drinks or equivalent per week   Drug use: Yes    Types: Marijuana    Comment: CBD gummies   Sexual activity: Not Currently    Comment: 1st intercourse 17 yo-5 partners  Other Topics Concern   Not on file  Social History Narrative   Not on file   Social Determinants of Health   Financial Resource Strain: Not on file  Food Insecurity: No Food Insecurity (06/26/2023)   Hunger Vital Sign    Worried About Running Out of Food in the Last Year: Never true    Ran Out of Food in the Last Year: Never true  Transportation Needs: No Transportation Needs (06/26/2023)   PRAPARE - Administrator, Civil Service (Medical): No    Lack of Transportation (Non-Medical): No  Physical Activity: Not on file  Stress: Not on file   Social Connections: Not on file  Intimate Partner Violence: Not At Risk (06/26/2023)   Humiliation, Afraid, Rape, and Kick questionnaire    Fear of Current or Ex-Partner: No    Emotionally Abused: No    Physically Abused: No    Sexually Abused: No      Levert Feinstein, M.D. Ph.D.  [facility] [address], [address] [place], [place] [address] Ph: [phone] Fax: [phone]  CC:  [place], [name], MD [address] [place],  [place] [address]  Claiborne Rigg, NP

## 2023-08-12 ENCOUNTER — Ambulatory Visit: Payer: Self-pay | Admitting: Occupational Therapy

## 2023-08-12 DIAGNOSIS — M6281 Muscle weakness (generalized): Secondary | ICD-10-CM

## 2023-08-12 DIAGNOSIS — R41840 Attention and concentration deficit: Secondary | ICD-10-CM

## 2023-08-12 DIAGNOSIS — R41844 Frontal lobe and executive function deficit: Secondary | ICD-10-CM

## 2023-08-12 NOTE — Therapy (Unsigned)
OUTPATIENT OCCUPATIONAL THERAPY NEURO TREATMENT & DISCHARGE NOTE  Patient Name: Taylor Black MRN: [id number] DOB:06-02-1971, 52 y.o., female Today's Date: 08/12/2023  PCP: [place], [name], MD REFERRING PROVIDER: Micki Riley, MD  END OF SESSION:  OT End of Session - 08/12/23 1234     Visit Number 5    Number of Visits 7   including evaluation   Authorization Type Self Pay    OT Start Time 1235    OT Stop Time 1315    OT Time Calculation (min) 40 min    Activity Tolerance Patient tolerated treatment well    Behavior During Therapy [facility] for tasks assessed/performed              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  06/13/2019   Chicken pox    Depression    GERD (gastroesophageal reflux disease)    LV dysfunction  06/13/2019   Past Surgical History:  Procedure Laterality Date   BIOPSY  05/29/2020   Procedure: BIOPSY;  Surgeon: Shellia Cleverly, DO;  [facility];  Service: Gastroenterology;;   bowel obstruction  1989   COLONOSCOPY WITH PROPOFOL N/A 05/29/2020   Procedure: COLONOSCOPY WITH PROPOFOL;  Surgeon: Shellia Cleverly, DO;  [facility];  Service: Gastroenterology;  Laterality: N/A;   ESOPHAGOGASTRODUODENOSCOPY (EGD) WITH PROPOFOL N/A 05/29/2020   Procedure: ESOPHAGOGASTRODUODENOSCOPY (EGD) WITH PROPOFOL;  Surgeon: Shellia Cleverly, DO;  [facility];  Service: Gastroenterology;  Laterality: N/A;   LEFT HEART CATH AND CORONARY ANGIOGRAPHY N/A 06/13/2019   Procedure: LEFT HEART CATH AND CORONARY ANGIOGRAPHY;  Surgeon: Lyn Records, MD;  [facility];  Service: Cardiovascular;  Laterality: N/A;   POLYPECTOMY  05/29/2020   Procedure: POLYPECTOMY;  Surgeon: Shellia Cleverly, DO;  [facility];  Service: Gastroenterology;;   TEE WITHOUT CARDIOVERSION N/A 06/29/2023   Procedure: TRANSESOPHAGEAL ECHOCARDIOGRAM;  Surgeon: Jake Bathe, MD;  [facility];  Service: Cardiovascular;  Laterality: N/A;   UMBILICAL HERNIA REPAIR  2017   Patient Active Problem List   Diagnosis Date Noted   Depression 08/06/2023   Acute ischemic stroke (HCC) 06/26/2023   Polyp of transverse colon    Polyp of sigmoid colon    Polyp of rectum    Grade II hemorrhoids    Gastritis and gastroduodenitis    Hiatal hernia    Iron deficiency anemia    Diverticulosis of colon without hemorrhage    GI bleeding 05/26/2020   Unstable angina (HCC) due to hypertension  06/13/2019   HTN (hypertension) 06/13/2019   Tobacco use disorder 06/13/2019   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 06/13/19  06/13/2019   LV dysfunction  06/13/2019   NSTEMI (non-ST elevated myocardial infarction) (HCC) 06/12/2019   Knee pain, right 06/05/2017   Depression, major, single episode, mild (HCC) 06/05/2017   GERD (gastroesophageal reflux disease) 06/05/2017   Obesity, morbid, BMI 40.0-49.9 (HCC) 06/05/2017    ONSET DATE: 06/29/2023 onset 06/26/23  REFERRING DIAG: I63.9 (ICD-10-CM) - Acute ischemic stroke  THERAPY DIAG:  Frontal lobe and executive function deficit  Attention and concentration deficit  Muscle weakness (generalized)  Rationale for Evaluation and Treatment: Rehabilitation  SUBJECTIVE:   SUBJECTIVE STATEMENT:  Stretches and massages She reports being sore - feet (neuropathy), hands/R thumb, neck, shoulders.  Had a headache this morning but took Tylenol.  She reports being more  sad/tearful over the past couple of weeks also.   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  Pt accompanied by: self  PERTINENT HISTORY:   History of depression, hypertension, hypertensive cardiomyopathy and CAD, admitted 06/26/23 with sudden onset of right-sided weakness and speech difficulties.  Patient was treated for  her stroke with TNK.  She was found to have multiple infarcts within the left MCA territory and MCA/PCA watershed territory on MRI.     Acute Ischemic Infarct: multiple infarcts within the left MCA territory and MCA/PCA watershed territory s/p TNK Etiology:  likely cardioembolic (given multiple infarct pattern and Hx of cardiomyopathy)   PRECAUTIONS: None  WEIGHT BEARING RESTRICTIONS: No  PAIN:  Are you having pain? Took Tylenol at 10:30 AM Yes: NPRS scale: 5/10 Pain location: Neck/shoulders are the worst, next is hands and then feet Pain description: Like I worked out really hard Aggravating factors: Loudness of grandchildren Relieving factors: relaxing  FALLS: Has patient fallen in last 6 months? 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  IADLs: Driving: Still driving, taking it easy though ie) no long drives and hasn't gone back to work Shopping: [employer] yesterday and felt wiped out by 12:30 Light housekeeping: Has resumed light housekeeping.  Has a dog to take care of also. Meal Prep: She has been doing this but if she is tired - she reports taking more rests than usual Community mobility: Ind without AE Medication management: Daughter fills her medicine pill box due to mistakes when she has done it. Financial management: Patient has stuff written on chalkbaord to help her and daughter (who works at [employer]) Handwriting:  Higher education careers adviser gets tired after journalling around 1/2 page or so compared to PLOF  MOBILITY STATUS: Independent - no AE  POSTURE COMMENTS:   forward head Sitting balance: WNL  ACTIVITY TOLERANCE: Activity tolerance: Tires more easily  FUNCTIONAL OUTCOME MEASURES: FOTO: 74   UPPER EXTREMITY ROM:    UE Active ROM - WFL    UPPER EXTREMITY MMT:     MMT Right eval Left eval  Shoulder flexion 4 3+  Shoulder abduction    Shoulder adduction    Shoulder extension    Shoulder internal rotation    Shoulder external rotation    Middle trapezius    Lower trapezius    Elbow flexion 4 3+  Elbow extension    Wrist flexion    Wrist extension    Wrist ulnar deviation    Wrist radial deviation    Wrist pronation    Wrist supination    (Blank rows = not tested)  HAND FUNCTION: Grip strength: Right: 60.4, 68.3, 72.7 lbs; Left: 46.5, 54.8, 55.7 lbs Average: Right: 67.1 lbs, Left: 52.3 lbs  COORDINATION: 07/22/2023: writing - 100% legible  9-hole peg: Right - 22 seconds; Left - 21 seconds  SENSATION: WFL  EDEMA: NA  MUSCLE TONE: WFL  COGNITION: Overall cognitive status: Impaired Patient completed portions of the [facility]  Cognitive Assessment (MOCA) and is noted to have difficulty with immediate memory and delayed recall, requiring information repeated, a category cue and even multiple choice for recall. In addition, she had difficulty with attention for subtracting 7 serially starting at 60 ie) stated 53, 46, 39, 27 (X), 20 (X)  Delayed recall 3/5  VISION: Subjective report: No changes Baseline vision: No visual deficits Visual history:  NA  VISION ASSESSMENT: Not tested  Patient has no difficulty with activities due to visual impairments.  PERCEPTION: Not tested  PRAXIS: Not tested  OBSERVATIONS: Pt ambulates with no AE and no loss of balance. The pt appears well kept and has fingernails don (by her report, she applied the nails herself).     TODAY'S TREATMENT:                    Self Care:  Pt was tearful at beginning of session talking about her deficits and concerns re: returning to work etc.   It appears as if her pain and discomfort may be coming form stress and initiated education re: relaxation techniques and patient reports going to her room or the porch to get away from the noise and bustle of the home which is what she describes as making her 'pain' worse (ie noise of her grandchildren).     She is engaged in reviewing Memory strategies and provided further information about strategies to improve  attention and memory including during conversations ie) eye contact, minimizing distraction , writing down information etc.  She had her journal with her and is writing > 1/2 page of information daily with good legibility.  Also reviewed medication management as patient reports she was missing a medicine in her pill box today. Beads and small pegs used to simulate pills with patient encouraged to pour some on a washcloth (with cues for find a contrasting color to 'white' pills), count out # of pills needed ie) 7 for 1x/day or 14 for 2x/day, return the rest ot the pill bottle, count again to confirm correct number of pills and then place them in the pill box.  If an extra pill is on the washcloth,she is to check where she missed one.  PATIENT EDUCATION: Education details: Pill sorting  Person educated: Patient Education method: Explanation, Verbal cues, and Handouts Education comprehension: verbalized understanding, returned demonstration, verbal cues required, and needs further education  HOME EXERCISE PROGRAM: 07/15/23: Memory Compensation Strategies 07/22/2023: green putty 07/29/2023: green theraband  GOALS: Goals reviewed with patient? Yes - will review specific goals at next appt.  LONG TERM GOALS: Target date: 08/14/23  Patient will demonstrate updated UE HEP with visual cues/images for proper execution - including theraband and putty. Baseline: New to outpt OT. Patient tires easily Goal status: IN Progress  2.  Pt will write a page in her journal with no significant decrease in  size and maintain 100% legibility. Baseline: Legibility - TBD but pt report tiring after 1/2 page at this time. Goal status: MET  3.  Pt will verbalize understanding of 3-5 ways to keep thinking skills sharp and ways to compensate for STM changes. Baseline: Self-reported need for help with her memory Goal status: MET - 07/22/2023 07/15/23: Provided WARM Memory Strategies handout 08/05/23: Provided Strategies for Improving Your Attention and Memory handout  4.  Pt will be able to sort 6+ pill bottles successfully into pillbox without duplicating medications. Baseline: Daughter performs task for her due to mistakes. Goal status: IN progress  5.  Pt  will report improved comfort in BUEs through gentle ROM stretching and strength HEP. Baseline: Dull ache in L arm at eval 4/10 (took Tylenol before coming).  Increased pain since her stroke Goal status: IN Progress 08/05/23: Began relaxation education  6.  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  Baseline: Delayed Recall 3/5 Goal status: IN Progress  ASSESSMENT:  CLINICAL IMPRESSION: Patient demonstrates some increase emotional lability and is encouraged to talk with the doctor tomorrow re:her concerns.  She has a good understanding of HEP and has improved overall writing.  She is given specific ideas to help with medication management today and responded well to suggestions to avoid mistakes with sorting meds. She would benefit from further functional activity completion to simulate work-related tasks.   PERFORMANCE DEFICITS: in functional skills including IADLs, coordination, dexterity, strength, pain, Fine motor control, endurance, and UE functional use, cognitive skills including attention, learn, memory, problem solving, safety awareness, sequencing, and thought, and psychosocial skills including coping strategies, environmental adaptation, and routines and behaviors.    IMPAIRMENTS: are limiting patient from IADLs, work, and leisure.   CO-MORBIDITIES: may have co-morbidities  that affects occupational performance. Patient will benefit from skilled OT to address above impairments and improve overall function.  REHAB POTENTIAL: Excellent  PLAN:  OT FREQUENCY: 1x/week  OT DURATION: 4 weeks  PLANNED INTERVENTIONS: self care/ADL training, therapeutic exercise, therapeutic activity, neuromuscular re-education, moist heat, patient/family education, cognitive remediation/compensation, energy conservation, coping strategies training, and DME and/or AE instructions  RECOMMENDED OTHER SERVICES: Patient may benefit from ST referral - pt aware of suggestion and will determine need at next appt s/p implementing Memory Strategy Suggestions.  CONSULTED AND AGREED WITH PLAN OF CARE: Patient  PLAN FOR NEXT SESSION:   Relaxation/stress reduction ideas/techniques Review HEP - strength/coordination/endurance - review theraband  Check Strategies for medication management  Consider ST referral -- depending on finances  Victorino Sparrow, OT 08/12/2023, 6:20 PM

## 2023-08-18 ENCOUNTER — Other Ambulatory Visit: Payer: Self-pay

## 2023-08-24 ENCOUNTER — Other Ambulatory Visit: Payer: Self-pay

## 2023-08-26 ENCOUNTER — Other Ambulatory Visit: Payer: Self-pay

## 2023-08-26 ENCOUNTER — Ambulatory Visit: Payer: Self-pay | Attending: Student

## 2023-08-26 DIAGNOSIS — I639 Cerebral infarction, unspecified: Secondary | ICD-10-CM

## 2023-08-26 DIAGNOSIS — I4891 Unspecified atrial fibrillation: Secondary | ICD-10-CM
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
## 2023-09-17 ENCOUNTER — Ambulatory Visit: Payer: Self-pay | Attending: Cardiovascular Disease | Admitting: Cardiovascular Disease

## 2023-09-17 ENCOUNTER — Other Ambulatory Visit: Payer: Self-pay

## 2023-09-17 ENCOUNTER — Encounter: Payer: Self-pay | Admitting: Cardiovascular Disease

## 2023-09-17 VITALS — BP 168/100 | HR 78 | Ht 60.0 in | Wt 173.2 lb

## 2023-09-17 DIAGNOSIS — I10 Essential (primary) hypertension: Secondary | ICD-10-CM

## 2023-09-17 DIAGNOSIS — F17200 Nicotine dependence, unspecified, uncomplicated: Secondary | ICD-10-CM

## 2023-09-17 DIAGNOSIS — I519 Heart disease, unspecified: Secondary | ICD-10-CM

## 2023-09-17 DIAGNOSIS — I2510 Atherosclerotic heart disease of native coronary artery without angina pectoris: Secondary | ICD-10-CM

## 2023-09-17 DIAGNOSIS — I639 Cerebral infarction, unspecified: Secondary | ICD-10-CM

## 2023-09-17 MED ORDER — OLMESARTAN MEDOXOMIL 20 MG PO TABS
20.0000 mg | ORAL_TABLET | Freq: Every day | ORAL | 3 refills | Status: DC
  Filled 2023-09-17: qty 90, 90d supply, fill #0

## 2023-09-17 NOTE — Patient Instructions (Signed)
Medication Instructions:  Your physician has recommended you make the following change in your medication:  1-STOP losartan 2-START Benicar 20 mg by mouth daily.  *If you need a refill on your cardiac medications before your next appointment, please call your pharmacy*  Lab Work: If you have labs (blood work) drawn today and your tests are completely normal, you will receive your results only by: MyChart Message (if you have MyChart) OR A paper copy in the mail If you have any lab test that is abnormal or we need to change your treatment, we will call you to review the results.  Testing/Procedures: Non-Cardiac CT scanning for lung cancer screening, (CAT scanning), is a noninvasive, special x-ray that produces cross-sectional images of the body using x-rays and a computer. CT scans help physicians diagnose and treat medical conditions. For some CT exams, a contrast material is used to enhance visibility in the area of the body being studied. CT scans provide greater clarity and reveal more details than regular x-ray exams.    Follow-Up: At [facility], you and your health needs are our priority.  As part of our continuing mission to provide you with exceptional heart care, we have created designated Provider Care Teams.  These Care Teams include your primary Cardiologist (physician) and Advanced Practice Providers (APPs -  Physician Assistants and Nurse Practitioners) who all work together to provide you with the care you need, when you need it.  We recommend signing up for the patient portal called "MyChart".  Sign up information is provided on this After Visit Summary.  MyChart is used to connect with patients for Virtual Visits (Telemedicine).  Patients are able to view lab/test results, encounter notes, upcoming appointments, etc.  Non-urgent messages can be sent to your provider as well.   To learn more about what you can do with MyChart, go to [email or web address].    Your  next appointment:   6 month(s)  Provider:   Charlton Haws, MD

## 2023-09-18 ENCOUNTER — Other Ambulatory Visit: Payer: Self-pay

## 2023-09-18 ENCOUNTER — Other Ambulatory Visit (HOSPITAL_COMMUNITY): Payer: Self-pay

## 2023-09-18 ENCOUNTER — Ambulatory Visit (HOSPITAL_COMMUNITY)
Admission: RE | Admit: 2023-09-18 | Discharge: 2023-09-18 | Disposition: A | Discharge status: Home / Self Care | Payer: Self-pay | Source: Ambulatory Visit | Attending: Cardiovascular Disease | Admitting: Cardiovascular Disease

## 2023-09-18 DIAGNOSIS — F17200 Nicotine dependence, unspecified, uncomplicated: Secondary | ICD-10-CM | POA: Insufficient documentation

## 2023-09-23 ENCOUNTER — Other Ambulatory Visit (HOSPITAL_COMMUNITY): Payer: Self-pay

## 2023-10-08 ENCOUNTER — Other Ambulatory Visit: Payer: Self-pay

## 2023-10-08 NOTE — Patient Outreach (Signed)
 First telephone outreach attempt to obtain mRS. No answer. Left message for returned call.  Vanice Sarah Care Management Assistant [phone]

## 2023-10-09 ENCOUNTER — Other Ambulatory Visit: Payer: Self-pay

## 2023-10-09 NOTE — Patient Outreach (Signed)
 Second telephone outreach attempt to obtain mRS. No answer. Left message for returned call.  Vanice Sarah Care Management Assistant [phone]

## 2023-10-12 ENCOUNTER — Other Ambulatory Visit: Payer: Self-pay

## 2023-10-12 NOTE — Patient Outreach (Signed)
Telephone outreach to patient to obtain mRS was successfully completed. MRS= 2  Vanice Sarah Care Management Assistant [phone]

## 2023-10-23 ENCOUNTER — Other Ambulatory Visit: Payer: Self-pay

## 2023-10-26 ENCOUNTER — Other Ambulatory Visit: Payer: Self-pay

## 2023-10-27 ENCOUNTER — Encounter: Payer: Self-pay | Admitting: Nurse Practitioner

## 2023-10-27 ENCOUNTER — Other Ambulatory Visit: Payer: Self-pay

## 2023-10-27 ENCOUNTER — Ambulatory Visit: Payer: Self-pay | Attending: Nurse Practitioner | Admitting: Nurse Practitioner

## 2023-10-27 VITALS — BP 144/85 | HR 61 | Ht 60.0 in | Wt 173.0 lb

## 2023-10-27 DIAGNOSIS — J40 Bronchitis, not specified as acute or chronic: Secondary | ICD-10-CM

## 2023-10-27 DIAGNOSIS — I10 Essential (primary) hypertension: Secondary | ICD-10-CM

## 2023-10-27 DIAGNOSIS — F5101 Primary insomnia: Secondary | ICD-10-CM

## 2023-10-27 DIAGNOSIS — E7800 Pure hypercholesterolemia, unspecified: Secondary | ICD-10-CM

## 2023-10-27 DIAGNOSIS — Z1231 Encounter for screening mammogram for malignant neoplasm of breast: Secondary | ICD-10-CM

## 2023-10-27 MED ORDER — ALBUTEROL SULFATE HFA 108 (90 BASE) MCG/ACT IN AERS
2.0000 | INHALATION_SPRAY | Freq: Four times a day (QID) | RESPIRATORY_TRACT | 2 refills | Status: AC | PRN
  Filled 2023-10-27: qty 6.7, 25d supply, fill #0
  Filled 2024-02-16: qty 6.7, 25d supply, fill #1
  Filled 2024-05-04: qty 6.7, 25d supply, fill #0

## 2023-10-27 MED ORDER — PROMETHAZINE-DM 6.25-15 MG/5ML PO SYRP
5.0000 mL | ORAL_SOLUTION | Freq: Four times a day (QID) | ORAL | 0 refills | Status: DC | PRN
  Filled 2023-10-27: qty 240, 12d supply, fill #0

## 2023-10-27 MED ORDER — ATORVASTATIN CALCIUM 40 MG PO TABS
40.0000 mg | ORAL_TABLET | Freq: Every day | ORAL | 1 refills | Status: DC
  Filled 2024-01-11: qty 90, 90d supply, fill #0
  Filled 2024-04-20: qty 90, 90d supply, fill #1
  Filled 2024-05-04 – 2024-05-09 (×2): qty 90, 90d supply, fill #0

## 2023-10-27 MED ORDER — TRAZODONE HCL 50 MG PO TABS
50.0000 mg | ORAL_TABLET | Freq: Every evening | ORAL | 1 refills | Status: DC | PRN
  Filled 2024-01-11: qty 90, 90d supply, fill #0
  Filled 2024-04-20: qty 90, 90d supply, fill #1
  Filled 2024-05-04 – 2024-05-09 (×2): qty 90, 90d supply, fill #0

## 2023-10-27 MED ORDER — CARVEDILOL 3.125 MG PO TABS
3.1250 mg | ORAL_TABLET | Freq: Two times a day (BID) | ORAL | 3 refills | Status: DC
Start: 2023-10-27 — End: 2023-11-26

## 2023-10-27 MED ORDER — AZITHROMYCIN 250 MG PO TABS
ORAL_TABLET | ORAL | 0 refills | Status: AC
  Filled 2023-10-27: qty 6, 5d supply, fill #0

## 2023-10-27 MED ORDER — OLMESARTAN MEDOXOMIL 40 MG PO TABS
40.0000 mg | ORAL_TABLET | Freq: Every day | ORAL | 1 refills | Status: DC
Start: 2023-10-27 — End: 2024-06-08
  Filled 2023-10-27: qty 90, 90d supply, fill #0
  Filled 2024-01-24 – 2024-01-25 (×2): qty 90, 90d supply, fill #1

## 2023-10-27 NOTE — Progress Notes (Unsigned)
Assessment & Plan:  [name] was seen today for medical management of chronic issues.  Diagnoses and all orders for this visit:  Primary hypertension Increased benicar to 40 mg from 20 mg -     carvedilol (COREG) 3.125 MG tablet; Take 1 tablet (3.125 mg total) by mouth 2 (two) times daily with a meal. -     olmesartan (BENICAR) 40 MG tablet; Take 1 tablet (40 mg total) by mouth daily.  Hypercholesterolemia -     atorvastatin (LIPITOR) 40 MG tablet; Take 1 tablet (40 mg total) by mouth daily. INSTRUCTIONS: Work on a low fat, heart healthy diet and participate in regular aerobic exercise program by working out at least 150 minutes per week; 5 days a week-30 minutes per day. Avoid red meat/beef/steak,  fried foods. junk foods, sodas, sugary drinks, unhealthy snacking, alcohol and smoking.  Drink at least 80 oz of water per day and monitor your carbohydrate intake daily.    Primary insomnia -     traZODone (DESYREL) 50 MG tablet; Take 1 tablet (50 mg total) by mouth at bedtime as needed for sleep.  Breast cancer screening by mammogram -     MM 3D SCREENING MAMMOGRAM BILATERAL BREAST; Future  Bronchitis -     albuterol (VENTOLIN HFA) 108 (90 Base) MCG/ACT inhaler; Inhale 2 puffs into the lungs every 6 (six) hours as needed for wheezing or shortness of breath. -     azithromycin (ZITHROMAX) 250 MG tablet; Take 2 tablets (500 mg total) by mouth daily for 1 day, THEN 1 tablet (250 mg total) daily for 4 days. -     promethazine-dextromethorphan (PROMETHAZINE-DM) 6.25-15 MG/5ML syrup; Take 5 mLs by mouth 4 (four) times daily as needed for cough.    Patient has been counseled on age-appropriate routine health concerns for screening and prevention. These are reviewed and up-to-date. Referrals have been placed accordingly. Immunizations are up-to-date or declined.    Subjective:   Chief Complaint  Patient presents with   Medical Management of Chronic Issues    Taylor Black 52 y.o.  female presents to office today for follow up to HTN  She has a past medical history of Anxiety, 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 06/13/19  (06/13/2019), CM,  Left hemispheric infarct (06-2023), Depression, GERD,  and LV dysfunction  (06/13/2019).     HTN Losartan was switched to benicar 20 mg by cardiology on 09-17-2023 due to patient reporting vomiting with taking losartan. Blood pressure is elevated today. She has labile home readings via her home blood pressure monitor as well. She does endorse taking OTC cold medications due to URI symptoms she had recently and questions whether this could be causing her blood pressure to be elevated.  BP Readings from Last 3 Encounters:  10/27/23 (!) 144/85  09/17/23 (!) 168/100  08/06/23 129/75     Taking turmeric and ginger for generalized pain.   She would like a new referral to neurology but would like to wait until she has insurance. States she did not feel valued as a patient at her last visit for her stroke.    Review of Systems  Constitutional:  Negative for fever, malaise/fatigue and weight loss.  HENT: Negative.  Negative for nosebleeds.   Eyes: Negative.  Negative for blurred vision, double vision and photophobia.  Respiratory:  Positive for cough and sputum production. Negative for shortness of breath and wheezing.   Cardiovascular: Negative.  Negative for chest pain, palpitations and leg swelling.  Gastrointestinal: Negative.  Negative for heartburn, nausea and vomiting.  Musculoskeletal: Negative.  Negative for myalgias.  Neurological: Negative.  Negative for dizziness, focal weakness, seizures and headaches.  Psychiatric/Behavioral: Negative.  Negative for suicidal ideas.     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  06/13/2019   Chicken pox    Depression    GERD (gastroesophageal reflux disease)    LV dysfunction  06/13/2019    Past Surgical History:   Procedure Laterality Date   BIOPSY  05/29/2020   Procedure: BIOPSY;  Surgeon: Shellia Cleverly, DO;  [facility];  Service: Gastroenterology;;   bowel obstruction  1989   COLONOSCOPY WITH PROPOFOL N/A 05/29/2020   Procedure: COLONOSCOPY WITH PROPOFOL;  Surgeon: Shellia Cleverly, DO;  [facility];  Service: Gastroenterology;  Laterality: N/A;   ESOPHAGOGASTRODUODENOSCOPY (EGD) WITH PROPOFOL N/A 05/29/2020   Procedure: ESOPHAGOGASTRODUODENOSCOPY (EGD) WITH PROPOFOL;  Surgeon: Shellia Cleverly, DO;  [facility];  Service: Gastroenterology;  Laterality: N/A;   LEFT HEART CATH AND CORONARY ANGIOGRAPHY N/A 06/13/2019   Procedure: LEFT HEART CATH AND CORONARY ANGIOGRAPHY;  Surgeon: Lyn Records, MD;  [facility];  Service: Cardiovascular;  Laterality: N/A;   POLYPECTOMY  05/29/2020   Procedure: POLYPECTOMY;  Surgeon: Shellia Cleverly, DO;  [facility];  Service: Gastroenterology;;   TEE WITHOUT CARDIOVERSION N/A 06/29/2023   Procedure: TRANSESOPHAGEAL ECHOCARDIOGRAM;  Surgeon: Jake Bathe, MD;  [facility];  Service: Cardiovascular;  Laterality: N/A;   UMBILICAL HERNIA REPAIR  2017    Family History  Problem Relation Age of Onset   Heart disease Father    Stroke Father    Hypertension Father    Kidney disease Father    Diabetes Father    Alcohol abuse Father    Depression Paternal Grandmother    Kidney disease Paternal Grandmother    Cancer Maternal Grandmother        Cervical    Social History Reviewed with no changes to be made today.   Outpatient Medications Prior to Visit  Medication Sig Dispense Refill   acetaminophen (TYLENOL) 500 MG tablet Take 2,000 mg by mouth daily as needed for moderate pain, headache or fever.     aspirin EC 81 MG tablet Take 1 tablet (81 mg total) by mouth daily. Swallow whole. 120 tablet 12   clopidogrel (PLAVIX) 75 MG tablet Take 1 tablet (75 mg total) by mouth daily.  90 tablet 3   Multiple Vitamin (MULTIVITAMIN WITH MINERALS) TABS tablet Take 1 tablet by mouth daily. 130 tablet 1   thiamine (VITAMIN B1) 100 MG tablet Take 1 tablet (100 mg total) by mouth daily. 30 tablet 1   olmesartan (BENICAR) 20 MG tablet Take 1 tablet (20 mg total) by mouth daily. 90 tablet 3   atorvastatin (LIPITOR) 40 MG tablet Take 1 tablet (40 mg total) by mouth daily. 90 tablet 1   carvedilol (COREG) 3.125 MG tablet Take 1 tablet (3.125 mg total) by mouth 2 (two) times daily with a meal. 60 tablet 3   traZODone (DESYREL) 50 MG tablet Take 1 tablet (50 mg total) by mouth at bedtime as needed for sleep. 90 tablet 1   No facility-administered medications prior to visit.    Allergies  Allergen Reactions   Morphine And Codeine Hives and Other (See Comments)    Profuse sweating  Objective:    BP (!) 144/85   Pulse 61   Ht 5' (1.524 m)   Wt 173 lb (78.5 kg)   LMP 06/11/2020   SpO2 100%   BMI 33.79 kg/m  Wt Readings from Last 3 Encounters:  10/27/23 173 lb (78.5 kg)  09/17/23 173 lb 3.2 oz (78.6 kg)  08/06/23 188 lb 6.4 oz (85.5 kg)    Physical Exam Vitals and nursing note reviewed.  Constitutional:      Appearance: She is well-developed.  HENT:     Head: Normocephalic and atraumatic.  Cardiovascular:     Rate and Rhythm: Normal rate and regular rhythm.     Heart sounds: Normal heart sounds. No murmur heard.    No friction rub. No gallop.  Pulmonary:     Effort: Pulmonary effort is normal. No tachypnea or respiratory distress.     Breath sounds: Wheezing present. No decreased breath sounds, rhonchi or rales.  Chest:     Chest wall: No tenderness.  Abdominal:     General: Bowel sounds are normal.     Palpations: Abdomen is soft.  Musculoskeletal:        General: Normal range of motion.     Cervical back: Normal range of motion.  Skin:    General: Skin is warm and dry.  Neurological:     Mental Status: She is alert and oriented to person, place, and  time.     Coordination: Coordination normal.  Psychiatric:        Behavior: Behavior normal. Behavior is cooperative.        Thought Content: Thought content normal.        Judgment: Judgment normal.          Patient has been counseled extensively about nutrition and exercise as well as the importance of adherence with medications and regular follow-up. The patient was given clear instructions to go to ER or return to medical center if symptoms don't improve, worsen or new problems develop. The patient verbalized understanding.   Follow-up: No follow-ups on file.   Claiborne Rigg, FNP-BC [facility] [place], [place] [phone]   10/28/2023, 10:43 PM

## 2023-10-27 NOTE — Patient Instructions (Addendum)
[facility] [address], [place], [place] [address] Open ? Closes 5:30?PM Phone: [phone]

## 2023-10-28 ENCOUNTER — Encounter: Payer: Self-pay | Admitting: Nurse Practitioner

## 2023-10-30 ENCOUNTER — Other Ambulatory Visit: Payer: Self-pay

## 2023-11-02 ENCOUNTER — Other Ambulatory Visit (HOSPITAL_COMMUNITY): Payer: Self-pay

## 2023-11-26 ENCOUNTER — Other Ambulatory Visit: Payer: Self-pay | Admitting: Nurse Practitioner

## 2023-11-26 ENCOUNTER — Other Ambulatory Visit: Payer: Self-pay

## 2023-11-26 DIAGNOSIS — I10 Essential (primary) hypertension: Secondary | ICD-10-CM

## 2023-11-26 MED ORDER — CARVEDILOL 3.125 MG PO TABS
3.1250 mg | ORAL_TABLET | Freq: Two times a day (BID) | ORAL | 0 refills | Status: DC
  Filled 2023-11-26: qty 180, 90d supply, fill #0

## 2023-11-27 ENCOUNTER — Other Ambulatory Visit: Payer: Self-pay

## 2023-12-02 ENCOUNTER — Other Ambulatory Visit: Payer: Self-pay

## 2024-01-06 ENCOUNTER — Other Ambulatory Visit: Payer: Self-pay | Admitting: Family Medicine

## 2024-01-06 ENCOUNTER — Other Ambulatory Visit: Payer: Self-pay | Admitting: Nurse Practitioner

## 2024-01-06 DIAGNOSIS — I10 Essential (primary) hypertension: Secondary | ICD-10-CM

## 2024-01-07 ENCOUNTER — Other Ambulatory Visit: Payer: Self-pay

## 2024-01-07 MED ORDER — ACETAMINOPHEN 500 MG PO TABS
500.0000 mg | ORAL_TABLET | Freq: Four times a day (QID) | ORAL | 1 refills | Status: DC | PRN
  Filled 2024-01-07: qty 30, 8d supply, fill #0
  Filled 2024-01-24: qty 30, 8d supply, fill #1

## 2024-01-07 NOTE — Telephone Encounter (Signed)
 Rx 11/26/23 #180- too soon Requested Prescriptions  Pending Prescriptions Disp Refills   carvedilol (COREG) 3.125 MG tablet 180 tablet 0    Sig: Take 1 tablet (3.125 mg total) by mouth 2 (two) times daily with a meal.     Cardiovascular: Beta Blockers 3 Failed - 01/07/2024  1:18 PM      Failed - Last BP in normal range    BP Readings from Last 1 Encounters:  10/27/23 (!) 144/85         Passed - Cr in normal range and within 360 days    Creatinine, Ser  Date Value Ref Range Status  06/26/2023 0.90 0.44 - 1.00 mg/dL Final         Passed - AST in normal range and within 360 days    AST  Date Value Ref Range Status  06/26/2023 31 15 - 41 U/L Final         Passed - ALT in normal range and within 360 days    ALT  Date Value Ref Range Status  06/26/2023 20 0 - 44 U/L Final         Passed - Last Heart Rate in normal range    Pulse Readings from Last 1 Encounters:  10/27/23 61         Passed - Valid encounter within last 6 months    Recent Outpatient Visits           2 months ago Primary hypertension   [facility] [place]. [facility] Claiborne Rigg, NP   5 months ago Primary hypertension   [facility] [place]. [facility] Claiborne Rigg, NP       Future Appointments             In 2 weeks Claiborne Rigg, NP [facility]. [facility]

## 2024-01-11 ENCOUNTER — Other Ambulatory Visit: Payer: Self-pay

## 2024-01-11 ENCOUNTER — Other Ambulatory Visit: Payer: Self-pay | Admitting: Nurse Practitioner

## 2024-01-11 DIAGNOSIS — F5101 Primary insomnia: Secondary | ICD-10-CM

## 2024-01-11 DIAGNOSIS — E7800 Pure hypercholesterolemia, unspecified: Secondary | ICD-10-CM

## 2024-01-11 MED ORDER — CARVEDILOL 3.125 MG PO TABS
3.1250 mg | ORAL_TABLET | Freq: Two times a day (BID) | ORAL | 3 refills | Status: DC
  Filled 2024-01-24 – 2024-05-09 (×6): qty 60, 30d supply, fill #0
  Filled 2024-06-08: qty 60, 30d supply, fill #1
  Filled 2024-06-08: qty 60, 30d supply, fill #0
  Filled 2024-08-05 – 2024-08-16 (×2): qty 60, 30d supply, fill #1

## 2024-01-12 ENCOUNTER — Other Ambulatory Visit: Payer: Self-pay

## 2024-01-25 ENCOUNTER — Ambulatory Visit: Payer: Self-pay | Attending: Nurse Practitioner | Admitting: Nurse Practitioner

## 2024-01-25 ENCOUNTER — Other Ambulatory Visit: Payer: Self-pay

## 2024-01-25 ENCOUNTER — Encounter: Payer: Self-pay | Admitting: Nurse Practitioner

## 2024-01-25 VITALS — BP 126/78 | HR 61 | Ht 60.0 in | Wt 170.0 lb

## 2024-01-25 DIAGNOSIS — M2550 Pain in unspecified joint: Secondary | ICD-10-CM | POA: Diagnosis not present

## 2024-01-25 DIAGNOSIS — Z8673 Personal history of transient ischemic attack (TIA), and cerebral infarction without residual deficits: Secondary | ICD-10-CM

## 2024-01-25 DIAGNOSIS — I10 Essential (primary) hypertension: Secondary | ICD-10-CM | POA: Diagnosis not present

## 2024-01-25 DIAGNOSIS — E785 Hyperlipidemia, unspecified: Secondary | ICD-10-CM

## 2024-01-25 DIAGNOSIS — R7303 Prediabetes: Secondary | ICD-10-CM | POA: Diagnosis not present

## 2024-01-25 MED ORDER — GABAPENTIN 300 MG PO CAPS
300.0000 mg | ORAL_CAPSULE | Freq: Three times a day (TID) | ORAL | 3 refills | Status: AC
  Filled 2024-01-25 – 2024-05-09 (×3): qty 90, 30d supply, fill #0
  Filled 2024-06-08: qty 90, 30d supply, fill #1
  Filled 2024-06-08: qty 90, 30d supply, fill #0
  Filled 2024-11-10: qty 90, 30d supply, fill #1

## 2024-01-25 NOTE — Progress Notes (Signed)
 Assessment & Plan:  Taylor Black was seen today for medical management of chronic issues.  Diagnoses and all orders for this visit:  Primary hypertension -     CMP14+EGFR Continue all antihypertensives as prescribed.  Reminded to bring in blood pressure log for follow  up appointment.  RECOMMENDATIONS: DASH/Mediterranean Diets are healthier choices for HTN.    Prediabetes -     Hemoglobin A1c -     CMP14+EGFR Continue blood sugar control as discussed in office today, low carbohydrate diet, and regular physical exercise as tolerated, 150 minutes per week (30 min each day, 5 days per week, or 50 min 3 days per week). Keep blood sugar logs with fasting goal of 90-130 mg/dl, post prandial (after you eat) less than 180.  For Hypoglycemia: BS <60 and Hyperglycemia BS >400; contact the clinic ASAP. Annual eye exams and foot exams are recommended.   History of stroke -     Ambulatory referral to Neurology -     Ambulatory referral to Physical Therapy  Generalized joint pain -     gabapentin (NEURONTIN) 300 MG capsule; Take 1 capsule (300 mg total) by mouth 3 (three) times daily.  Hyperlipidemia, unspecified hyperlipidemia type -     Lipid panel INSTRUCTIONS: Work on a low fat, heart healthy diet and participate in regular aerobic exercise program by working out at least 150 minutes per week; 5 days a week-30 minutes per day. Avoid red meat/beef/steak,  fried foods. junk foods, sodas, sugary drinks, unhealthy snacking, alcohol and smoking.  Drink at least 80 oz of water per day and monitor your carbohydrate intake daily.      Patient has been counseled on age-appropriate routine health concerns for screening and prevention. These are reviewed and up-to-date. Referrals have been placed accordingly. Immunizations are up-to-date or declined.    Subjective:   Chief Complaint  Patient presents with   Medical Management of Chronic Issues    Taylor Black 52 y.o. female presents to  office today for follow up to HTN and requesting new referral to Neurology (she has a past medical history of left hemispheric infarct with residual deficits).  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    HTN Blood pressure is well controlled today.  She is currently prescribed carvedilol 3.125 mg twice daily and olmesartan 40 mg daily. BP Readings from Last 3 Encounters:  01/25/24 126/78  10/27/23 (!) 144/85  09/17/23 (!) 168/100      Since her stroke last year she has been experiencing gait abnormalities, short-term memory loss and sometimes difficulty word finding.  She ran into the dog gate 3 days ago and has bruising on the bilateral anterior thigh area today.  She does not currently use any assistive devices but does state she sometimes loses her balance.  She reports generalized pain specifically in the posterior cervical neck area bilateral shoulders, arms and legs with right greater than left.    Review of Systems  Constitutional:  Negative for fever, malaise/fatigue and weight loss.  HENT: Negative.  Negative for nosebleeds.   Eyes: Negative.  Negative for blurred vision, double vision and photophobia.  Respiratory: Negative.  Negative for cough and shortness of breath.   Cardiovascular: Negative.  Negative for chest pain, palpitations and leg swelling.  Gastrointestinal: Negative.  Negative for heartburn, nausea and vomiting.  Musculoskeletal:  Positive for joint pain, myalgias and neck pain.  Neurological: Negative.  Negative for dizziness, focal weakness, seizures and headaches.  Psychiatric/Behavioral:  Positive for memory loss. Negative for suicidal ideas.     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  06/13/2019   Chicken pox     Depression    GERD (gastroesophageal reflux disease)    LV dysfunction  06/13/2019    Past Surgical History:  Procedure Laterality Date   BIOPSY  05/29/2020   Procedure: BIOPSY;  Surgeon: Shellia Cleverly, DO;  Location: [facility];  Service: Gastroenterology;;   bowel obstruction  1989   COLONOSCOPY WITH PROPOFOL N/A 05/29/2020   Procedure: COLONOSCOPY WITH PROPOFOL;  Surgeon: Shellia Cleverly, DO;  Location: [facility];  Service: Gastroenterology;  Laterality: N/A;   ESOPHAGOGASTRODUODENOSCOPY (EGD) WITH PROPOFOL N/A 05/29/2020   Procedure: ESOPHAGOGASTRODUODENOSCOPY (EGD) WITH PROPOFOL;  Surgeon: Shellia Cleverly, DO;  Location: [facility];  Service: Gastroenterology;  Laterality: N/A;   LEFT HEART CATH AND CORONARY ANGIOGRAPHY N/A 06/13/2019   Procedure: LEFT HEART CATH AND CORONARY ANGIOGRAPHY;  Surgeon: Lyn Records, MD;  [facility];  Service: Cardiovascular;  Laterality: N/A;   POLYPECTOMY  05/29/2020   Procedure: POLYPECTOMY;  Surgeon: Shellia Cleverly, DO;  Location: [facility];  Service: Gastroenterology;;   TEE WITHOUT CARDIOVERSION N/A 06/29/2023   Procedure: TRANSESOPHAGEAL ECHOCARDIOGRAM;  Surgeon: Jake Bathe, MD;  [facility];  Service: Cardiovascular;  Laterality: N/A;   UMBILICAL HERNIA REPAIR  2017    Family History  Problem Relation Age of Onset   Heart disease Father    Stroke Father    Hypertension Father    Kidney disease Father    Diabetes Father    Alcohol abuse Father    Depression Paternal Grandmother    Kidney disease Paternal Grandmother    Cancer Maternal Grandmother        Cervical    Social History Reviewed with no changes to be made today.   Outpatient Medications Prior to Visit  Medication Sig Dispense Refill   acetaminophen (TYLENOL) 500 MG tablet Take 1 tablet (500 mg total) by mouth every 6 (six) hours as needed for moderate pain (pain score 4-6), headache or fever. 30 tablet 1   albuterol  (VENTOLIN HFA) 108 (90 Base) MCG/ACT inhaler Inhale 2 puffs into the lungs every 6 (six) hours as needed for wheezing or shortness of breath. 6.7 g 2   atorvastatin (LIPITOR) 40 MG tablet Take 1 tablet (40 mg total) by mouth daily. 90 tablet 1   carvedilol (COREG) 3.125 MG tablet Take 1 tablet (3.125 mg total) by mouth 2 (two) times daily with a meal. 60 tablet 3   clopidogrel (PLAVIX) 75 MG tablet Take 1 tablet (75 mg total) by mouth daily. 90 tablet 3   Multiple Vitamin (MULTIVITAMIN WITH MINERALS) TABS tablet Take 1 tablet by mouth daily. 130 tablet 1   olmesartan (BENICAR) 40 MG tablet Take 1 tablet (40 mg total) by mouth daily. 90 tablet 1   promethazine-dextromethorphan (PROMETHAZINE-DM) 6.25-15 MG/5ML syrup Take 5 mLs by mouth 4 (four) times daily as needed for cough. 240 mL 0   aspirin EC 81 MG tablet Take 1 tablet (81 mg total) by mouth daily. Swallow whole. 120 tablet 12   thiamine (VITAMIN B1) 100 MG tablet Take 1 tablet (100 mg total) by mouth daily. (Patient not taking: Reported  on 01/25/2024) 30 tablet 1   traZODone (DESYREL) 50 MG tablet Take 1 tablet (50 mg total) by mouth at bedtime as needed for sleep. 90 tablet 1   No facility-administered medications prior to visit.    Allergies  Allergen Reactions   Morphine And Codeine Hives and Other (See Comments)    Profuse sweating       Objective:    BP 126/78 (BP Location: Left Arm, Patient Position: Sitting, Cuff Size: Normal)   Pulse 61   Ht 5' (1.524 m)   Wt 170 lb (77.1 kg)   LMP 06/11/2020   BMI 33.20 kg/m  Wt Readings from Last 3 Encounters:  01/25/24 170 lb (77.1 kg)  10/27/23 173 lb (78.5 kg)  09/17/23 173 lb 3.2 oz (78.6 kg)    Physical Exam Vitals and nursing note reviewed.  Constitutional:      Appearance: She is well-developed.  HENT:     Head: Normocephalic and atraumatic.  Cardiovascular:     Rate and Rhythm: Normal rate and regular rhythm.     Heart sounds: Normal heart sounds. No murmur heard.     No friction rub. No gallop.  Pulmonary:     Effort: Pulmonary effort is normal. No tachypnea or respiratory distress.     Breath sounds: Normal breath sounds. No decreased breath sounds, wheezing, rhonchi or rales.  Chest:     Chest wall: No tenderness.  Abdominal:     General: Bowel sounds are normal.     Palpations: Abdomen is soft.  Musculoskeletal:        General: Normal range of motion.     Cervical back: Normal range of motion.  Skin:    General: Skin is warm and dry.  Neurological:     Mental Status: She is alert and oriented to person, place, and time.     Coordination: Coordination normal.  Psychiatric:        Behavior: Behavior normal. Behavior is cooperative.        Thought Content: Thought content normal.        Judgment: Judgment normal.          Patient has been counseled extensively about nutrition and exercise as well as the importance of adherence with medications and regular follow-up. The patient was given clear instructions to go to ER or return to medical center if symptoms don't improve, worsen or new problems develop. The patient verbalized understanding.   Follow-up: Return in about 3 months (around 04/26/2024).   Claiborne Rigg, FNP-BC [facility] and [facility] [place], [place] [phone]   01/25/2024, 1:25 PM

## 2024-01-25 NOTE — Patient Instructions (Signed)
 [facility] Located in: [facility] Address: [address], [place], [place] [address] Phone: [phone]

## 2024-01-26 ENCOUNTER — Other Ambulatory Visit: Payer: Self-pay

## 2024-01-26 LAB — CMP14+EGFR
ALT: 23 IU/L (ref 0–32)
AST: 26 IU/L (ref 0–40)
Albumin: 4.5 g/dL (ref 3.8–4.9)
Alkaline Phosphatase: 70 IU/L (ref 44–121)
BUN/Creatinine Ratio: 11 (ref 9–23)
BUN: 9 mg/dL (ref 6–24)
Bilirubin Total: 0.3 mg/dL (ref 0.0–1.2)
CO2: 21 mmol/L (ref 20–29)
Calcium: 9.1 mg/dL (ref 8.7–10.2)
Chloride: 107 mmol/L — ABNORMAL HIGH (ref 96–106)
Creatinine, Ser: 0.81 mg/dL (ref 0.57–1.00)
Globulin, Total: 2 g/dL (ref 1.5–4.5)
Glucose: 114 mg/dL — ABNORMAL HIGH (ref 70–99)
Potassium: 4.1 mmol/L (ref 3.5–5.2)
Sodium: 143 mmol/L (ref 134–144)
Total Protein: 6.5 g/dL (ref 6.0–8.5)
eGFR: 87 mL/min/{1.73_m2} (ref >59)

## 2024-01-26 LAB — LIPID PANEL
Chol/HDL Ratio: 2.3 ratio (ref 0.0–4.4)
Cholesterol, Total: 141 mg/dL (ref 100–199)
HDL: 62 mg/dL (ref >39)
LDL Chol Calc (NIH): 61 mg/dL (ref 0–99)
Triglycerides: 98 mg/dL (ref 0–149)
VLDL Cholesterol Cal: 18 mg/dL (ref 5–40)

## 2024-01-26 LAB — HEMOGLOBIN A1C
Est. average glucose Bld gHb Est-mCnc: 117 mg/dL
Hgb A1c MFr Bld: 5.7 % — ABNORMAL HIGH (ref 4.8–5.6)

## 2024-01-29 ENCOUNTER — Encounter: Payer: Self-pay | Admitting: Physician Assistant

## 2024-01-29 ENCOUNTER — Encounter: Payer: Self-pay | Admitting: Nurse Practitioner

## 2024-02-16 ENCOUNTER — Other Ambulatory Visit: Payer: Self-pay

## 2024-02-16 ENCOUNTER — Other Ambulatory Visit: Payer: Self-pay | Admitting: Nurse Practitioner

## 2024-02-16 DIAGNOSIS — J40 Bronchitis, not specified as acute or chronic: Secondary | ICD-10-CM

## 2024-02-16 MED ORDER — ALBUTEROL SULFATE HFA 108 (90 BASE) MCG/ACT IN AERS
2.0000 | INHALATION_SPRAY | Freq: Four times a day (QID) | RESPIRATORY_TRACT | 0 refills | Status: AC | PRN
  Filled 2024-02-16 – 2024-05-09 (×3): qty 18, 25d supply, fill #0

## 2024-02-17 ENCOUNTER — Other Ambulatory Visit: Payer: Self-pay

## 2024-02-17 MED ORDER — ACETAMINOPHEN 500 MG PO TABS
500.0000 mg | ORAL_TABLET | Freq: Four times a day (QID) | ORAL | 1 refills | Status: DC | PRN
  Filled 2024-02-17 – 2024-06-08 (×5): qty 30, 8d supply, fill #0
  Filled 2024-08-05: qty 30, 8d supply, fill #1
  Filled 2024-08-16: qty 30, 8d supply, fill #0

## 2024-02-17 MED ORDER — PROMETHAZINE-DM 6.25-15 MG/5ML PO SYRP
5.0000 mL | ORAL_SOLUTION | Freq: Four times a day (QID) | ORAL | 0 refills | Status: AC | PRN
  Filled 2024-02-17 – 2024-05-09 (×3): qty 240, 12d supply, fill #0

## 2024-02-25 ENCOUNTER — Other Ambulatory Visit: Payer: Self-pay

## 2024-02-26 ENCOUNTER — Other Ambulatory Visit: Payer: Self-pay

## 2024-02-27 NOTE — Progress Notes (Addendum)
 [facility] Clinic Note - Initial Visit   Date: 02/27/24  Taylor Black MRN: [id number] DOB: May 16, 1971   History of Acute Ischemic left hemisphere Infarcts: cryptogenic  CT head without acute abnormalities CTA head and neck without LVO or significant stenosis MRI brain, personally reviewed with Multiple infarcts within the LMCA territory and MCA/PCA watershed territory, possible additional small acute LACA territory infarct within the posterior L frontal lobe  s/p TNK.  Repeat CT s/p TNK without hemorrhage, stable 2 D echo EF  60-65 % /bubble no intracardiac source of emboli  TEE negative for acute abnormalities, trace MR, no shunt,  Not a candidate for loop recorder due to insurance LDL 98 A1C 6.1 S/p ASA and Plavix  daily  Holter monitor was negative Seen at [facility] as follow up She is here for second opinion.  Recommendations   Secondary stroke prevention Continue baby ASA daily and Plavix  Continue B1 supplements     Memory Impairment of unclear etiology  MoCA today 27/30. Memory changes may be due to contribution from stress and recent stroke.  MRI showed Multiple infarcts within the LMCA territory and MCA/PCA watershed territory, possible additional small acute LACA territory infarct within the posterior L frontal lobe without significant atrophy or volume loss. She is independent of her ADLs.  Mood is anxious with a possible component of depression.  Discussed to address these issues with her family doctor.  Continue to control mood as per PCP Recommend good control of cardiovascular risk factors Recommend psychotherapy and relaxation exercises for stress No indication for antidementia medication at this time  History of Present Illness:  Taylor Black is a 53 y.o. R-handed female  former pediatric nurse, with a history of HTN, HLD, DCM/NOCAD, former, former alcohol habituation, chronic pain syndrome, depression, anxiety,  GERD, Iron deficiency anemia,  presenting for second opinion evaluation of prior stroke on 06/26/23.  In review, she had presented to the ED with sudden onset of R sided weakness (LKN midnight prior to arrival). Daughter heard a thud and patient trying to call for help, garbled speech. CT head negative, per chart notes, exam was consistent with L hemispheric stroke, (See all the pertinent studies above).Patient  never had a similar episode. Denies any prior history of TIA. Denies vertigo. She has some  dizziness and nausea every morning. Denies vision changes. Denies headaches, dysarthria, but" the more tired I get I can't find my words, sometimes I stutter".  She has dysphagia with lettuce and onions. No seizures. Denies any chest pain, but does have palpitations. or shortness of breath, fever or chills, or night sweats. She no longer smokes   No  hormonal supplements. Does take a regular ASA a day, with no other antiplatelets or anticoagulants (was not on ASA prior to the event). Denies any recent long distance trips or recent surgeries. No sick contacts. Prior to the stroke she was caring for her children and grandchildren and "it was a lot". She has significant hardship. Her daughter is contributing at this time but the money is tight.  Family history of stroke on father.  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  06/13/2019   Chicken pox    Depression    GERD (gastroesophageal reflux disease)    LV dysfunction  06/13/2019    Past Surgical History:  Procedure Laterality Date  BIOPSY  05/29/2020   Procedure: BIOPSY;  Surgeon: Annis Kinder, DO;  Location: [facility];  Service: Gastroenterology;;   bowel obstruction  1989   COLONOSCOPY WITH PROPOFOL  N/A 05/29/2020   Procedure: COLONOSCOPY WITH PROPOFOL ;  Surgeon: Annis Kinder, DO;  Location: [facility];  Service: Gastroenterology;   Laterality: N/A;   ESOPHAGOGASTRODUODENOSCOPY (EGD) WITH PROPOFOL  N/A 05/29/2020   Procedure: ESOPHAGOGASTRODUODENOSCOPY (EGD) WITH PROPOFOL ;  Surgeon: Annis Kinder, DO;  [facility]: [facility];  Service: Gastroenterology;  Laterality: N/A;   LEFT HEART CATH AND CORONARY ANGIOGRAPHY N/A 06/13/2019   Procedure: LEFT HEART CATH AND CORONARY ANGIOGRAPHY;  Surgeon: Arty Binning, MD;  [facility];  Service: Cardiovascular;  Laterality: N/A;   POLYPECTOMY  05/29/2020   Procedure: POLYPECTOMY;  Surgeon: Annis Kinder, DO;  [facility]: [facility];  Service: Gastroenterology;;   TEE WITHOUT CARDIOVERSION N/A 06/29/2023   Procedure: TRANSESOPHAGEAL ECHOCARDIOGRAM;  Surgeon: Hugh Madura, MD;  [facility];  Service: Cardiovascular;  Laterality: N/A;   UMBILICAL HERNIA REPAIR  2017     Medications:  Outpatient Encounter Medications as of 02/29/2024  Medication Sig   acetaminophen  (TYLENOL ) 500 MG tablet Take 1 tablet (500 mg total) by mouth every 6 (six) hours as needed for moderate pain (pain score 4-6), headache or fever.   albuterol  (VENTOLIN  HFA) 108 (90 Base) MCG/ACT inhaler Inhale 2 puffs into the lungs every 6 (six) hours as needed for wheezing or shortness of breath.   albuterol  (VENTOLIN  HFA) 108 (90 Base) MCG/ACT inhaler Inhale 2 puffs into the lungs every 6 (six) hours as needed for wheezing or shortness of breath.   aspirin  EC 81 MG tablet Take 1 tablet (81 mg total) by mouth daily. Swallow whole.   atorvastatin  (LIPITOR) 40 MG tablet Take 1 tablet (40 mg total) by mouth daily.   carvedilol  (COREG ) 3.125 MG tablet Take 1 tablet (3.125 mg total) by mouth 2 (two) times daily with a meal.   clopidogrel  (PLAVIX ) 75 MG tablet Take 1 tablet (75 mg total) by mouth daily.   gabapentin  (NEURONTIN ) 300 MG capsule Take 1 capsule (300 mg total) by mouth 3 (three) times daily.   Multiple Vitamin (MULTIVITAMIN WITH MINERALS) TABS tablet Take 1 tablet by mouth  daily.   olmesartan  (BENICAR ) 40 MG tablet Take 1 tablet (40 mg total) by mouth daily.   promethazine -dextromethorphan (PROMETHAZINE -DM) 6.25-15 MG/5ML syrup Take 5 mLs by mouth 4 (four) times daily as needed for cough.   thiamine  (VITAMIN B1) 100 MG tablet Take 1 tablet (100 mg total) by mouth daily. (Patient not taking: Reported on 01/25/2024)   traZODone  (DESYREL ) 50 MG tablet Take 1 tablet (50 mg total) by mouth at bedtime as needed for sleep.   No facility-administered encounter medications on file as of 02/29/2024.    Allergies:  Allergies  Allergen Reactions   Morphine And Codeine Hives and Other (See Comments)    Profuse sweating    Family History: Family History  Problem Relation Age of Onset   Heart disease Father    Stroke Father    Hypertension Father    Kidney disease Father    Diabetes Father    Alcohol abuse Father    Depression Paternal Grandmother    Kidney disease Paternal Grandmother    Cancer Maternal Grandmother        Cervical    Social History: Social History   Tobacco Use   Smoking status: Former    Current packs/day: 0.00  Types: Cigarettes    Quit date: 06/26/2023    Years since quitting: 0.6   Smokeless tobacco: Never  Vaping Use   Vaping status: Never Used  Substance Use Topics   Alcohol use: Not Currently    Alcohol/week: 3.0 standard drinks of alcohol    Types: 3 Standard drinks or equivalent per week   Drug use: Yes    Types: Marijuana    Comment: CBD gummies   Social History   Social History Narrative   Not on file      02/29/2024   12:00 PM 08/06/2023    9:29 AM  Montreal Cognitive Assessment   Visuospatial/ Executive (0/5) 4 5  Naming (0/3) 3 3  Attention: Read list of digits (0/2) 2 1  Attention: Read list of letters (0/1) 1 1  Attention: Serial 7 subtraction starting at 100 (0/3) 2 1  Language: Repeat phrase (0/2) 1 2  Language : Fluency (0/1) 1 1  Abstraction (0/2) 2 2  Delayed Recall (0/5) 5 4  Orientation (0/6)  6 6  Total 27 26  Adjusted Score (based on education) 27 26      Vital Signs:  BP (!) 153/88   Pulse 63   Ht 5' (1.524 m)   Wt 155 lb (70.3 kg)   LMP 06/11/2020   SpO2 99%   BMI 30.27 kg/m     General Medical Exam:   General:  Well appearing, comfortable.   Eyes/ENT: see cranial nerve examination.   Neck:   No carotid bruits. Respiratory:  Clear to auscultation, good air entry bilaterally.   Cardiac:  Regular rate and rhythm, no murmur.   Extremities:  No deformities, edema, or skin discoloration.  Skin:  No rashes or lesions.  Neurological Exam: MENTAL STATUS including orientation to time, place, person, recent and remote memory, attention span and concentration, language, and fund of knowledge is appropriate.  Speech is not dysarthric.  CRANIAL NERVES: II:  No visual field defects.  Unremarkable fundi.   III-IV-VI: Pupils equal round and reactive to light.  Normal conjugate, extra-ocular eye movements in all directions of gaze.  No nystagmus.  No ptosis .   V:  Normal facial sensation.    VII:  Normal facial symmetry and movements.   VIII:  Normal hearing and vestibular function.   IX-X:  Normal palatal movement.   XI:  Normal shoulder shrug and head rotation.   XII:  Normal tongue strength and range of motion, no deviation or fasciculation.  MOTOR:  No atrophy, fasciculations or abnormal movements.  No pronator drift.    SENSORY:  Normal and symmetric perception of light touch, pinprick, vibration, and proprioception.  Romberg's sign absent.   COORDINATION/GAIT: Normal finger-to- nose-finger and heel-to-shin.  Intact rapid alternating movements bilaterally.  Able to rise from a chair without using arms.  Gait narrow based and stable. Tandem and stressed gait intact.     Total time spent:  45 mins   Thank you for allowing me to participate in patient's care.  If I can answer any additional questions, I would be pleased to do so.    Sincerely,   Tex Filbert,  PA-C

## 2024-02-29 ENCOUNTER — Ambulatory Visit: Admitting: Physician Assistant

## 2024-02-29 ENCOUNTER — Encounter: Payer: Self-pay | Admitting: Physician Assistant

## 2024-02-29 VITALS — BP 153/88 | HR 63 | Ht 60.0 in | Wt 155.0 lb

## 2024-02-29 DIAGNOSIS — I639 Cerebral infarction, unspecified: Secondary | ICD-10-CM | POA: Diagnosis not present

## 2024-02-29 DIAGNOSIS — Z711 Person with feared health complaint in whom no diagnosis is made: Secondary | ICD-10-CM | POA: Diagnosis not present

## 2024-02-29 NOTE — Patient Instructions (Addendum)
 Follow in 6 months Consider PT, stress reduction techniques  Agree with gabapentin  for pain

## 2024-04-20 ENCOUNTER — Other Ambulatory Visit: Payer: Self-pay | Admitting: Nurse Practitioner

## 2024-04-20 ENCOUNTER — Other Ambulatory Visit (HOSPITAL_COMMUNITY): Payer: Self-pay

## 2024-04-20 DIAGNOSIS — I10 Essential (primary) hypertension: Secondary | ICD-10-CM

## 2024-04-26 ENCOUNTER — Ambulatory Visit: Admitting: Nurse Practitioner

## 2024-05-04 ENCOUNTER — Other Ambulatory Visit: Payer: Self-pay

## 2024-05-04 ENCOUNTER — Encounter: Payer: Self-pay | Admitting: Pharmacist

## 2024-05-04 ENCOUNTER — Other Ambulatory Visit (HOSPITAL_COMMUNITY): Payer: Self-pay

## 2024-05-09 ENCOUNTER — Other Ambulatory Visit (HOSPITAL_COMMUNITY): Payer: Self-pay

## 2024-05-09 ENCOUNTER — Other Ambulatory Visit: Payer: Self-pay

## 2024-05-09 ENCOUNTER — Ambulatory Visit: Admitting: Family Medicine

## 2024-05-23 ENCOUNTER — Other Ambulatory Visit (HOSPITAL_COMMUNITY): Payer: Self-pay

## 2024-06-08 ENCOUNTER — Other Ambulatory Visit: Payer: Self-pay | Admitting: Nurse Practitioner

## 2024-06-08 ENCOUNTER — Other Ambulatory Visit: Payer: Self-pay

## 2024-06-08 DIAGNOSIS — E7800 Pure hypercholesterolemia, unspecified: Secondary | ICD-10-CM

## 2024-06-08 DIAGNOSIS — I10 Essential (primary) hypertension: Secondary | ICD-10-CM

## 2024-06-08 MED ORDER — ATORVASTATIN CALCIUM 40 MG PO TABS
40.0000 mg | ORAL_TABLET | Freq: Every day | ORAL | 0 refills | Status: DC
  Filled 2024-06-08 – 2024-08-16 (×3): qty 90, 90d supply, fill #0

## 2024-06-08 MED ORDER — OLMESARTAN MEDOXOMIL 40 MG PO TABS
40.0000 mg | ORAL_TABLET | Freq: Every day | ORAL | 0 refills | Status: DC
  Filled 2024-06-08 (×2): qty 90, 90d supply, fill #0

## 2024-06-10 ENCOUNTER — Other Ambulatory Visit: Payer: Self-pay

## 2024-08-05 ENCOUNTER — Other Ambulatory Visit: Payer: Self-pay

## 2024-08-05 ENCOUNTER — Other Ambulatory Visit: Payer: Self-pay | Admitting: Nurse Practitioner

## 2024-08-05 DIAGNOSIS — I639 Cerebral infarction, unspecified: Secondary | ICD-10-CM

## 2024-08-05 DIAGNOSIS — F5101 Primary insomnia: Secondary | ICD-10-CM

## 2024-08-05 DIAGNOSIS — I10 Essential (primary) hypertension: Secondary | ICD-10-CM

## 2024-08-05 MED ORDER — TRAZODONE HCL 50 MG PO TABS
50.0000 mg | ORAL_TABLET | Freq: Every evening | ORAL | 1 refills | Status: AC | PRN
  Filled 2024-08-05 – 2024-08-16 (×2): qty 90, 90d supply, fill #0
  Filled 2024-11-10: qty 90, 90d supply, fill #1

## 2024-08-16 ENCOUNTER — Other Ambulatory Visit: Payer: Self-pay | Admitting: Neurology

## 2024-08-16 ENCOUNTER — Other Ambulatory Visit: Payer: Self-pay

## 2024-08-16 ENCOUNTER — Other Ambulatory Visit (HOSPITAL_COMMUNITY): Payer: Self-pay

## 2024-08-16 DIAGNOSIS — I639 Cerebral infarction, unspecified: Secondary | ICD-10-CM

## 2024-08-17 ENCOUNTER — Other Ambulatory Visit (HOSPITAL_COMMUNITY): Payer: Self-pay

## 2024-08-17 ENCOUNTER — Other Ambulatory Visit: Payer: Self-pay

## 2024-08-17 MED ORDER — CLOPIDOGREL BISULFATE 75 MG PO TABS
75.0000 mg | ORAL_TABLET | Freq: Every day | ORAL | 3 refills | Status: AC
  Filled 2024-08-17: qty 90, 90d supply, fill #0
  Filled 2024-11-10: qty 90, 90d supply, fill #1

## 2024-08-25 ENCOUNTER — Other Ambulatory Visit: Payer: Self-pay

## 2024-08-30 ENCOUNTER — Ambulatory Visit: Admitting: Physician Assistant

## 2024-11-10 ENCOUNTER — Other Ambulatory Visit: Payer: Self-pay | Admitting: Nurse Practitioner

## 2024-11-10 ENCOUNTER — Other Ambulatory Visit: Payer: Self-pay

## 2024-11-10 DIAGNOSIS — I10 Essential (primary) hypertension: Secondary | ICD-10-CM

## 2024-11-10 DIAGNOSIS — E7800 Pure hypercholesterolemia, unspecified: Secondary | ICD-10-CM

## 2024-11-10 MED ORDER — ACETAMINOPHEN 500 MG PO TABS
500.0000 mg | ORAL_TABLET | Freq: Four times a day (QID) | ORAL | 0 refills | Status: AC | PRN
  Filled 2024-11-10: qty 30, 8d supply, fill #0

## 2024-11-10 MED ORDER — ATORVASTATIN CALCIUM 40 MG PO TABS
40.0000 mg | ORAL_TABLET | Freq: Every day | ORAL | 0 refills | Status: AC
  Filled 2024-11-10: qty 30, 30d supply, fill #0

## 2024-11-10 MED ORDER — ADULT MULTIVITAMIN W/MINERALS CH
1.0000 | ORAL_TABLET | Freq: Every day | ORAL | 0 refills | Status: AC
  Filled 2024-11-10: qty 30, 30d supply, fill #0

## 2024-11-10 MED ORDER — OLMESARTAN MEDOXOMIL 40 MG PO TABS
40.0000 mg | ORAL_TABLET | Freq: Every day | ORAL | 0 refills | Status: AC
  Filled 2024-11-10: qty 30, 30d supply, fill #0

## 2024-11-10 MED ORDER — CARVEDILOL 3.125 MG PO TABS
3.1250 mg | ORAL_TABLET | Freq: Two times a day (BID) | ORAL | 0 refills | Status: AC
  Filled 2024-11-10: qty 60, 30d supply, fill #0

## 2024-11-11 ENCOUNTER — Other Ambulatory Visit: Payer: Self-pay

## 2024-11-14 ENCOUNTER — Other Ambulatory Visit: Payer: Self-pay

## 2024-11-17 ENCOUNTER — Other Ambulatory Visit: Payer: Self-pay
